# Patient Record
Sex: Male | Born: 1935 | Race: White | Hispanic: No | Marital: Single
Health system: Southern US, Community
[De-identification: ages and names within clinical notes are randomized; demographics above are authoritative.]

## PROBLEM LIST (undated history)

## (undated) DIAGNOSIS — G8929 Other chronic pain: Secondary | ICD-10-CM

## (undated) DIAGNOSIS — I509 Heart failure, unspecified: Secondary | ICD-10-CM

## (undated) DIAGNOSIS — D649 Anemia, unspecified: Secondary | ICD-10-CM

## (undated) DIAGNOSIS — Z8709 Personal history of other diseases of the respiratory system: Secondary | ICD-10-CM

## (undated) DIAGNOSIS — R1310 Dysphagia, unspecified: Secondary | ICD-10-CM

## (undated) DIAGNOSIS — E875 Hyperkalemia: Secondary | ICD-10-CM

## (undated) DIAGNOSIS — I6990 Unspecified sequelae of unspecified cerebrovascular disease: Secondary | ICD-10-CM

## (undated) DIAGNOSIS — J189 Pneumonia, unspecified organism: Secondary | ICD-10-CM

## (undated) DIAGNOSIS — M25561 Pain in right knee: Secondary | ICD-10-CM

## (undated) DIAGNOSIS — Z86718 Personal history of other venous thrombosis and embolism: Secondary | ICD-10-CM

## (undated) DIAGNOSIS — I10 Essential (primary) hypertension: Secondary | ICD-10-CM

## (undated) DIAGNOSIS — E46 Unspecified protein-calorie malnutrition: Secondary | ICD-10-CM

## (undated) DIAGNOSIS — H9193 Unspecified hearing loss, bilateral: Secondary | ICD-10-CM

## (undated) DIAGNOSIS — F1011 Alcohol abuse, in remission: Secondary | ICD-10-CM

## (undated) DIAGNOSIS — M25562 Pain in left knee: Secondary | ICD-10-CM

## (undated) DIAGNOSIS — N189 Chronic kidney disease, unspecified: Secondary | ICD-10-CM

## (undated) DIAGNOSIS — F0390 Unspecified dementia without behavioral disturbance: Secondary | ICD-10-CM

## (undated) DIAGNOSIS — I639 Cerebral infarction, unspecified: Secondary | ICD-10-CM

## (undated) DIAGNOSIS — I82409 Acute embolism and thrombosis of unspecified deep veins of unspecified lower extremity: Secondary | ICD-10-CM

## (undated) HISTORY — DX: Pain in left knee: M25.562

## (undated) HISTORY — DX: Pain in right knee: M25.561

## (undated) HISTORY — DX: Acute embolism and thrombosis of unspecified deep veins of unspecified lower extremity: I82.409

## (undated) HISTORY — DX: Unspecified dementia, unspecified severity, without behavioral disturbance, psychotic disturbance, mood disturbance, and anxiety: F03.90

## (undated) HISTORY — DX: Essential (primary) hypertension: I10

## (undated) HISTORY — DX: Cerebral infarction, unspecified: I63.9

## (undated) HISTORY — DX: Alcohol abuse, in remission: F10.11

## (undated) HISTORY — DX: Heart failure, unspecified: I50.9

## (undated) HISTORY — DX: Anemia, unspecified: D64.9

## (undated) HISTORY — DX: Personal history of other venous thrombosis and embolism: Z86.718

## (undated) HISTORY — DX: Other chronic pain: G89.29

## (undated) HISTORY — DX: Unspecified protein-calorie malnutrition: E46

## (undated) HISTORY — DX: Personal history of other diseases of the respiratory system: Z87.09

## (undated) HISTORY — DX: Unspecified sequelae of unspecified cerebrovascular disease: I69.90

## (undated) HISTORY — DX: Unspecified hearing loss, bilateral: H91.93

## (undated) HISTORY — DX: Pneumonia, unspecified organism: J18.9

## (undated) HISTORY — DX: Hyperkalemia: E87.5

## (undated) HISTORY — DX: Chronic kidney disease, unspecified: N18.9

## (undated) HISTORY — DX: Dysphagia, unspecified: R13.10

---

## 2009-01-03 ENCOUNTER — Ambulatory Visit: Payer: Self-pay | Admitting: *Deleted

## 2009-06-22 ENCOUNTER — Ambulatory Visit: Payer: Self-pay | Admitting: Nephrology

## 2009-07-06 ENCOUNTER — Ambulatory Visit: Payer: Self-pay | Admitting: Nephrology

## 2009-08-29 ENCOUNTER — Inpatient Hospital Stay: Payer: Self-pay | Admitting: Internal Medicine

## 2013-05-30 ENCOUNTER — Inpatient Hospital Stay: Payer: Self-pay | Admitting: Internal Medicine

## 2013-05-30 DIAGNOSIS — R000 Tachycardia, unspecified: Secondary | ICD-10-CM

## 2013-05-30 LAB — COMPREHENSIVE METABOLIC PANEL
Alkaline Phosphatase: 79 U/L (ref 50–136)
Anion Gap: 2 — ABNORMAL LOW (ref 7–16)
Calcium, Total: 8.2 mg/dL — ABNORMAL LOW (ref 8.5–10.1)
Co2: 28 mmol/L (ref 21–32)
Creatinine: 2.53 mg/dL — ABNORMAL HIGH (ref 0.60–1.30)
EGFR (African American): 26 — ABNORMAL LOW
Glucose: 82 mg/dL (ref 65–99)
Potassium: 4.2 mmol/L (ref 3.5–5.1)
SGOT(AST): 19 U/L (ref 15–37)
SGPT (ALT): 21 U/L (ref 12–78)

## 2013-05-30 LAB — CBC
MCHC: 31.9 g/dL — ABNORMAL LOW (ref 32.0–36.0)
Platelet: 183 10*3/uL (ref 150–440)
RBC: 3.78 10*6/uL — ABNORMAL LOW (ref 4.40–5.90)
RDW: 15.3 % — ABNORMAL HIGH (ref 11.5–14.5)

## 2013-05-30 LAB — MAGNESIUM: Magnesium: 1.8 mg/dL

## 2013-05-30 LAB — TROPONIN I: Troponin-I: <0.02 ng/mL

## 2013-05-30 LAB — AMMONIA: Ammonia, Plasma: <25 mcmol/L (ref 11–32)

## 2013-05-30 LAB — CK-MB: CK-MB: 1.1 ng/mL (ref 0.5–3.6)

## 2013-05-31 LAB — DRUG SCREEN, URINE
Amphetamines, Ur Screen: NEGATIVE
Barbiturates, Ur Screen: NEGATIVE
Benzodiazepine, Ur Scrn: POSITIVE
Cannabinoid 50 Ng, Ur ~~LOC~~: NEGATIVE
Cocaine Metabolite,Ur ~~LOC~~: NEGATIVE
MDMA (Ecstasy)Ur Screen: NEGATIVE
Methadone, Ur Screen: NEGATIVE
Opiate, Ur Screen: NEGATIVE
Phencyclidine (PCP) Ur S: NEGATIVE
Tricyclic, Ur Screen: NEGATIVE

## 2013-05-31 LAB — URINALYSIS, COMPLETE
Bacteria: NONE SEEN
Bilirubin,UR: NEGATIVE
Glucose,UR: NEGATIVE mg/dL
Ketone: NEGATIVE
Leukocyte Esterase: NEGATIVE
Nitrite: NEGATIVE
Ph: 5
Protein: 30
RBC,UR: 13 /HPF
Specific Gravity: 1.014
Squamous Epithelial: <1
WBC UR: 2 /HPF

## 2013-05-31 LAB — BASIC METABOLIC PANEL WITH GFR
Anion Gap: 2 — ABNORMAL LOW
BUN: 34 mg/dL — ABNORMAL HIGH
Calcium, Total: 8 mg/dL — ABNORMAL LOW
Chloride: 114 mmol/L — ABNORMAL HIGH
Co2: 28 mmol/L
Creatinine: 2.28 mg/dL — ABNORMAL HIGH
EGFR (African American): 29 — ABNORMAL LOW
EGFR (Non-African Amer.): 25 — ABNORMAL LOW
Glucose: 79 mg/dL
Osmolality: 293
Potassium: 4.4 mmol/L
Sodium: 144 mmol/L

## 2013-05-31 LAB — TSH: Thyroid Stimulating Horm: 0.368 u[IU]/mL — ABNORMAL LOW

## 2013-05-31 LAB — CBC WITH DIFFERENTIAL/PLATELET
Basophil #: 0 10*3/uL (ref 0.0–0.1)
HCT: 33.3 % — ABNORMAL LOW (ref 40.0–52.0)
Lymphocyte #: 1.3 10*3/uL (ref 1.0–3.6)
Lymphocyte %: 21.6 %
MCH: 28.2 pg (ref 26.0–34.0)
MCHC: 32.3 g/dL (ref 32.0–36.0)
Monocyte %: 10.2 %
Neutrophil %: 63.8 %
RDW: 15.1 % — ABNORMAL HIGH (ref 11.5–14.5)
WBC: 6.1 10*3/uL (ref 3.8–10.6)

## 2013-05-31 LAB — LIPID PANEL
Cholesterol: 148 mg/dL
HDL Cholesterol: 39 mg/dL — ABNORMAL LOW
Ldl Cholesterol, Calc: 83 mg/dL
Triglycerides: 132 mg/dL
VLDL Cholesterol, Calc: 26 mg/dL

## 2013-06-01 DIAGNOSIS — I359 Nonrheumatic aortic valve disorder, unspecified: Secondary | ICD-10-CM

## 2013-06-01 LAB — BASIC METABOLIC PANEL
Anion Gap: 7 (ref 7–16)
BUN: 31 mg/dL — ABNORMAL HIGH (ref 7–18)
Calcium, Total: 8.1 mg/dL — ABNORMAL LOW (ref 8.5–10.1)
Chloride: 113 mmol/L — ABNORMAL HIGH (ref 98–107)
Glucose: 158 mg/dL — ABNORMAL HIGH (ref 65–99)
Osmolality: 293 (ref 275–301)
Sodium: 142 mmol/L (ref 136–145)

## 2013-06-04 LAB — CULTURE, BLOOD (SINGLE)

## 2013-10-14 ENCOUNTER — Ambulatory Visit: Payer: Self-pay | Admitting: Hospice and Palliative Medicine

## 2013-10-21 ENCOUNTER — Inpatient Hospital Stay: Payer: Self-pay | Admitting: Internal Medicine

## 2013-10-21 LAB — COMPREHENSIVE METABOLIC PANEL
ALBUMIN: 2.9 g/dL — AB (ref 3.4–5.0)
ALT: 21 U/L (ref 12–78)
Alkaline Phosphatase: 81 U/L
Anion Gap: 5 — ABNORMAL LOW (ref 7–16)
BILIRUBIN TOTAL: 0.3 mg/dL (ref 0.2–1.0)
BUN: 32 mg/dL — AB (ref 7–18)
CALCIUM: 8.1 mg/dL — AB (ref 8.5–10.1)
CHLORIDE: 114 mmol/L — AB (ref 98–107)
CO2: 25 mmol/L (ref 21–32)
CREATININE: 2.45 mg/dL — AB (ref 0.60–1.30)
EGFR (African American): 27 — ABNORMAL LOW
GFR CALC NON AF AMER: 23 — AB
Glucose: 99 mg/dL (ref 65–99)
Osmolality: 294 (ref 275–301)
POTASSIUM: 4.5 mmol/L (ref 3.5–5.1)
SGOT(AST): 25 U/L (ref 15–37)
Sodium: 144 mmol/L (ref 136–145)
Total Protein: 6.7 g/dL (ref 6.4–8.2)

## 2013-10-21 LAB — URINALYSIS, COMPLETE
Bilirubin,UR: NEGATIVE
Glucose,UR: NEGATIVE mg/dL (ref 0–75)
KETONE: NEGATIVE
Leukocyte Esterase: NEGATIVE
NITRITE: NEGATIVE
PH: 5 (ref 4.5–8.0)
Protein: 100
Specific Gravity: 1.018 (ref 1.003–1.030)
Squamous Epithelial: 1

## 2013-10-21 LAB — CBC WITH DIFFERENTIAL/PLATELET
Basophil #: 0 10*3/uL (ref 0.0–0.1)
Basophil %: 0.3 %
EOS PCT: 0 %
Eosinophil #: 0 10*3/uL (ref 0.0–0.7)
HCT: 36.9 % — AB (ref 40.0–52.0)
HGB: 12 g/dL — ABNORMAL LOW (ref 13.0–18.0)
Lymphocyte #: 1 10*3/uL (ref 1.0–3.6)
Lymphocyte %: 11.1 %
MCH: 29.8 pg (ref 26.0–34.0)
MCHC: 32.7 g/dL (ref 32.0–36.0)
MCV: 91 fL (ref 80–100)
MONO ABS: 0.9 x10 3/mm (ref 0.2–1.0)
Monocyte %: 10.3 %
NEUTROS ABS: 6.8 10*3/uL — AB (ref 1.4–6.5)
Neutrophil %: 78.3 %
PLATELETS: 138 10*3/uL — AB (ref 150–440)
RBC: 4.04 10*6/uL — ABNORMAL LOW (ref 4.40–5.90)
RDW: 19.9 % — AB (ref 11.5–14.5)
WBC: 8.7 10*3/uL (ref 3.8–10.6)

## 2013-10-21 LAB — MAGNESIUM: Magnesium: 1.8 mg/dL

## 2013-10-21 LAB — PROTIME-INR
INR: 1.1
Prothrombin Time: 13.7 secs (ref 11.5–14.7)

## 2013-10-21 LAB — TROPONIN I: Troponin-I: <0.02 ng/mL

## 2013-10-21 LAB — PHOSPHORUS: Phosphorus: 3.5 mg/dL (ref 2.5–4.9)

## 2013-10-22 LAB — CBC WITH DIFFERENTIAL/PLATELET
Basophil #: 0 10*3/uL (ref 0.0–0.1)
Basophil %: 0.5 %
EOS ABS: 0 10*3/uL (ref 0.0–0.7)
Eosinophil %: 0.1 %
HCT: 33 % — AB (ref 40.0–52.0)
HGB: 10.6 g/dL — AB (ref 13.0–18.0)
LYMPHS ABS: 1.1 10*3/uL (ref 1.0–3.6)
LYMPHS PCT: 19.6 %
MCH: 29.6 pg (ref 26.0–34.0)
MCHC: 32.2 g/dL (ref 32.0–36.0)
MCV: 92 fL (ref 80–100)
MONOS PCT: 10.4 %
Monocyte #: 0.6 x10 3/mm (ref 0.2–1.0)
NEUTROS PCT: 69.4 %
Neutrophil #: 4 10*3/uL (ref 1.4–6.5)
Platelet: 106 10*3/uL — ABNORMAL LOW (ref 150–440)
RBC: 3.59 10*6/uL — ABNORMAL LOW (ref 4.40–5.90)
RDW: 20.3 % — ABNORMAL HIGH (ref 11.5–14.5)
WBC: 5.8 10*3/uL (ref 3.8–10.6)

## 2013-10-22 LAB — COMPREHENSIVE METABOLIC PANEL
ANION GAP: 6 — AB (ref 7–16)
Albumin: 2.5 g/dL — ABNORMAL LOW (ref 3.4–5.0)
Alkaline Phosphatase: 66 U/L
BILIRUBIN TOTAL: 0.3 mg/dL (ref 0.2–1.0)
BUN: 32 mg/dL — AB (ref 7–18)
CALCIUM: 7.6 mg/dL — AB (ref 8.5–10.1)
CO2: 22 mmol/L (ref 21–32)
Chloride: 114 mmol/L — ABNORMAL HIGH (ref 98–107)
Creatinine: 2.3 mg/dL — ABNORMAL HIGH (ref 0.60–1.30)
EGFR (Non-African Amer.): 25 — ABNORMAL LOW
GFR CALC AF AMER: 29 — AB
GLUCOSE: 80 mg/dL (ref 65–99)
Osmolality: 289 (ref 275–301)
Potassium: 4.5 mmol/L (ref 3.5–5.1)
SGOT(AST): 14 U/L — ABNORMAL LOW (ref 15–37)
SGPT (ALT): 16 U/L (ref 12–78)
SODIUM: 142 mmol/L (ref 136–145)
Total Protein: 5.7 g/dL — ABNORMAL LOW (ref 6.4–8.2)

## 2013-10-22 LAB — MAGNESIUM: Magnesium: 1.8 mg/dL

## 2013-10-22 LAB — PRO B NATRIURETIC PEPTIDE: B-TYPE NATIURETIC PEPTID: 3199 pg/mL — AB (ref 0–450)

## 2013-10-23 LAB — URINE CULTURE

## 2013-10-24 LAB — BASIC METABOLIC PANEL
Anion Gap: 6 — ABNORMAL LOW (ref 7–16)
BUN: 41 mg/dL — AB (ref 7–18)
CO2: 24 mmol/L (ref 21–32)
CREATININE: 2.34 mg/dL — AB (ref 0.60–1.30)
Calcium, Total: 8.5 mg/dL (ref 8.5–10.1)
Chloride: 117 mmol/L — ABNORMAL HIGH (ref 98–107)
EGFR (Non-African Amer.): 24 — ABNORMAL LOW
GFR CALC AF AMER: 28 — AB
Glucose: 95 mg/dL (ref 65–99)
Osmolality: 302 (ref 275–301)
POTASSIUM: 4.4 mmol/L (ref 3.5–5.1)
Sodium: 147 mmol/L — ABNORMAL HIGH (ref 136–145)

## 2013-10-24 LAB — WBC: WBC: 6 10*3/uL (ref 3.8–10.6)

## 2013-10-25 LAB — PLATELET COUNT: PLATELETS: 170 10*3/uL (ref 150–440)

## 2013-10-26 ENCOUNTER — Inpatient Hospital Stay
Admission: AD | Admit: 2013-10-26 | Discharge: 2013-11-16 | Disposition: A | Discharge status: Skilled Nursing Facility | Payer: Self-pay | Source: Ambulatory Visit | Attending: Internal Medicine | Admitting: Internal Medicine

## 2013-10-26 ENCOUNTER — Ambulatory Visit (HOSPITAL_COMMUNITY)
Admission: AD | Admit: 2013-10-26 | Discharge: 2013-10-26 | Disposition: A | Discharge status: Home / Self Care | Payer: Medicare Other | Source: Other Acute Inpatient Hospital | Attending: Internal Medicine | Admitting: Internal Medicine

## 2013-10-26 DIAGNOSIS — N179 Acute kidney failure, unspecified: Secondary | ICD-10-CM | POA: Diagnosis present

## 2013-10-26 LAB — CULTURE, BLOOD (SINGLE)

## 2013-10-27 ENCOUNTER — Other Ambulatory Visit (HOSPITAL_COMMUNITY): Payer: Self-pay

## 2013-10-27 LAB — COMPREHENSIVE METABOLIC PANEL
ALT: 9 U/L (ref 0–53)
AST: 11 U/L (ref 0–37)
Albumin: 2.6 g/dL — ABNORMAL LOW (ref 3.5–5.2)
Alkaline Phosphatase: 63 U/L (ref 39–117)
BUN: 70 mg/dL — ABNORMAL HIGH (ref 6–23)
CALCIUM: 8.5 mg/dL (ref 8.4–10.5)
CO2: 26 mEq/L (ref 19–32)
Chloride: 111 mEq/L (ref 96–112)
Creatinine, Ser: 3.05 mg/dL — ABNORMAL HIGH (ref 0.50–1.35)
GFR calc non Af Amer: 17 mL/min — ABNORMAL LOW (ref >90)
GFR, EST AFRICAN AMERICAN: 20 mL/min — AB (ref >90)
Glucose, Bld: 107 mg/dL — ABNORMAL HIGH (ref 70–99)
Potassium: 4.7 mEq/L (ref 3.7–5.3)
SODIUM: 150 meq/L — AB (ref 137–147)
TOTAL PROTEIN: 6.1 g/dL (ref 6.0–8.3)
Total Bilirubin: <0.2 mg/dL — ABNORMAL LOW (ref 0.3–1.2)

## 2013-10-27 LAB — CBC
HCT: 35.5 % — ABNORMAL LOW (ref 39.0–52.0)
Hemoglobin: 11.4 g/dL — ABNORMAL LOW (ref 13.0–17.0)
MCH: 29.5 pg (ref 26.0–34.0)
MCHC: 32.1 g/dL (ref 30.0–36.0)
MCV: 91.7 fL (ref 78.0–100.0)
Platelets: 188 10*3/uL (ref 150–400)
RBC: 3.87 MIL/uL — ABNORMAL LOW (ref 4.22–5.81)
RDW: 18.8 % — ABNORMAL HIGH (ref 11.5–15.5)
WBC: 6.9 10*3/uL (ref 4.0–10.5)

## 2013-10-27 LAB — TSH: TSH: 0.319 u[IU]/mL — ABNORMAL LOW (ref 0.350–4.500)

## 2013-10-28 ENCOUNTER — Other Ambulatory Visit (HOSPITAL_COMMUNITY): Payer: Self-pay

## 2013-10-28 LAB — CBC WITH DIFFERENTIAL/PLATELET
BASOS ABS: 0 10*3/uL (ref 0.0–0.1)
BASOS PCT: 0 % (ref 0–1)
EOS PCT: 3 % (ref 0–5)
Eosinophils Absolute: 0.2 10*3/uL (ref 0.0–0.7)
HCT: 36.1 % — ABNORMAL LOW (ref 39.0–52.0)
HEMOGLOBIN: 11.5 g/dL — AB (ref 13.0–17.0)
Lymphocytes Relative: 26 % (ref 12–46)
Lymphs Abs: 1.9 10*3/uL (ref 0.7–4.0)
MCH: 29.1 pg (ref 26.0–34.0)
MCHC: 31.9 g/dL (ref 30.0–36.0)
MCV: 91.4 fL (ref 78.0–100.0)
Monocytes Absolute: 0.9 10*3/uL (ref 0.1–1.0)
Monocytes Relative: 12 % (ref 3–12)
Neutro Abs: 4.2 10*3/uL (ref 1.7–7.7)
Neutrophils Relative %: 59 % (ref 43–77)
PLATELETS: 197 10*3/uL (ref 150–400)
RBC: 3.95 MIL/uL — AB (ref 4.22–5.81)
RDW: 18.4 % — ABNORMAL HIGH (ref 11.5–15.5)
WBC: 7.2 10*3/uL (ref 4.0–10.5)

## 2013-10-28 LAB — VITAMIN B12: Vitamin B-12: 304 pg/mL (ref 211–911)

## 2013-10-28 LAB — MAGNESIUM: MAGNESIUM: 2.1 mg/dL (ref 1.5–2.5)

## 2013-10-28 LAB — BASIC METABOLIC PANEL
BUN: 79 mg/dL — ABNORMAL HIGH (ref 6–23)
CALCIUM: 8.7 mg/dL (ref 8.4–10.5)
CO2: 24 mEq/L (ref 19–32)
CREATININE: 3.09 mg/dL — AB (ref 0.50–1.35)
Chloride: 105 mEq/L (ref 96–112)
GFR, EST AFRICAN AMERICAN: 20 mL/min — AB (ref >90)
GFR, EST NON AFRICAN AMERICAN: 17 mL/min — AB (ref >90)
Glucose, Bld: 94 mg/dL (ref 70–99)
Potassium: 4.2 mEq/L (ref 3.7–5.3)
SODIUM: 146 meq/L (ref 137–147)

## 2013-10-28 LAB — PHOSPHORUS: Phosphorus: 4.2 mg/dL (ref 2.3–4.6)

## 2013-10-28 LAB — FERRITIN: FERRITIN: 76 ng/mL (ref 22–322)

## 2013-10-28 LAB — FOLATE RBC: RBC Folate: 672 ng/mL — ABNORMAL HIGH (ref >280)

## 2013-10-28 LAB — T4, FREE: Free T4: 1.41 ng/dL (ref 0.80–1.80)

## 2013-10-28 LAB — TSH: TSH: 0.252 u[IU]/mL — ABNORMAL LOW (ref 0.350–4.500)

## 2013-10-28 LAB — PREALBUMIN: Prealbumin: 14 mg/dL — ABNORMAL LOW (ref 17.0–34.0)

## 2013-10-28 LAB — PRO B NATRIURETIC PEPTIDE: Pro B Natriuretic peptide (BNP): 2378 pg/mL — ABNORMAL HIGH (ref 0–450)

## 2013-10-30 LAB — CBC
HEMATOCRIT: 37.9 % — AB (ref 39.0–52.0)
Hemoglobin: 11.9 g/dL — ABNORMAL LOW (ref 13.0–17.0)
MCH: 28.7 pg (ref 26.0–34.0)
MCHC: 31.4 g/dL (ref 30.0–36.0)
MCV: 91.5 fL (ref 78.0–100.0)
Platelets: 209 10*3/uL (ref 150–400)
RBC: 4.14 MIL/uL — AB (ref 4.22–5.81)
RDW: 18.4 % — ABNORMAL HIGH (ref 11.5–15.5)
WBC: 7.6 10*3/uL (ref 4.0–10.5)

## 2013-10-30 LAB — BASIC METABOLIC PANEL
BUN: 93 mg/dL — AB (ref 6–23)
CO2: 26 meq/L (ref 19–32)
Calcium: 8.9 mg/dL (ref 8.4–10.5)
Chloride: 104 mEq/L (ref 96–112)
Creatinine, Ser: 3.74 mg/dL — ABNORMAL HIGH (ref 0.50–1.35)
GFR calc Af Amer: 16 mL/min — ABNORMAL LOW (ref >90)
GFR, EST NON AFRICAN AMERICAN: 13 mL/min — AB (ref >90)
GLUCOSE: 93 mg/dL (ref 70–99)
Potassium: 5.1 mEq/L (ref 3.7–5.3)
Sodium: 145 mEq/L (ref 137–147)

## 2013-10-31 LAB — BASIC METABOLIC PANEL
BUN: 92 mg/dL — ABNORMAL HIGH (ref 6–23)
CO2: 25 mEq/L (ref 19–32)
CREATININE: 3.74 mg/dL — AB (ref 0.50–1.35)
Calcium: 8.8 mg/dL (ref 8.4–10.5)
Chloride: 104 mEq/L (ref 96–112)
GFR, EST AFRICAN AMERICAN: 16 mL/min — AB (ref >90)
GFR, EST NON AFRICAN AMERICAN: 13 mL/min — AB (ref >90)
GLUCOSE: 91 mg/dL (ref 70–99)
Potassium: 5.8 mEq/L — ABNORMAL HIGH (ref 3.7–5.3)
Sodium: 144 mEq/L (ref 137–147)

## 2013-11-01 LAB — URINALYSIS, ROUTINE W REFLEX MICROSCOPIC
BILIRUBIN URINE: NEGATIVE
GLUCOSE, UA: NEGATIVE mg/dL
Hgb urine dipstick: NEGATIVE
KETONES UR: NEGATIVE mg/dL
LEUKOCYTES UA: NEGATIVE
Nitrite: NEGATIVE
PH: 6.5 (ref 5.0–8.0)
Protein, ur: NEGATIVE mg/dL
Specific Gravity, Urine: 1.011 (ref 1.005–1.030)
Urobilinogen, UA: 0.2 mg/dL (ref 0.0–1.0)

## 2013-11-01 LAB — BASIC METABOLIC PANEL
BUN: 97 mg/dL — ABNORMAL HIGH (ref 6–23)
CALCIUM: 9 mg/dL (ref 8.4–10.5)
CO2: 26 meq/L (ref 19–32)
CREATININE: 4.34 mg/dL — AB (ref 0.50–1.35)
Chloride: 101 mEq/L (ref 96–112)
GFR calc non Af Amer: 11 mL/min — ABNORMAL LOW (ref >90)
GFR, EST AFRICAN AMERICAN: 13 mL/min — AB (ref >90)
Glucose, Bld: 115 mg/dL — ABNORMAL HIGH (ref 70–99)
Potassium: 5.2 mEq/L (ref 3.7–5.3)
Sodium: 143 mEq/L (ref 137–147)

## 2013-11-02 ENCOUNTER — Other Ambulatory Visit (HOSPITAL_COMMUNITY): Payer: Self-pay

## 2013-11-02 LAB — CBC WITH DIFFERENTIAL/PLATELET
Basophils Absolute: 0 10*3/uL (ref 0.0–0.1)
Basophils Relative: 0 % (ref 0–1)
Eosinophils Absolute: 0.1 10*3/uL (ref 0.0–0.7)
Eosinophils Relative: 1 % (ref 0–5)
HCT: 35.5 % — ABNORMAL LOW (ref 39.0–52.0)
Hemoglobin: 11.3 g/dL — ABNORMAL LOW (ref 13.0–17.0)
Lymphocytes Relative: 9 % — ABNORMAL LOW (ref 12–46)
Lymphs Abs: 1.1 10*3/uL (ref 0.7–4.0)
MCH: 29.1 pg (ref 26.0–34.0)
MCHC: 31.8 g/dL (ref 30.0–36.0)
MCV: 91.5 fL (ref 78.0–100.0)
Monocytes Absolute: 0.9 10*3/uL (ref 0.1–1.0)
Monocytes Relative: 8 % (ref 3–12)
Neutro Abs: 9.4 10*3/uL — ABNORMAL HIGH (ref 1.7–7.7)
Neutrophils Relative %: 82 % — ABNORMAL HIGH (ref 43–77)
Platelets: 194 10*3/uL (ref 150–400)
RBC: 3.88 MIL/uL — ABNORMAL LOW (ref 4.22–5.81)
RDW: 18.4 % — ABNORMAL HIGH (ref 11.5–15.5)
WBC: 11.4 10*3/uL — ABNORMAL HIGH (ref 4.0–10.5)

## 2013-11-02 LAB — COMPREHENSIVE METABOLIC PANEL
ALT: 12 U/L (ref 0–53)
AST: 14 U/L (ref 0–37)
Albumin: 2.8 g/dL — ABNORMAL LOW (ref 3.5–5.2)
Alkaline Phosphatase: 72 U/L (ref 39–117)
BUN: 97 mg/dL — ABNORMAL HIGH (ref 6–23)
CO2: 25 mEq/L (ref 19–32)
Calcium: 8.5 mg/dL (ref 8.4–10.5)
Chloride: 100 mEq/L (ref 96–112)
Creatinine, Ser: 4.37 mg/dL — ABNORMAL HIGH (ref 0.50–1.35)
GFR calc Af Amer: 13 mL/min — ABNORMAL LOW (ref >90)
GFR calc non Af Amer: 11 mL/min — ABNORMAL LOW (ref >90)
Glucose, Bld: 91 mg/dL (ref 70–99)
Potassium: 6.1 mEq/L — ABNORMAL HIGH (ref 3.7–5.3)
Sodium: 142 mEq/L (ref 137–147)
Total Bilirubin: <0.2 mg/dL — ABNORMAL LOW (ref 0.3–1.2)
Total Protein: 6.2 g/dL (ref 6.0–8.3)

## 2013-11-02 LAB — URINE CULTURE
CULTURE: NO GROWTH
Colony Count: NO GROWTH

## 2013-11-02 LAB — POTASSIUM: Potassium: 5.9 mEq/L — ABNORMAL HIGH (ref 3.7–5.3)

## 2013-11-02 LAB — PREALBUMIN: Prealbumin: 14.8 mg/dL — ABNORMAL LOW (ref 17.0–34.0)

## 2013-11-03 ENCOUNTER — Other Ambulatory Visit (HOSPITAL_COMMUNITY): Payer: Self-pay

## 2013-11-03 LAB — BASIC METABOLIC PANEL
BUN: 96 mg/dL — ABNORMAL HIGH (ref 6–23)
CALCIUM: 8.6 mg/dL (ref 8.4–10.5)
CO2: 27 mEq/L (ref 19–32)
Chloride: 102 mEq/L (ref 96–112)
Creatinine, Ser: 4.61 mg/dL — ABNORMAL HIGH (ref 0.50–1.35)
GFR calc Af Amer: 12 mL/min — ABNORMAL LOW (ref >90)
GFR calc non Af Amer: 10 mL/min — ABNORMAL LOW (ref >90)
GLUCOSE: 100 mg/dL — AB (ref 70–99)
Potassium: 4.8 mEq/L (ref 3.7–5.3)
SODIUM: 147 meq/L (ref 137–147)

## 2013-11-03 LAB — RENAL FUNCTION PANEL
Albumin: 2.6 g/dL — ABNORMAL LOW (ref 3.5–5.2)
BUN: 97 mg/dL — ABNORMAL HIGH (ref 6–23)
CHLORIDE: 102 meq/L (ref 96–112)
CO2: 27 mEq/L (ref 19–32)
CREATININE: 4.59 mg/dL — AB (ref 0.50–1.35)
Calcium: 8.7 mg/dL (ref 8.4–10.5)
GFR calc Af Amer: 12 mL/min — ABNORMAL LOW (ref >90)
GFR calc non Af Amer: 11 mL/min — ABNORMAL LOW (ref >90)
Glucose, Bld: 101 mg/dL — ABNORMAL HIGH (ref 70–99)
Phosphorus: 7.8 mg/dL — ABNORMAL HIGH (ref 2.3–4.6)
Potassium: 4.7 mEq/L (ref 3.7–5.3)
Sodium: 146 mEq/L (ref 137–147)

## 2013-11-03 LAB — CBC
HCT: 35.6 % — ABNORMAL LOW (ref 39.0–52.0)
Hemoglobin: 11.4 g/dL — ABNORMAL LOW (ref 13.0–17.0)
MCH: 29.2 pg (ref 26.0–34.0)
MCHC: 32 g/dL (ref 30.0–36.0)
MCV: 91.3 fL (ref 78.0–100.0)
Platelets: 185 10*3/uL (ref 150–400)
RBC: 3.9 MIL/uL — AB (ref 4.22–5.81)
RDW: 18.5 % — AB (ref 11.5–15.5)
WBC: 10.1 10*3/uL (ref 4.0–10.5)

## 2013-11-03 LAB — PROCALCITONIN: Procalcitonin: 0.34 ng/mL

## 2013-11-03 MED ORDER — HEPARIN SODIUM (PORCINE) 1000 UNIT/ML IJ SOLN
INTRAMUSCULAR | Status: AC
  Filled 2013-11-03: qty 1

## 2013-11-03 MED ORDER — IOHEXOL 300 MG/ML  SOLN: 100.0000 mL | Freq: Once | INTRAMUSCULAR | Status: AC | PRN

## 2013-11-03 NOTE — Procedures (Signed)
Successful placement of temporary HD catheter with tips terminating within the superior aspect of the right atrium.   The patient tolerated the procedure well without immediate post procedural complication. The catheter is ready for immediate use.  

## 2013-11-03 NOTE — H&P (Signed)
Elijah Ramos is an 80 y.o. male.   Chief Complaint: Pneumonia; sepsis; encephalopathy Hx Chronic kidney disease Worsening renal function since hospitalization Atrophic L kidney Renal MD has been consulted and has recommended trial of dialysis If no improvement of functions--may consider hospice Now scheduled for temporary dialysis catheter  HPI: PNA; sepsis; confusion; deaf; CHF; CKD; HTN  No past medical history on file.  No past surgical history on file.  No family history on file. Social History:  has no tobacco, alcohol, and drug history on file.  Allergies: Allergies not on file  No prescriptions prior to admission    Results for orders placed during the hospital encounter of 10/26/13 (from the past 48 hour(s))  URINALYSIS, ROUTINE W REFLEX MICROSCOPIC     Status: None   Collection Time    11/01/13  1:19 PM      Result Value Ref Range   Color, Urine YELLOW  YELLOW   APPearance CLEAR  CLEAR   Specific Gravity, Urine 1.011  1.005 - 1.030   pH 6.5  5.0 - 8.0   Glucose, UA NEGATIVE  NEGATIVE mg/dL   Hgb urine dipstick NEGATIVE  NEGATIVE   Bilirubin Urine NEGATIVE  NEGATIVE   Ketones, ur NEGATIVE  NEGATIVE mg/dL   Protein, ur NEGATIVE  NEGATIVE mg/dL   Urobilinogen, UA 0.2  0.0 - 1.0 mg/dL   Nitrite NEGATIVE  NEGATIVE   Leukocytes, UA NEGATIVE  NEGATIVE   Comment: MICROSCOPIC NOT DONE ON URINES WITH NEGATIVE PROTEIN, BLOOD, LEUKOCYTES, NITRITE, OR GLUCOSE <1000 mg/dL.  URINE CULTURE     Status: None   Collection Time    11/01/13  1:19 PM      Result Value Ref Range   Specimen Description URINE, RANDOM     Special Requests NONE     Culture  Setup Time       Value: 11/01/2013 21:53     Performed at [facility]       Value: NO GROWTH     Performed at [facility]   Culture       Value: NO GROWTH     Performed at [facility]   Report Status 11/02/2013 FINAL    CBC WITH DIFFERENTIAL     Status: Abnormal   Collection Time     11/02/13  5:03 AM      Result Value Ref Range   WBC 11.4 (*) 4.0 - 10.5 K/uL   RBC 3.88 (*) 4.22 - 5.81 MIL/uL   Hemoglobin 11.3 (*) 13.0 - 17.0 g/dL   HCT 35.5 (*) 39.0 - 52.0 %   MCV 91.5  78.0 - 100.0 fL   MCH 29.1  26.0 - 34.0 pg   MCHC 31.8  30.0 - 36.0 g/dL   RDW 18.4 (*) 11.5 - 15.5 %   Platelets 194  150 - 400 K/uL   Neutrophils Relative % 82 (*) 43 - 77 %   Neutro Abs 9.4 (*) 1.7 - 7.7 K/uL   Lymphocytes Relative 9 (*) 12 - 46 %   Lymphs Abs 1.1  0.7 - 4.0 K/uL   Monocytes Relative 8  3 - 12 %   Monocytes Absolute 0.9  0.1 - 1.0 K/uL   Eosinophils Relative 1  0 - 5 %   Eosinophils Absolute 0.1  0.0 - 0.7 K/uL   Basophils Relative 0  0 - 1 %   Basophils Absolute 0.0  0.0 - 0.1 K/uL  COMPREHENSIVE METABOLIC PANEL  Status: Abnormal   Collection Time    11/02/13  5:03 AM      Result Value Ref Range   Sodium 142  137 - 147 mEq/L   Potassium 6.1 (*) 3.7 - 5.3 mEq/L   Chloride 100  96 - 112 mEq/L   CO2 25  19 - 32 mEq/L   Glucose, Bld 91  70 - 99 mg/dL   BUN 97 (*) 6 - 23 mg/dL   Creatinine, Ser 4.37 (*) 0.50 - 1.35 mg/dL   Calcium 8.5  8.4 - 10.5 mg/dL   Total Protein 6.2  6.0 - 8.3 g/dL   Albumin 2.8 (*) 3.5 - 5.2 g/dL   AST 14  0 - 37 U/L   ALT 12  0 - 53 U/L   Alkaline Phosphatase 72  39 - 117 U/L   Total Bilirubin <0.2 (*) 0.3 - 1.2 mg/dL   GFR calc non Af Amer 11 (*) >90 mL/min   GFR calc Af Amer 13 (*) >90 mL/min   Comment: (NOTE)     The eGFR has been calculated using the CKD EPI equation.     This calculation has not been validated in all clinical situations.     eGFR's persistently <90 mL/min signify possible Chronic Kidney     Disease.  PREALBUMIN     Status: Abnormal   Collection Time    11/02/13  5:03 AM      Result Value Ref Range   Prealbumin 14.8 (*) 17.0 - 34.0 mg/dL   Comment: Performed at [facility]     Status: Abnormal   Collection Time    11/02/13  7:00 PM      Result Value Ref Range   Potassium 5.9 (*) 3.7 -  5.3 mEq/L  BASIC METABOLIC PANEL     Status: Abnormal   Collection Time    11/03/13  6:50 AM      Result Value Ref Range   Sodium 147  137 - 147 mEq/L   Potassium 4.8  3.7 - 5.3 mEq/L   Chloride 102  96 - 112 mEq/L   CO2 27  19 - 32 mEq/L   Glucose, Bld 100 (*) 70 - 99 mg/dL   BUN 96 (*) 6 - 23 mg/dL   Creatinine, Ser 4.61 (*) 0.50 - 1.35 mg/dL   Calcium 8.6  8.4 - 10.5 mg/dL   GFR calc non Af Amer 10 (*) >90 mL/min   GFR calc Af Amer 12 (*) >90 mL/min   Comment: (NOTE)     The eGFR has been calculated using the CKD EPI equation.     This calculation has not been validated in all clinical situations.     eGFR's persistently <90 mL/min signify possible Chronic Kidney     Disease.  PROCALCITONIN     Status: None   Collection Time    11/03/13  6:50 AM      Result Value Ref Range   Procalcitonin 0.34     Comment:            Interpretation:     PCT (Procalcitonin) <= 0.5 ng/mL:     Systemic infection (sepsis) is not likely.     Local bacterial infection is possible.     (NOTE)             ICU PCT Algorithm               Non ICU PCT Algorithm        ----------------------------     ------------------------------  PCT < 0.25 ng/mL                 PCT < 0.1 ng/mL         Stopping of antibiotics            Stopping of antibiotics           strongly encouraged.               strongly encouraged.        ----------------------------     ------------------------------           PCT level decrease by               PCT < 0.25 ng/mL           >= 80% from peak PCT           OR PCT 0.25 - 0.5 ng/mL          Stopping of antibiotics                                                 encouraged.         Stopping of antibiotics               encouraged.        ----------------------------     ------------------------------           PCT level decrease by              PCT >= 0.25 ng/mL           < 80% from peak PCT            AND PCT >= 0.5 ng/mL            Continuing antibiotics                                                   encouraged.           Continuing antibiotics                encouraged.        ----------------------------     ------------------------------         PCT level increase compared          PCT > 0.5 ng/mL             with peak PCT AND              PCT >= 0.5 ng/mL             Escalation of antibiotics                                              strongly encouraged.          Escalation of antibiotics            strongly encouraged.  CBC     Status: Abnormal   Collection Time    11/03/13  6:50 AM      Result Value Ref Range   WBC 10.1  4.0 -  10.5 K/uL   RBC 3.90 (*) 4.22 - 5.81 MIL/uL   Hemoglobin 11.4 (*) 13.0 - 17.0 g/dL   HCT 35.6 (*) 39.0 - 52.0 %   MCV 91.3  78.0 - 100.0 fL   MCH 29.2  26.0 - 34.0 pg   MCHC 32.0  30.0 - 36.0 g/dL   RDW 18.5 (*) 11.5 - 15.5 %   Platelets 185  150 - 400 K/uL  RENAL FUNCTION PANEL     Status: Abnormal   Collection Time    11/03/13  6:50 AM      Result Value Ref Range   Sodium 146  137 - 147 mEq/L   Potassium 4.7  3.7 - 5.3 mEq/L   Chloride 102  96 - 112 mEq/L   CO2 27  19 - 32 mEq/L   Glucose, Bld 101 (*) 70 - 99 mg/dL   BUN 97 (*) 6 - 23 mg/dL   Creatinine, Ser 4.59 (*) 0.50 - 1.35 mg/dL   Calcium 8.7  8.4 - 10.5 mg/dL   Phosphorus 7.8 (*) 2.3 - 4.6 mg/dL   Albumin 2.6 (*) 3.5 - 5.2 g/dL   GFR calc non Af Amer 11 (*) >90 mL/min   GFR calc Af Amer 12 (*) >90 mL/min   Comment: (NOTE)     The eGFR has been calculated using the CKD EPI equation.     This calculation has not been validated in all clinical situations.     eGFR's persistently <90 mL/min signify possible Chronic Kidney     Disease.   US Renal  11/02/2013   CLINICAL DATA:  Renal disease.  EXAM: RENAL/URINARY TRACT ULTRASOUND COMPLETE  COMPARISON:  CT ABD-PELV W/O CM dated 07/06/2009  FINDINGS: Right Kidney:  Length: 8.2 cm. Right kidney is echogenic consistent chronic renal disease. A 3.5 x 3.5 x 3.3 cm hypoechoic lesion right upper  pole noted most consistent with a cyst. An adjacent 1.9 x 1.5 x 0.7 cm hypoechoic density is present again most likely a cyst. These correspond to a simple cyst and hyperdense cyst noted in the regions of the right kidney on prior CT of 2010.  Left Kidney:  Length: 4.5 cm. Left in the past atrophy from prior CT scan. Left kidney is echodense consistent with chronic renal disease.  Bladder:  Bladder is nondistended. The bladder wall however is irregular. Bladder pathology including cystitis and/or malignancy cannot be excluded.  IMPRESSION: 1. Bilateral echogenic kidneys with severe left renal atrophy. These findings consistent with chronic renal disease. Cysts present in the left kidney stable from prior CT of 2010. 2. Bladder wall irregularity. Bladder wall pathology including cystitis and/or malignancy cannot be excluded.   Electronically Signed   By: Marcello Moores  Register   On: 11/02/2013 17:17   Dg Chest Port 1 View  11/03/2013   CLINICAL DATA:  Pneumonia.  EXAM: PORTABLE CHEST - 1 VIEW  COMPARISON:  DG CHEST 1V PORT dated 10/28/2013  FINDINGS: Interval removal of right IJ line. Persistent atelectasis and/or infiltrates lung bases. Stable cardiomegaly. Pulmonary vascularity normal. Calcified pleural plaques again noted on the left. No pneumothorax. No acute osseous abnormality.  IMPRESSION: 1. Right IJ line in stable position. 2.  Persistent bibasilar atelectasis and/or infiltrates, no change. 3. Stable cardiomegaly. 4. Stable calcified pleural plaques on the left.   Electronically Signed   By: Marcello Moores  Register   On: 11/03/2013 07:57    Review of Systems  Constitutional: Positive for weight loss. Negative for fever.  HENT: Positive for  hearing loss.        Deaf  Cardiovascular: Negative for chest pain.  Gastrointestinal: Negative for nausea, vomiting and abdominal pain.  Neurological: Positive for weakness.    There were no vitals taken for this visit. Physical Exam  Cardiovascular: Normal rate and  regular rhythm.   No murmur heard. Respiratory: Effort normal. He has wheezes.  GI: Soft.  Musculoskeletal:  Able to move extrs; but slow to follow commands  Neurological:  Pleasant; deaf Responds with nods  Skin: Skin is warm.  Psychiatric:  Consented son via phone     Assessment/Plan Hx CKD Worsening renal fxn Pna; sepsis  Renal MD- Rec: trial of dialysis Son is aware of procedure benefits and risks and agreeable to proceed Consent signed andin chart  Lavonia Drafts 11/03/2013, 11:17 AM

## 2013-11-04 LAB — RENAL FUNCTION PANEL
ALBUMIN: 2.6 g/dL — AB (ref 3.5–5.2)
BUN: 75 mg/dL — AB (ref 6–23)
CHLORIDE: 103 meq/L (ref 96–112)
CO2: 23 mEq/L (ref 19–32)
Calcium: 8.5 mg/dL (ref 8.4–10.5)
Creatinine, Ser: 3.83 mg/dL — ABNORMAL HIGH (ref 0.50–1.35)
GFR calc non Af Amer: 13 mL/min — ABNORMAL LOW (ref >90)
GFR, EST AFRICAN AMERICAN: 15 mL/min — AB (ref >90)
Glucose, Bld: 77 mg/dL (ref 70–99)
POTASSIUM: 4.2 meq/L (ref 3.7–5.3)
Phosphorus: 7 mg/dL — ABNORMAL HIGH (ref 2.3–4.6)
SODIUM: 146 meq/L (ref 137–147)

## 2013-11-04 LAB — PROCALCITONIN: PROCALCITONIN: 0.39 ng/mL

## 2013-11-04 LAB — CBC
HCT: 32.7 % — ABNORMAL LOW (ref 39.0–52.0)
HEMOGLOBIN: 10.4 g/dL — AB (ref 13.0–17.0)
MCH: 29.1 pg (ref 26.0–34.0)
MCHC: 31.8 g/dL (ref 30.0–36.0)
MCV: 91.6 fL (ref 78.0–100.0)
Platelets: 166 10*3/uL (ref 150–400)
RBC: 3.57 MIL/uL — ABNORMAL LOW (ref 4.22–5.81)
RDW: 18.3 % — AB (ref 11.5–15.5)
WBC: 8.9 10*3/uL (ref 4.0–10.5)

## 2013-11-04 LAB — HEPATITIS B CORE ANTIBODY, TOTAL: Hep B Core Total Ab: NONREACTIVE

## 2013-11-04 LAB — HEPATITIS B SURFACE ANTIBODY,QUALITATIVE: HEP B S AB: NEGATIVE

## 2013-11-04 LAB — HEPATITIS B SURFACE ANTIGEN: HEP B S AG: NEGATIVE

## 2013-11-05 LAB — CBC
HCT: 37.2 % — ABNORMAL LOW (ref 39.0–52.0)
HEMOGLOBIN: 11.8 g/dL — AB (ref 13.0–17.0)
MCH: 29.6 pg (ref 26.0–34.0)
MCHC: 31.7 g/dL (ref 30.0–36.0)
MCV: 93.5 fL (ref 78.0–100.0)
PLATELETS: 161 10*3/uL (ref 150–400)
RBC: 3.98 MIL/uL — ABNORMAL LOW (ref 4.22–5.81)
RDW: 18.1 % — ABNORMAL HIGH (ref 11.5–15.5)
WBC: 8.3 10*3/uL (ref 4.0–10.5)

## 2013-11-05 LAB — BASIC METABOLIC PANEL
BUN: 48 mg/dL — ABNORMAL HIGH (ref 6–23)
CO2: 26 mEq/L (ref 19–32)
Calcium: 8.7 mg/dL (ref 8.4–10.5)
Chloride: 102 mEq/L (ref 96–112)
Creatinine, Ser: 3.29 mg/dL — ABNORMAL HIGH (ref 0.50–1.35)
GFR calc non Af Amer: 16 mL/min — ABNORMAL LOW (ref >90)
GFR, EST AFRICAN AMERICAN: 18 mL/min — AB (ref >90)
GLUCOSE: 93 mg/dL (ref 70–99)
POTASSIUM: 4.5 meq/L (ref 3.7–5.3)
Sodium: 143 mEq/L (ref 137–147)

## 2013-11-06 ENCOUNTER — Other Ambulatory Visit (HOSPITAL_COMMUNITY): Payer: Self-pay

## 2013-11-06 LAB — RENAL FUNCTION PANEL
Albumin: 2.8 g/dL — ABNORMAL LOW (ref 3.5–5.2)
BUN: 20 mg/dL (ref 6–23)
CALCIUM: 8.4 mg/dL (ref 8.4–10.5)
CO2: 31 meq/L (ref 19–32)
Chloride: 100 mEq/L (ref 96–112)
Creatinine, Ser: 2.43 mg/dL — ABNORMAL HIGH (ref 0.50–1.35)
GFR calc Af Amer: 26 mL/min — ABNORMAL LOW (ref >90)
GFR, EST NON AFRICAN AMERICAN: 23 mL/min — AB (ref >90)
Glucose, Bld: 95 mg/dL (ref 70–99)
Phosphorus: 4 mg/dL (ref 2.3–4.6)
Potassium: 4.5 mEq/L (ref 3.7–5.3)
SODIUM: 142 meq/L (ref 137–147)

## 2013-11-06 LAB — CBC
HCT: 34.7 % — ABNORMAL LOW (ref 39.0–52.0)
HCT: 36.7 % — ABNORMAL LOW (ref 39.0–52.0)
Hemoglobin: 11 g/dL — ABNORMAL LOW (ref 13.0–17.0)
Hemoglobin: 11.5 g/dL — ABNORMAL LOW (ref 13.0–17.0)
MCH: 29.4 pg (ref 26.0–34.0)
MCH: 29.8 pg (ref 26.0–34.0)
MCHC: 31.7 g/dL (ref 30.0–36.0)
MCHC: 31.3 g/dL (ref 30.0–36.0)
MCV: 92.8 fL (ref 78.0–100.0)
MCV: 95.1 fL (ref 78.0–100.0)
Platelets: 149 10*3/uL — ABNORMAL LOW (ref 150–400)
Platelets: 135 10*3/uL — ABNORMAL LOW (ref 150–400)
RBC: 3.74 MIL/uL — ABNORMAL LOW (ref 4.22–5.81)
RBC: 3.86 MIL/uL — AB (ref 4.22–5.81)
RDW: 17.9 % — AB (ref 11.5–15.5)
RDW: 18.3 % — ABNORMAL HIGH (ref 11.5–15.5)
WBC: 7.2 10*3/uL (ref 4.0–10.5)
WBC: 6.9 10*3/uL (ref 4.0–10.5)

## 2013-11-06 LAB — PROCALCITONIN: Procalcitonin: 0.34 ng/mL

## 2013-11-07 ENCOUNTER — Other Ambulatory Visit (HOSPITAL_COMMUNITY): Payer: Self-pay

## 2013-11-07 LAB — CBC
HEMATOCRIT: 34.6 % — AB (ref 39.0–52.0)
Hemoglobin: 10.9 g/dL — ABNORMAL LOW (ref 13.0–17.0)
MCH: 29.5 pg (ref 26.0–34.0)
MCHC: 31.5 g/dL (ref 30.0–36.0)
MCV: 93.5 fL (ref 78.0–100.0)
Platelets: 134 10*3/uL — ABNORMAL LOW (ref 150–400)
RBC: 3.7 MIL/uL — ABNORMAL LOW (ref 4.22–5.81)
RDW: 18.1 % — ABNORMAL HIGH (ref 11.5–15.5)
WBC: 6.5 10*3/uL (ref 4.0–10.5)

## 2013-11-07 LAB — BASIC METABOLIC PANEL
BUN: 33 mg/dL — AB (ref 6–23)
CO2: 28 mEq/L (ref 19–32)
Calcium: 8.5 mg/dL (ref 8.4–10.5)
Chloride: 102 mEq/L (ref 96–112)
Creatinine, Ser: 3.56 mg/dL — ABNORMAL HIGH (ref 0.50–1.35)
GFR calc Af Amer: 17 mL/min — ABNORMAL LOW (ref >90)
GFR, EST NON AFRICAN AMERICAN: 14 mL/min — AB (ref >90)
GLUCOSE: 90 mg/dL (ref 70–99)
Potassium: 4.5 mEq/L (ref 3.7–5.3)
Sodium: 144 mEq/L (ref 137–147)

## 2013-11-09 ENCOUNTER — Other Ambulatory Visit (HOSPITAL_COMMUNITY): Payer: Self-pay

## 2013-11-09 LAB — CBC WITH DIFFERENTIAL/PLATELET
Basophils Absolute: 0 10*3/uL (ref 0.0–0.1)
Basophils Relative: 0 % (ref 0–1)
EOS ABS: 0.1 10*3/uL (ref 0.0–0.7)
Eosinophils Relative: 2 % (ref 0–5)
HCT: 37.7 % — ABNORMAL LOW (ref 39.0–52.0)
HEMOGLOBIN: 11.8 g/dL — AB (ref 13.0–17.0)
Lymphocytes Relative: 26 % (ref 12–46)
Lymphs Abs: 1.7 10*3/uL (ref 0.7–4.0)
MCH: 29.6 pg (ref 26.0–34.0)
MCHC: 31.3 g/dL (ref 30.0–36.0)
MCV: 94.5 fL (ref 78.0–100.0)
MONO ABS: 0.7 10*3/uL (ref 0.1–1.0)
MONOS PCT: 11 % (ref 3–12)
Neutro Abs: 3.9 10*3/uL (ref 1.7–7.7)
Neutrophils Relative %: 61 % (ref 43–77)
Platelets: 141 10*3/uL — ABNORMAL LOW (ref 150–400)
RBC: 3.99 MIL/uL — ABNORMAL LOW (ref 4.22–5.81)
RDW: 18 % — ABNORMAL HIGH (ref 11.5–15.5)
WBC: 6.4 10*3/uL (ref 4.0–10.5)

## 2013-11-09 LAB — BASIC METABOLIC PANEL
BUN: 43 mg/dL — AB (ref 6–23)
CALCIUM: 8.4 mg/dL (ref 8.4–10.5)
CO2: 28 mEq/L (ref 19–32)
CREATININE: 3.63 mg/dL — AB (ref 0.50–1.35)
Chloride: 104 mEq/L (ref 96–112)
GFR, EST AFRICAN AMERICAN: 16 mL/min — AB (ref >90)
GFR, EST NON AFRICAN AMERICAN: 14 mL/min — AB (ref >90)
Glucose, Bld: 76 mg/dL (ref 70–99)
Potassium: 5.2 mEq/L (ref 3.7–5.3)
Sodium: 144 mEq/L (ref 137–147)

## 2013-11-09 LAB — PREALBUMIN: Prealbumin: 11.5 mg/dL — ABNORMAL LOW (ref 17.0–34.0)

## 2013-11-09 LAB — PHOSPHORUS: Phosphorus: 5.3 mg/dL — ABNORMAL HIGH (ref 2.3–4.6)

## 2013-11-09 LAB — MAGNESIUM: Magnesium: 2.3 mg/dL (ref 1.5–2.5)

## 2013-11-11 LAB — CBC WITH DIFFERENTIAL/PLATELET
BASOS ABS: 0 10*3/uL (ref 0.0–0.1)
Basophils Relative: 0 % (ref 0–1)
Eosinophils Absolute: 0.1 10*3/uL (ref 0.0–0.7)
Eosinophils Relative: 2 % (ref 0–5)
HEMATOCRIT: 37 % — AB (ref 39.0–52.0)
Hemoglobin: 11.4 g/dL — ABNORMAL LOW (ref 13.0–17.0)
Lymphocytes Relative: 31 % (ref 12–46)
Lymphs Abs: 1.8 10*3/uL (ref 0.7–4.0)
MCH: 29.5 pg (ref 26.0–34.0)
MCHC: 30.8 g/dL (ref 30.0–36.0)
MCV: 95.6 fL (ref 78.0–100.0)
Monocytes Absolute: 0.7 10*3/uL (ref 0.1–1.0)
Monocytes Relative: 12 % (ref 3–12)
Neutro Abs: 3.2 10*3/uL (ref 1.7–7.7)
Neutrophils Relative %: 55 % (ref 43–77)
Platelets: 163 10*3/uL (ref 150–400)
RBC: 3.87 MIL/uL — ABNORMAL LOW (ref 4.22–5.81)
RDW: 18.1 % — AB (ref 11.5–15.5)
WBC: 5.9 10*3/uL (ref 4.0–10.5)

## 2013-11-11 LAB — MAGNESIUM: Magnesium: 2.4 mg/dL (ref 1.5–2.5)

## 2013-11-11 LAB — BASIC METABOLIC PANEL
BUN: 47 mg/dL — ABNORMAL HIGH (ref 6–23)
CALCIUM: 8.8 mg/dL (ref 8.4–10.5)
CO2: 29 meq/L (ref 19–32)
Chloride: 108 mEq/L (ref 96–112)
Creatinine, Ser: 3.46 mg/dL — ABNORMAL HIGH (ref 0.50–1.35)
GFR calc non Af Amer: 15 mL/min — ABNORMAL LOW (ref >90)
GFR, EST AFRICAN AMERICAN: 17 mL/min — AB (ref >90)
Glucose, Bld: 82 mg/dL (ref 70–99)
Potassium: 5.7 mEq/L — ABNORMAL HIGH (ref 3.7–5.3)
Sodium: 147 mEq/L (ref 137–147)

## 2013-11-11 LAB — PHOSPHORUS: Phosphorus: 4.9 mg/dL — ABNORMAL HIGH (ref 2.3–4.6)

## 2013-11-12 LAB — BASIC METABOLIC PANEL
BUN: 48 mg/dL — AB (ref 6–23)
CHLORIDE: 105 meq/L (ref 96–112)
CO2: 27 mEq/L (ref 19–32)
CREATININE: 3.5 mg/dL — AB (ref 0.50–1.35)
Calcium: 8.5 mg/dL (ref 8.4–10.5)
GFR calc non Af Amer: 15 mL/min — ABNORMAL LOW (ref >90)
GFR, EST AFRICAN AMERICAN: 17 mL/min — AB (ref >90)
Glucose, Bld: 93 mg/dL (ref 70–99)
Potassium: 5.7 mEq/L — ABNORMAL HIGH (ref 3.7–5.3)
Sodium: 144 mEq/L (ref 137–147)

## 2013-11-13 ENCOUNTER — Ambulatory Visit: Payer: Self-pay | Admitting: Hospice and Palliative Medicine

## 2013-11-13 LAB — CBC
HCT: 36.4 % — ABNORMAL LOW (ref 39.0–52.0)
Hemoglobin: 11.1 g/dL — ABNORMAL LOW (ref 13.0–17.0)
MCH: 29.4 pg (ref 26.0–34.0)
MCHC: 30.5 g/dL (ref 30.0–36.0)
MCV: 96.6 fL (ref 78.0–100.0)
Platelets: 167 10*3/uL (ref 150–400)
RBC: 3.77 MIL/uL — ABNORMAL LOW (ref 4.22–5.81)
RDW: 18.2 % — ABNORMAL HIGH (ref 11.5–15.5)
WBC: 6.2 10*3/uL (ref 4.0–10.5)

## 2013-11-13 LAB — BASIC METABOLIC PANEL
BUN: 44 mg/dL — AB (ref 6–23)
CALCIUM: 8.5 mg/dL (ref 8.4–10.5)
CO2: 28 mEq/L (ref 19–32)
CREATININE: 3.41 mg/dL — AB (ref 0.50–1.35)
Chloride: 108 mEq/L (ref 96–112)
GFR calc Af Amer: 17 mL/min — ABNORMAL LOW (ref >90)
GFR, EST NON AFRICAN AMERICAN: 15 mL/min — AB (ref >90)
GLUCOSE: 87 mg/dL (ref 70–99)
Potassium: 4.7 mEq/L (ref 3.7–5.3)
Sodium: 149 mEq/L — ABNORMAL HIGH (ref 137–147)

## 2013-11-16 ENCOUNTER — Non-Acute Institutional Stay (SKILLED_NURSING_FACILITY): Payer: Medicare Other | Admitting: Internal Medicine

## 2013-11-16 ENCOUNTER — Other Ambulatory Visit (HOSPITAL_COMMUNITY): Payer: Medicare Other

## 2013-11-16 ENCOUNTER — Encounter: Payer: Self-pay | Admitting: Internal Medicine

## 2013-11-16 DIAGNOSIS — F0390 Unspecified dementia without behavioral disturbance: Secondary | ICD-10-CM | POA: Insufficient documentation

## 2013-11-16 DIAGNOSIS — M25562 Pain in left knee: Secondary | ICD-10-CM

## 2013-11-16 DIAGNOSIS — M25561 Pain in right knee: Secondary | ICD-10-CM

## 2013-11-16 DIAGNOSIS — H9193 Unspecified hearing loss, bilateral: Secondary | ICD-10-CM | POA: Insufficient documentation

## 2013-11-16 DIAGNOSIS — Z86718 Personal history of other venous thrombosis and embolism: Secondary | ICD-10-CM

## 2013-11-16 DIAGNOSIS — N183 Chronic kidney disease, stage 3 unspecified: Secondary | ICD-10-CM | POA: Insufficient documentation

## 2013-11-16 DIAGNOSIS — N189 Chronic kidney disease, unspecified: Secondary | ICD-10-CM

## 2013-11-16 DIAGNOSIS — H9190 Unspecified hearing loss, unspecified ear: Secondary | ICD-10-CM

## 2013-11-16 DIAGNOSIS — G8929 Other chronic pain: Secondary | ICD-10-CM

## 2013-11-16 DIAGNOSIS — I509 Heart failure, unspecified: Secondary | ICD-10-CM

## 2013-11-16 DIAGNOSIS — J189 Pneumonia, unspecified organism: Secondary | ICD-10-CM

## 2013-11-16 DIAGNOSIS — I10 Essential (primary) hypertension: Secondary | ICD-10-CM

## 2013-11-16 DIAGNOSIS — F1011 Alcohol abuse, in remission: Secondary | ICD-10-CM

## 2013-11-16 DIAGNOSIS — M25569 Pain in unspecified knee: Secondary | ICD-10-CM

## 2013-11-16 DIAGNOSIS — E46 Unspecified protein-calorie malnutrition: Secondary | ICD-10-CM

## 2013-11-16 DIAGNOSIS — Z8709 Personal history of other diseases of the respiratory system: Secondary | ICD-10-CM

## 2013-11-16 DIAGNOSIS — R1310 Dysphagia, unspecified: Secondary | ICD-10-CM | POA: Insufficient documentation

## 2013-11-16 LAB — COMPREHENSIVE METABOLIC PANEL
ALBUMIN: 2.8 g/dL — AB (ref 3.5–5.2)
ALK PHOS: 68 U/L (ref 39–117)
ALT: 12 U/L (ref 0–53)
AST: 17 U/L (ref 0–37)
BUN: 44 mg/dL — ABNORMAL HIGH (ref 6–23)
CALCIUM: 8.7 mg/dL (ref 8.4–10.5)
CO2: 26 mEq/L (ref 19–32)
Chloride: 110 mEq/L (ref 96–112)
Creatinine, Ser: 2.94 mg/dL — ABNORMAL HIGH (ref 0.50–1.35)
GFR calc Af Amer: 21 mL/min — ABNORMAL LOW (ref >90)
GFR calc non Af Amer: 18 mL/min — ABNORMAL LOW (ref >90)
Glucose, Bld: 81 mg/dL (ref 70–99)
POTASSIUM: 5.9 meq/L — AB (ref 3.7–5.3)
SODIUM: 145 meq/L (ref 137–147)
TOTAL PROTEIN: 6.3 g/dL (ref 6.0–8.3)
Total Bilirubin: <0.2 mg/dL — ABNORMAL LOW (ref 0.3–1.2)

## 2013-11-16 LAB — CBC
HCT: 35.9 % — ABNORMAL LOW (ref 39.0–52.0)
Hemoglobin: 11 g/dL — ABNORMAL LOW (ref 13.0–17.0)
MCH: 29.3 pg (ref 26.0–34.0)
MCHC: 30.6 g/dL (ref 30.0–36.0)
MCV: 95.5 fL (ref 78.0–100.0)
PLATELETS: 183 10*3/uL (ref 150–400)
RBC: 3.76 MIL/uL — ABNORMAL LOW (ref 4.22–5.81)
RDW: 17.9 % — AB (ref 11.5–15.5)
WBC: 4.8 10*3/uL (ref 4.0–10.5)

## 2013-11-16 LAB — PREALBUMIN: Prealbumin: 11.9 mg/dL — ABNORMAL LOW (ref 17.0–34.0)

## 2013-11-16 NOTE — Assessment & Plan Note (Signed)
EF 55-60%; no specific meds

## 2013-11-16 NOTE — Assessment & Plan Note (Signed)
Continue aricept and namenda.   

## 2013-11-16 NOTE — Assessment & Plan Note (Signed)
On no meds and none needed at the moment;will monitor

## 2013-11-16 NOTE — Assessment & Plan Note (Signed)
Thiamine and folate

## 2013-11-16 NOTE — Progress Notes (Signed)
MRN: [id number] Name: Elijah Ramos  Sex: male Age: 80 y.o. DOB: [birth year]  PSC #: [facility] Facility/Room: 123 Level Of Care: SNF Provider: Margit Hanks Emergency Contacts: Extended Emergency Contact Information Primary Emergency Contact: No,Contact  [place] Home Phone: [phone] Relation: None  Code Status:   Allergies: Aleve; Beta adrenergic blockers; Penicillins; and Sulfa antibiotics  Chief Complaint  Patient presents with  . nursing home admission    HPI: Patient is 80 y.o. male [facility] for OT/PT/ST for generalizes weakness and dysphagia.  Past Medical History  Diagnosis Date  . Protein calorie malnutrition   . Dysphagia   . CHF (congestive heart failure)   . CKD (chronic kidney disease)   . Dementia without behavioral disturbance   . Hypertension   . Personal history of DVT (deep vein thrombosis)   . H/O: alcohol abuse   . Stroke   . PNA (pneumonia)     RLL  . H/O acute respiratory failure   . Deafness, bilateral     reads lips  . Hyperkalemia   . Bilateral chronic knee pain     No past surgical history on file.    Medication List       This list is accurate as of: 11/16/13  7:11 PM.  Always use your most recent med list.               acidophilus Caps capsule  Take 1 capsule by mouth 2 (two) times daily.     aspirin 81 MG tablet  Take 81 mg by mouth daily.     atorvastatin 10 MG tablet  Commonly known as:  LIPITOR  Take 5 mg by mouth daily.     docusate sodium 100 MG capsule  Commonly known as:  COLACE  Take 100 mg by mouth 2 (two) times daily.     donepezil 10 MG tablet  Commonly known as:  ARICEPT     fluticasone 50 MCG/ACT nasal spray  Commonly known as:  FLONASE  Place 1 spray into both nostrils daily.     folic acid 1 MG tablet  Commonly known as:  FOLVITE  Take 1 mg by mouth daily.     heparin 5000 UNIT/ML injection  Inject into  the skin every 8 (eight) hours.     NAMENDA 5 MG tablet  Generic drug:  memantine     omeprazole 40 MG capsule  Commonly known as:  PRILOSEC  Take 40 mg by mouth daily.     oxycodone 5 MG capsule  Commonly known as:  OXY-IR  Take 5 mg by mouth every 6 (six) hours as needed.     polyethylene glycol packet  Commonly known as:  MIRALAX / GLYCOLAX  Take 17 g by mouth daily.     sodium bicarbonate 650 MG tablet  Take 650 mg by mouth 2 (two) times daily.     thiamine 100 MG tablet  Take 100 mg by mouth daily.     ZYRTEC ALLERGY 10 MG tablet  Generic drug:  cetirizine  Take 10 mg by mouth at bedtime.        Meds ordered this encounter  Medications  . donepezil (ARICEPT) 10 MG tablet    Sig:   . NAMENDA 5 MG tablet    Sig:   . aspirin 81 MG tablet    Sig: Take 81 mg by mouth daily.  Marland Kitchen atorvastatin (LIPITOR) 10 MG tablet  Sig: Take 5 mg by mouth daily.  . cetirizine (ZYRTEC ALLERGY) 10 MG tablet    Sig: Take 10 mg by mouth at bedtime.  . docusate sodium (COLACE) 100 MG capsule    Sig: Take 100 mg by mouth 2 (two) times daily.  . fluticasone (FLONASE) 50 MCG/ACT nasal spray    Sig: Place 1 spray into both nostrils daily.  . folic acid (FOLVITE) 1 MG tablet    Sig: Take 1 mg by mouth daily.  . heparin 5000 UNIT/ML injection    Sig: Inject into the skin every 8 (eight) hours.  . polyethylene glycol (MIRALAX / GLYCOLAX) packet    Sig: Take 17 g by mouth daily.  Marland Kitchen. omeprazole (PRILOSEC) 40 MG capsule    Sig: Take 40 mg by mouth daily.  Marland Kitchen. acidophilus (RISAQUAD) CAPS capsule    Sig: Take 1 capsule by mouth 2 (two) times daily.  . sodium bicarbonate 650 MG tablet    Sig: Take 650 mg by mouth 2 (two) times daily.  Marland Kitchen. thiamine 100 MG tablet    Sig: Take 100 mg by mouth daily.  Marland Kitchen. oxycodone (OXY-IR) 5 MG capsule    Sig: Take 5 mg by mouth every 6 (six) hours as needed.     There is no immunization history on file for this patient.  History  Substance Use Topics  .  Smoking status: Unknown If Ever Smoked  . Smokeless tobacco: Not on file  . Alcohol Use: Yes     Comment: former abuse    Family history is noncontributory    Review of Systems  DATA OBTAINED: from patient; no c/o GENERAL: Feels well no fevers, fatigue, appetite changes SKIN: No itching, rash or wounds EYES: No eye pain, redness, discharge EARS: deaf NOSE: No congestion, drainage or bleeding  MOUTH/THROAT: No mouth or tooth pain, No sore throat, No difficulty chewing or swallowing  RESPIRATORY: No cough, wheezing, SOB CARDIAC: No chest pain, palpitations, lower extremity edema  GI: No abdominal pain, No N/V/D or constipation, No heartburn or reflux  GU: No dysuria, frequency or urgency, or incontinence  MUSCULOSKELETAL: No unrelieved bone/joint pain NEUROLOGIC: No headache, dizziness PSYCHIATRIC: No overt anxiety or sadness. Sleeps well. No behavior issue.   Filed Vitals:   11/16/13 1858  BP: 120/71  Pulse: 61  Temp: 97.6 F (36.4 C)  Resp: 22    Physical Exam  GENERAL APPEARANCE: Alert, conversant. Appropriately groomed. No acute distress.  SKIN: No diaphoresis rash HEAD: Normocephalic, atraumatic  EYES: Conjunctiva/lids clear. Pupils round, reactive. EOMs intact.  EARS: External exam WNL, canals clear.deaf NOSE: No deformity or discharge.  MOUTH/THROAT: Lips w/o lesions  RESPIRATORY: Breathing is even, unlabored. Lung sounds are clear   CARDIOVASCULAR: Heart RRR 4/6 high pitched systolic murmur, no rubs or gallops. No peripheral edema.   GASTROINTESTINAL: Abdomen is soft, non-tender, not distended w/ normal bowel sounds. GENITOURINARY: Bladder non tender, not distended  MUSCULOSKELETAL: No abnormal joints or musculature NEUROLOGIC: Oriented X2 Moves all extremities no tremor. PSYCHIATRIC: Mood and affect appropriate to situation, no behavioral issues  Patient Active Problem List   Diagnosis Date Noted  . Protein calorie malnutrition   . Dysphagia   . CHF  (congestive heart failure)   . CKD (chronic kidney disease)   . Dementia without behavioral disturbance   . Hypertension   . Personal history of DVT (deep vein thrombosis)   . H/O: alcohol abuse   . PNA (pneumonia)   . H/O acute respiratory failure   .  Deafness, bilateral   . Bilateral chronic knee pain     CBC    Component Value Date/Time   WBC 4.8 11/16/2013 0717   RBC 3.76* 11/16/2013 0717   HGB 11.0* 11/16/2013 0717   HCT 35.9* 11/16/2013 0717   PLT 183 11/16/2013 0717   MCV 95.5 11/16/2013 0717   LYMPHSABS 1.8 11/11/2013 0731   MONOABS 0.7 11/11/2013 0731   EOSABS 0.1 11/11/2013 0731   BASOSABS 0.0 11/11/2013 0731    CMP     Component Value Date/Time   NA 145 11/16/2013 0717   K 5.9* 11/16/2013 0717   CL 110 11/16/2013 0717   CO2 26 11/16/2013 0717   GLUCOSE 81 11/16/2013 0717   BUN 44* 11/16/2013 0717   CREATININE 2.94* 11/16/2013 0717   CALCIUM 8.7 11/16/2013 0717   PROT 6.3 11/16/2013 0717   ALBUMIN 2.8* 11/16/2013 0717   AST 17 11/16/2013 0717   ALT 12 11/16/2013 0717   ALKPHOS 68 11/16/2013 0717   BILITOT <0.2* 11/16/2013 0717   GFRNONAA 18* 11/16/2013 0717   GFRAA 21* 11/16/2013 0717    Assessment and Plan  H/O acute respiratory failure 2/2 RLL infiltrate, treated with levaquin with resolution finally  CKD (chronic kidney disease) S/p ARF requiring hemodialysis for several rounds; 5/1 BUN 44/Cr 3.4  PNA (pneumonia) Resolved on levaquin  Protein calorie malnutrition Encourage and supplements with dysphygia 1 diet, nectar thickened  CHF (congestive heart failure) EF 55-60%; no specific meds  Hypertension On no meds and none needed at the moment;will monitor  Dementia without behavioral disturbance Continue aricept and namenda  Personal history of DVT (deep vein thrombosis) Pt on heparin on admit to SNF; will need to consider plavix once we see he is moving well  H/O: alcohol abuse Thiamine and folate  Bilateral chronic knee pain Pain meds are for this    Margit HanksAnne D Utah Delauder,  MD

## 2013-11-16 NOTE — Assessment & Plan Note (Signed)
Pain meds are for this

## 2013-11-16 NOTE — Assessment & Plan Note (Signed)
Resolved on levaquin

## 2013-11-16 NOTE — Assessment & Plan Note (Signed)
Encourage and supplements with dysphygia 1 diet, nectar thickened

## 2013-11-16 NOTE — Assessment & Plan Note (Signed)
Pt on heparin on admit to SNF; will need to consider plavix once we see he is moving well

## 2013-11-16 NOTE — Procedures (Signed)
Request by [facility] to remove non-tunneled dialysis catheter [facility].  Catheter removed at bedside. Vaseline gauze applied with transparent dressing.  Site reviewed with Charlies Constable Wilson RN.

## 2013-11-16 NOTE — Assessment & Plan Note (Signed)
2/2 RLL infiltrate, treated with levaquin with resolution finally

## 2013-11-16 NOTE — Assessment & Plan Note (Signed)
S/p ARF requiring hemodialysis for several rounds; 5/1 BUN 44/Cr 3.4

## 2013-12-03 ENCOUNTER — Non-Acute Institutional Stay (SKILLED_NURSING_FACILITY): Payer: Medicare Other | Admitting: Nurse Practitioner

## 2013-12-03 ENCOUNTER — Encounter: Payer: Self-pay | Admitting: Nurse Practitioner

## 2013-12-03 DIAGNOSIS — Z86718 Personal history of other venous thrombosis and embolism: Secondary | ICD-10-CM

## 2013-12-03 DIAGNOSIS — I6990 Unspecified sequelae of unspecified cerebrovascular disease: Secondary | ICD-10-CM

## 2013-12-03 NOTE — Progress Notes (Signed)
Patient ID: Elijah Ramos, male   DOB: October 30, 1925, 80 y.o.   MRN: [id number]    Nursing Home Location:      Place of Service:    PCP: No primary provider on file.  Allergies  Allergen Reactions  . Aleve [Naproxen Sodium]   . Beta Adrenergic Blockers   . Penicillins   . Sulfa Antibiotics     Chief Complaint  Patient presents with  . Acute Visit    HPI:  Patient is 80 y.o. male who [facility] SNF for OT/PT/ST for generalizes weakness and dysphagia who is being seen today at the request of nursing due to pt being on ongoing lovenox for prophylactic due to decreased mobility. Pt with a pmh of CKD, deafness, dementia, dysphagia, CVA, DVT. Pt with increase mobility and strength after working with therapies.    Review of Systems:  Review of Systems  Unable to perform ROS    Past Medical History  Diagnosis Date  . Protein calorie malnutrition   . Dysphagia   . CHF (congestive heart failure)   . CKD (chronic kidney disease)   . Dementia without behavioral disturbance   . Hypertension   . Personal history of DVT (deep vein thrombosis)   . H/O: alcohol abuse   . Stroke   . PNA (pneumonia)     RLL  . H/O acute respiratory failure   . Deafness, bilateral     reads lips  . Hyperkalemia   . Bilateral chronic knee pain   . Late effects of CVA (cerebrovascular accident)    No past surgical history on file. Social History:   reports that he drinks alcohol. His tobacco and drug histories are not on file.  No family history on file.  Medications: Patient's Medications  New Prescriptions   No medications on file  Previous Medications   ACIDOPHILUS (RISAQUAD) CAPS CAPSULE    Take 1 capsule by mouth 2 (two) times daily.   ASPIRIN 81 MG TABLET    Take 81 mg by mouth daily.   ATORVASTATIN (LIPITOR) 10 MG TABLET    Take 5 mg by mouth daily.   CETIRIZINE (ZYRTEC ALLERGY) 10 MG TABLET    Take 10 mg by mouth  at bedtime.   DOCUSATE SODIUM (COLACE) 100 MG CAPSULE    Take 100 mg by mouth 2 (two) times daily.   DONEPEZIL (ARICEPT) 10 MG TABLET       ENOXAPARIN (LOVENOX) 40 MG/0.4ML INJECTION    Inject 40 mg into the skin daily.   FLUTICASONE (FLONASE) 50 MCG/ACT NASAL SPRAY    Place 1 spray into both nostrils daily.   FOLIC ACID (FOLVITE) 1 MG TABLET    Take 1 mg by mouth daily.   NAMENDA 5 MG TABLET       OMEPRAZOLE (PRILOSEC) 40 MG CAPSULE    Take 40 mg by mouth daily.   OXYCODONE (OXY-IR) 5 MG CAPSULE    Take 5 mg by mouth every 6 (six) hours as needed.   POLYETHYLENE GLYCOL (MIRALAX / GLYCOLAX) PACKET    Take 17 g by mouth daily.   SODIUM BICARBONATE 650 MG TABLET    Take 650 mg by mouth 2 (two) times daily.   THIAMINE 100 MG TABLET    Take 100 mg by mouth daily.  Modified Medications   No medications on file  Discontinued Medications   HEPARIN 5000 UNIT/ML INJECTION    Inject into the skin  every 8 (eight) hours.     Physical Exam:  Filed Vitals:   12/03/13 2126  BP: 110/76  Pulse: 72  Temp: 97 F (36.1 C)  Resp: 20   GENERAL APPEARANCE: Alert, Appropriately groomed. No acute distress.   SKIN: No diaphoresis HEAD: Normocephalic, atraumatic   EYES: Conjunctiva/lids clear. Pupils round, reactive. EOMs intact.   EARS:.deaf NOSE: No deformity or discharge.   MOUTH/THROAT: Lips w/o lesions   RESPIRATORY: Breathing is even, unlabored. Lung sounds are clear    CARDIOVASCULAR: Heart RRR 4/6 high pitched systolic murmur, no rubs or gallops. No peripheral edema   GASTROINTESTINAL: Abdomen is soft, non-tender, not distended w/ normal bowel sounds. GENITOURINARY: Bladder non tender, not distended   MUSCULOSKELETAL: No abnormal joints or musculature NEUROLOGIC: Oriented X2 Moves all extremities no tremor. PSYCHIATRIC: Mood and affect appropriate to situation, no behavioral issues       Labs reviewed: Basic Metabolic Panel:  Recent Labs  16/04/9603/15/15 0500  11/06/13 0700  11/09/13 0815  11/11/13 0731 11/12/13 1256 11/13/13 0805 11/16/13 0717  NA 146  < > 142  < > 144 147 144 149* 145  K 4.2  < > 4.5  < > 5.2 5.7* 5.7* 4.7 5.9*  CL 105  < > 100  < > 104 108 105 108 110  CO2 24  < > 31  < > 28 29 27 28 26   GLUCOSE 94  < > 95  < > 76 82 93 87 81  BUN 79*  < > 20  < > 43* 47* 48* 44* 44*  CREATININE 3.09*  < > 2.43*  < > 3.63* 3.46* 3.50* 3.41* 2.94*  CALCIUM 8.7  < > 8.4  < > 8.4 8.8 8.5 8.5 8.7  MG 2.1  --   --   --  2.3 2.4  --   --   --   PHOS 4.2  < > 4.0  --  5.3* 4.9*  --   --   --   < > = values in this interval not displayed. Liver Function Tests:  Recent Labs  10/27/13 0500 11/02/13 0503  11/04/13 0640 11/06/13 0700 11/16/13 0717  AST 11 14  --   --   --  17  ALT 9 12  --   --   --  12  ALKPHOS 63 72  --   --   --  68  BILITOT <0.2* <0.2*  --   --   --  <0.2*  PROT 6.1 6.2  --   --   --  6.3  ALBUMIN 2.6* 2.8*  < > 2.6* 2.8* 2.8*  < > = values in this interval not displayed. No results found for this basename: LIPASE, AMYLASE,  in the last 8760 hours No results found for this basename: AMMONIA,  in the last 8760 hours CBC:  Recent Labs  11/02/13 0503  11/09/13 0815 11/11/13 0731 11/13/13 0805 11/16/13 0717  WBC 11.4*  < > 6.4 5.9 6.2 4.8  NEUTROABS 9.4*  --  3.9 3.2  --   --   HGB 11.3*  < > 11.8* 11.4* 11.1* 11.0*  HCT 35.5*  < > 37.7* 37.0* 36.4* 35.9*  MCV 91.5  < > 94.5 95.6 96.6 95.5  PLT 194  < > 141* 163 167 183  < > = values in this interval not displayed. Cardiac Enzymes: No results found for this basename: CKTOTAL, CKMB, CKMBINDEX, TROPONINI,  in the last 8760 hours BNP: No components  found with this basename: POCBNP,  CBG: No results found for this basename: GLUCAP,  in the last 8760 hours TSH:  Recent Labs  10/27/13 0500 10/28/13 0500  TSH 0.319* 0.252*     Assessment/Plan 1. Personal history of DVT (deep vein thrombosis) -was placed on heparin in hospital, changed to lovenox in rehab, currently up and walking  with increased mobility, will change to plavix at this time  2. Late effects of CVA (cerebrovascular accident) -conts on asa, will add plavix in place of lovenox at this time

## 2013-12-18 ENCOUNTER — Non-Acute Institutional Stay (SKILLED_NURSING_FACILITY): Payer: Medicare Other | Admitting: Nurse Practitioner

## 2013-12-18 DIAGNOSIS — F0390 Unspecified dementia without behavioral disturbance: Secondary | ICD-10-CM

## 2013-12-18 DIAGNOSIS — I6990 Unspecified sequelae of unspecified cerebrovascular disease: Secondary | ICD-10-CM

## 2013-12-18 DIAGNOSIS — I10 Essential (primary) hypertension: Secondary | ICD-10-CM

## 2013-12-18 DIAGNOSIS — K219 Gastro-esophageal reflux disease without esophagitis: Secondary | ICD-10-CM

## 2013-12-18 DIAGNOSIS — K5900 Constipation, unspecified: Secondary | ICD-10-CM

## 2013-12-18 DIAGNOSIS — I509 Heart failure, unspecified: Secondary | ICD-10-CM

## 2013-12-18 DIAGNOSIS — J309 Allergic rhinitis, unspecified: Secondary | ICD-10-CM

## 2013-12-18 NOTE — Progress Notes (Signed)
Patient ID: Elijah Ramos, male   DOB: 09-24-1925, 80 y.o.   MRN: [id number]    Nursing Home Location:  [facility]   Place of Service: SNF (31)  PCP: No primary provider on file.  Allergies  Allergen Reactions  . Aleve [Naproxen Sodium]   . Beta Adrenergic Blockers   . Penicillins   . Sulfa Antibiotics     Chief Complaint  Patient presents with  . Medical Management of Chronic Issues    HPI:  Patient is 80 y.o. male with a pmh of CKD, deafness, dementia, dysphagia, CVA, hx of DVT who [facility] admitted to SNF for OT/PT/ST for generalizes weakness and dysphagia who is being seen today for routine follow up on chronic conditions.  Pt cont to work with therapies and is doing well. Staff without concerns today. Pt unable to provide HPI or ROS due to hearing loss.  Review of Systems:  Unable to perform   Past Medical History  Diagnosis Date  . Protein calorie malnutrition   . Dysphagia   . CHF (congestive heart failure)   . CKD (chronic kidney disease)   . Dementia without behavioral disturbance   . Hypertension   . Personal history of DVT (deep vein thrombosis)   . H/O: alcohol abuse   . Stroke   . PNA (pneumonia)     RLL  . H/O acute respiratory failure   . Deafness, bilateral     reads lips  . Hyperkalemia   . Bilateral chronic knee pain   . Late effects of CVA (cerebrovascular accident)    No past surgical history on file. Social History:   reports that he drinks alcohol. His tobacco and drug histories are not on file.  No family history on file.  Medications: Patient's Medications  New Prescriptions   No medications on file  Previous Medications   ACIDOPHILUS (RISAQUAD) CAPS CAPSULE    Take 1 capsule by mouth 2 (two) times daily. For digestive health   ASPIRIN 81 MG TABLET    Take 81 mg by mouth daily. For hx cva   ATORVASTATIN (LIPITOR) 10 MG TABLET    Take 5 mg by mouth daily. For  CHF   CETIRIZINE (ZYRTEC ALLERGY) 10 MG TABLET    Take 10 mg by mouth at bedtime. For allergies   CLOPIDOGREL (PLAVIX) 75 MG TABLET    Take 75 mg by mouth daily with breakfast. For CVA   DOCUSATE SODIUM (COLACE) 100 MG CAPSULE    Take 100 mg by mouth 2 (two) times daily. For constipation   DONEPEZIL (ARICEPT) 10 MG TABLET    Take 10 mg by mouth at bedtime. For dementia       FLUTICASONE (FLONASE) 50 MCG/ACT NASAL SPRAY    Place 2 sprays into both nostrils daily.    FOLIC ACID (FOLVITE) 1 MG TABLET    Take 1 mg by mouth daily.   NAMENDA 5 MG TABLET    Take 5 mg by mouth daily. For dementia   OMEPRAZOLE (PRILOSEC) 40 MG CAPSULE    Take 40 mg by mouth daily.   OXYCODONE (OXY-IR) 5 MG CAPSULE    Take 5 mg by mouth every 6 (six) hours as needed. For moderate to severe pain   PANTOPRAZOLE (PROTONIX) 40 MG TABLET    Take 40 mg by mouth daily.   POLYETHYLENE GLYCOL (MIRALAX / GLYCOLAX) PACKET    Take 17 g by mouth daily. For  bowel health   SODIUM BICARBONATE 650 MG TABLET    Take 650 mg by mouth 2 (two) times daily. For chronic kidney disease   THIAMINE 100 MG TABLET    Take 100 mg by mouth daily. For supplement  Modified Medications   No medications on file  Discontinued Medications   No medications on file     Physical Exam:  Filed Vitals:   12/18/13 1222  BP: 130/70  Pulse: 90  Temp: 98.1 F (36.7 C)  Resp: 20  Weight: 155 lb (70.308 kg)   Physical Exam  Constitutional: He is well-developed, well-nourished, and in no distress.  HENT:  Mouth/Throat: Oropharynx is clear and moist. No oropharyngeal exudate.  Eyes: Conjunctivae and EOM are normal. Pupils are equal, round, and reactive to light.  Neck: Normal range of motion. Neck supple.  Cardiovascular: Normal rate, regular rhythm and normal heart sounds.   Pulmonary/Chest: Effort normal and breath sounds normal.  Abdominal: Soft. Bowel sounds are normal.  Musculoskeletal: He exhibits no edema and no tenderness.  Neurological: He  is alert.  Skin: Skin is warm and dry.  Psychiatric: Affect normal.      Labs reviewed: Basic Metabolic Panel:  Recent Labs  10/28/13 0500  11/06/13 0700  11/09/13 0815 11/11/13 0731 11/12/13 1256 11/13/13 0805 11/16/13 0717  NA 146  < > 142  < > 144 147 144 149* 145  K 4.2  < > 4.5  < > 5.2 5.7* 5.7* 4.7 5.9*  CL 105  < > 100  < > 104 108 105 108 110  CO2 24  < > 31  < > '28 29 27 28 26  ' GLUCOSE 94  < > 95  < > 76 82 93 87 81  BUN 79*  < > 20  < > 43* 47* 48* 44* 44*  CREATININE 3.09*  < > 2.43*  < > 3.63* 3.46* 3.50* 3.41* 2.94*  CALCIUM 8.7  < > 8.4  < > 8.4 8.8 8.5 8.5 8.7  MG 2.1  --   --   --  2.3 2.4  --   --   --   PHOS 4.2  < > 4.0  --  5.3* 4.9*  --   --   --   < > = values in this interval not displayed. Liver Function Tests:  Recent Labs  10/27/13 0500 11/02/13 0503  11/04/13 0640 11/06/13 0700 11/16/13 0717  AST 11 14  --   --   --  17  ALT 9 12  --   --   --  12  ALKPHOS 63 72  --   --   --  68  BILITOT <0.2* <0.2*  --   --   --  <0.2*  PROT 6.1 6.2  --   --   --  6.3  ALBUMIN 2.6* 2.8*  < > 2.6* 2.8* 2.8*  < > = values in this interval not displayed. No results found for this basename: LIPASE, AMYLASE,  in the last 8760 hours No results found for this basename: AMMONIA,  in the last 8760 hours CBC:  Recent Labs  11/02/13 0503  11/09/13 0815 11/11/13 0731 11/13/13 0805 11/16/13 0717  WBC 11.4*  < > 6.4 5.9 6.2 4.8  NEUTROABS 9.4*  --  3.9 3.2  --   --   HGB 11.3*  < > 11.8* 11.4* 11.1* 11.0*  HCT 35.5*  < > 37.7* 37.0* 36.4* 35.9*  MCV 91.5  < >  94.5 95.6 96.6 95.5  PLT 194  < > 141* 163 167 183  < > = values in this interval not displayed.  TSH:  Recent Labs  10/27/13 0500 10/28/13 0500  TSH 0.319* 2.919*   Basic Metabolic Panel    Result: 11/17/2013 3:38 PM   ( Status: F )       Sodium 145     135-145 mEq/L SLN   Potassium 4.6     3.5-5.3 mEq/L SLN   Chloride 112     96-112 mEq/L SLN   CO2 25     19-32 mEq/L SLN    Glucose 106   H 70-99 mg/dL SLN   BUN 36   H 6-23 mg/dL SLN   Creatinine 2.86   H 0.50-1.35 mg/dL SLN   Calcium 8.3   L CBC NO Diff (Complete Blood Count)    Result: 11/18/2013 3:59 PM   ( Status: F )       WBC 4.8     4.0-10.5 K/uL SLN   RBC 3.53   L 4.22-5.81 MIL/uL SLN   Hemoglobin 10.3   L 13.0-17.0 g/dL SLN   Hematocrit 32.4   L 39.0-52.0 % SLN   MCV 91.8     78.0-100.0 fL SLN   MCH 29.2     26.0-34.0 pg SLN   MCHC 31.8     30.0-36.0 g/dL SLN   RDW 17.8   H 11.5-15.5 % SLN   Platelet Count 179     150-400 K/uL SLN   Basic Metabolic Panel    Result: 11/18/2013 4:00 PM   ( Status: F )       Sodium 144     135-145 mEq/L SLN   Potassium 4.5     3.5-5.3 mEq/L SLN   Chloride 113   H 96-112 mEq/L SLN   CO2 25     19-32 mEq/L SLN   Glucose 88     70-99 mg/dL SLN   BUN 35   H 6-23 mg/dL SLN   Creatinine 2.98   H 0.50-1.35 mg/dL SLN   Calcium 8.0   L  Basic Metabolic Panel    Result: 11/23/2013 11:11 PM   ( Status: F )     C Sodium 143     135-145 mEq/L SLN   Potassium 4.4     3.5-5.3 mEq/L SLN   Chloride 113   H 96-112 mEq/L SLN   CO2 22     19-32 mEq/L SLN   Glucose 84     70-99 mg/dL SLN   BUN 31   H 6-23 mg/dL SLN   Creatinine 2.28   H 0.50-1.35 mg/dL SLN   Calcium 7.8   L 8.4-10.5 mg/dL SLN   Lipid Profile    Result: 11/23/2013 11:11 PM   ( Status: F )       Cholesterol 133     0-200 mg/dL SLN C Triglyceride 122     <150 mg/dL SLN   HDL Cholesterol 30   L >39 mg/dL SLN   Total Chol/HDL Ratio 4.4      Ratio SLN   VLDL Cholesterol (Calc) 24     0-40 mg/dL SLN   LDL Cholesterol (Calc) 79     0-99 mg/dL SLN C CBC with Diff    Result: 11/25/2013 2:34 PM   ( Status: F )     C WBC 5.7     4.0-10.5 K/uL SLN   RBC 3.92   L 4.22-5.81 MIL/uL  SLN   Hemoglobin 11.7   L 13.0-17.0 g/dL SLN   Hematocrit 35.2   L 39.0-52.0 % SLN   MCV 89.8     78.0-100.0 fL SLN   MCH 29.8     26.0-34.0 pg SLN   MCHC 33.2     30.0-36.0 g/dL SLN   RDW 16.8   H 11.5-15.5 % SLN   Platelet Count 198      150-400 K/uL SLN   Granulocyte % 51     43-77 % SLN   Absolute Gran 2.9     1.7-7.7 K/uL SLN   Lymph % 34     12-46 % SLN   Absolute Lymph 1.9     0.7-4.0 K/uL SLN   Mono % 11     3-12 % SLN   Absolute Mono 0.6     0.1-1.0 K/uL SLN   Eos % 3     0-5 % SLN   Absolute Eos 0.2     0.0-0.7 K/uL SLN   Baso % 1     0-1 % SLN   Absolute Baso 0.1     0.0-0.1 K/uL SLN   Smear Review Criteria for review not met  SLN   Basic Metabolic Panel    Result: 11/25/2013 3:23 PM   ( Status: F )       Sodium 142     135-145 mEq/L SLN   Potassium 4.4     3.5-5.3 mEq/L SLN   Chloride 110     96-112 mEq/L SLN   CO2 21     19-32 mEq/L SLN   Glucose 89     70-99 mg/dL SLN   BUN 32   H 6-23 mg/dL SLN   Creatinine 2.49   H 0.50-1.35 mg/dL SLN   Calcium 8.3   L 8.4-10.5 mg/dL SLN   -- END OF REPORT -- Assessment/Plan 1. CHF (congestive heart failure) -stable at this time, conts on asa  2. Hypertension -blood pressure controlled without medications  3. Dementia without behavioral disturbance -conts on aricept and namenda  4. Late effects of CVA (cerebrovascular accident) -remains stable, conts to work with therapy, ASA and plavix  5. GERD (gastroesophageal reflux disease) -conts on prolosec  6. Unspecified constipation -stable on current medications, conts on colace and miralax  7. Allergic rhinitis -stable on zyrtec and flonase

## 2014-01-29 ENCOUNTER — Non-Acute Institutional Stay (SKILLED_NURSING_FACILITY): Payer: Medicare Other | Admitting: Nurse Practitioner

## 2014-01-29 DIAGNOSIS — K5900 Constipation, unspecified: Secondary | ICD-10-CM | POA: Insufficient documentation

## 2014-01-29 DIAGNOSIS — N189 Chronic kidney disease, unspecified: Secondary | ICD-10-CM

## 2014-01-29 DIAGNOSIS — I6990 Unspecified sequelae of unspecified cerebrovascular disease: Secondary | ICD-10-CM

## 2014-01-29 DIAGNOSIS — J309 Allergic rhinitis, unspecified: Secondary | ICD-10-CM

## 2014-01-29 DIAGNOSIS — I10 Essential (primary) hypertension: Secondary | ICD-10-CM

## 2014-01-29 DIAGNOSIS — F0390 Unspecified dementia without behavioral disturbance: Secondary | ICD-10-CM

## 2014-01-29 DIAGNOSIS — Z86718 Personal history of other venous thrombosis and embolism: Secondary | ICD-10-CM

## 2014-01-29 NOTE — Progress Notes (Signed)
Patient ID: Elijah Ramos, male   DOB: [birth year], 80 y.o.   MRN: [id number]    Nursing Home Location:  [facility]   Place of Service: SNF (31)  PCP: No primary provider on file.  Allergies  Allergen Reactions  . Aleve [Naproxen Sodium]   . Beta Adrenergic Blockers   . Penicillins   . Sulfa Antibiotics     Chief Complaint  Patient presents with  . Medical Management of Chronic Issues    HPI:  Patient is 80 y.o. male with a pmh of CKD, deafness, dementia, dysphagia, CVA, hx of DVT, who is being seen today for routine follow up on chronic conditions.  Staff without concerns today. Pt unable to provide HPI or ROS due to hearing loss.   Review of Systems:  Unable to perform due to hearing loss   Past Medical History  Diagnosis Date  . Protein calorie malnutrition   . Dysphagia   . CHF (congestive heart failure)   . CKD (chronic kidney disease)   . Dementia without behavioral disturbance   . Hypertension   . Personal history of DVT (deep vein thrombosis)   . H/O: alcohol abuse   . Stroke   . PNA (pneumonia)     RLL  . H/O acute respiratory failure   . Deafness, bilateral     reads lips  . Hyperkalemia   . Bilateral chronic knee pain   . Late effects of CVA (cerebrovascular accident)    No past surgical history on file. Social History:   reports that he drinks alcohol. His tobacco and drug histories are not on file.  No family history on file.  Medications: Patient's Medications  New Prescriptions   No medications on file  Previous Medications   ACIDOPHILUS (RISAQUAD) CAPS CAPSULE    Take 1 capsule by mouth 2 (two) times daily. For digestive health   ASPIRIN 81 MG TABLET    Take 81 mg by mouth daily. For hx cva   ATORVASTATIN (LIPITOR) 10 MG TABLET    Take 5 mg by mouth daily. For CHF   CETIRIZINE (ZYRTEC ALLERGY) 10 MG TABLET    Take 10 mg by mouth at bedtime. For allergies   CLOPIDOGREL (PLAVIX) 75 MG TABLET    Take 75 mg by mouth daily  with breakfast. For CVA   DOCUSATE SODIUM (COLACE) 100 MG CAPSULE    Take 100 mg by mouth 2 (two) times daily. For constipation   DONEPEZIL (ARICEPT) 10 MG TABLET    Take 10 mg by mouth at bedtime. For dementia       FLUTICASONE (FLONASE) 50 MCG/ACT NASAL SPRAY    Place 2 sprays into both nostrils daily.    FOLIC ACID (FOLVITE) 1 MG TABLET    Take 1 mg by mouth daily.   NAMENDA 5 MG TABLET    Take 5 mg by mouth daily. For dementia   OMEPRAZOLE (PRILOSEC) 40 MG CAPSULE    Take 40 mg by mouth daily.   OXYCODONE (OXY-IR) 5 MG CAPSULE    Take 5 mg by mouth every 6 (six) hours as needed. For moderate to severe pain   PANTOPRAZOLE (PROTONIX) 40 MG TABLET    Take 40 mg by mouth daily.   POLYETHYLENE GLYCOL (MIRALAX / GLYCOLAX) PACKET    Take 17 g by mouth daily. For bowel health   SODIUM BICARBONATE 650 MG TABLET    Take 650 mg by mouth 2 (two) times daily. For chronic kidney disease  THIAMINE 100 MG TABLET    Take 100 mg by mouth daily. For supplement  Modified Medications   No medications on file  Discontinued Medications   No medications on file     Physical Exam:  Filed Vitals:   01/29/14 2117  BP: 124/74  Pulse: 76  Temp: 98.3 F (36.8 C)  Resp: 18  Weight: 153 lb (69.4 kg)   Physical Exam  Nursing note and vitals reviewed. Constitutional: He is well-developed, well-nourished, and in no distress.  HENT:  Mouth/Throat: Oropharynx is clear and moist. No oropharyngeal exudate.  Pt is deaf  Eyes: Conjunctivae and EOM are normal. Pupils are equal, round, and reactive to light.  Neck: Normal range of motion. Neck supple.  Cardiovascular: Normal rate, regular rhythm and normal heart sounds.   Pulmonary/Chest: Effort normal and breath sounds normal.  Abdominal: Soft. Bowel sounds are normal.  Musculoskeletal: He exhibits no edema and no tenderness.  Neurological: He is alert.  Skin: Skin is warm and dry.  Psychiatric: Affect normal.      Labs reviewed: Basic Metabolic  Panel:  Recent Labs  10/28/13 0500  11/06/13 0700  11/09/13 0815 11/11/13 0731 11/12/13 1256 11/13/13 0805 11/16/13 0717  NA 146  < > 142  < > 144 147 144 149* 145  K 4.2  < > 4.5  < > 5.2 5.7* 5.7* 4.7 5.9*  CL 105  < > 100  < > 104 108 105 108 110  CO2 24  < > 31  < > _0 GLUCOSE 94  < > 95  < > 76 82 93 87 81  BUN 79*  < > 20  < > 43* 47* 48* 44* 44*  CREATININE 3.09*  < > 2.43*  < > 3.63* 3.46* 3.50* 3.41* 2.94*  CALCIUM 8.7  < > 8.4  < > 8.4 8.8 8.5 8.5 8.7  MG 2.1  --   --   --  2.3 2.4  --   --   --   PHOS 4.2  < > 4.0  --  5.3* 4.9*  --   --   --   < > = values in this interval not displayed. Liver Function Tests:  Recent Labs  10/27/13 0500 11/02/13 0503  11/04/13 0640 11/06/13 0700 11/16/13 0717  AST 11 14  --   --   --  17  ALT 9 12  --   --   --  12  ALKPHOS 63 72  --   --   --  68  BILITOT <0.2* <0.2*  --   --   --  <0.2*  PROT 6.1 6.2  --   --   --  6.3  ALBUMIN 2.6* 2.8*  < > 2.6* 2.8* 2.8*  < > = values in this interval not displayed. No results found for this basename: LIPASE, AMYLASE,  in the last 8760 hours No results found for this basename: AMMONIA,  in the last 8760 hours CBC:  Recent Labs  11/02/13 0503  11/09/13 0815 11/11/13 0731 11/13/13 0805 11/16/13 0717  WBC 11.4*  < > 6.4 5.9 6.2 4.8  NEUTROABS 9.4*  --  3.9 3.2  --   --   HGB 11.3*  < > 11.8* 11.4* 11.1* 11.0*  HCT 35.5*  < > 37.7* 37.0* 36.4* 35.9*  MCV 91.5  < > 94.5 95.6 96.6 95.5  PLT 194  < > 141* 163 167 183  < > =  values in this interval not displayed.  TSH:  Recent Labs  10/27/13 0500 10/28/13 0500  TSH 0.319* 3.810*   Basic Metabolic Panel    Result: 11/17/2013 3:38 PM   ( Status: F )       Sodium 145     135-145 mEq/L SLN   Potassium 4.6     3.5-5.3 mEq/L SLN   Chloride 112     96-112 mEq/L SLN   CO2 25     19-32 mEq/L SLN   Glucose 106   H 70-99 mg/dL SLN   BUN 36   H 6-23 mg/dL SLN   Creatinine 2.86   H 0.50-1.35 mg/dL SLN   Calcium 8.3    L CBC NO Diff (Complete Blood Count)    Result: 11/18/2013 3:59 PM   ( Status: F )       WBC 4.8     4.0-10.5 K/uL SLN   RBC 3.53   L 4.22-5.81 MIL/uL SLN   Hemoglobin 10.3   L 13.0-17.0 g/dL SLN   Hematocrit 32.4   L 39.0-52.0 % SLN   MCV 91.8     78.0-100.0 fL SLN   MCH 29.2     26.0-34.0 pg SLN   MCHC 31.8     30.0-36.0 g/dL SLN   RDW 17.8   H 11.5-15.5 % SLN   Platelet Count 179     150-400 K/uL SLN   Basic Metabolic Panel    Result: 11/18/2013 4:00 PM   ( Status: F )       Sodium 144     135-145 mEq/L SLN   Potassium 4.5     3.5-5.3 mEq/L SLN   Chloride 113   H 96-112 mEq/L SLN   CO2 25     19-32 mEq/L SLN   Glucose 88     70-99 mg/dL SLN   BUN 35   H 6-23 mg/dL SLN   Creatinine 2.98   H 0.50-1.35 mg/dL SLN   Calcium 8.0   L  Basic Metabolic Panel    Result: 11/23/2013 11:11 PM   ( Status: F )     C Sodium 143     135-145 mEq/L SLN   Potassium 4.4     3.5-5.3 mEq/L SLN   Chloride 113   H 96-112 mEq/L SLN   CO2 22     19-32 mEq/L SLN   Glucose 84     70-99 mg/dL SLN   BUN 31   H 6-23 mg/dL SLN   Creatinine 2.28   H 0.50-1.35 mg/dL SLN   Calcium 7.8   L 8.4-10.5 mg/dL SLN   Lipid Profile    Result: 11/23/2013 11:11 PM   ( Status: F )       Cholesterol 133     0-200 mg/dL SLN C Triglyceride 122     <150 mg/dL SLN   HDL Cholesterol 30   L >39 mg/dL SLN   Total Chol/HDL Ratio 4.4      Ratio SLN   VLDL Cholesterol (Calc) 24     0-40 mg/dL SLN   LDL Cholesterol (Calc) 79     0-99 mg/dL SLN C CBC with Diff    Result: 11/25/2013 2:34 PM   ( Status: F )     C WBC 5.7     4.0-10.5 K/uL SLN   RBC 3.92   L 4.22-5.81 MIL/uL SLN   Hemoglobin 11.7   L 13.0-17.0 g/dL SLN   Hematocrit 35.2   L  39.0-52.0 % [facility]   MCV 89.8     78.0-100.0 fL [facility]   MCH 29.8     26.0-34.0 pg [facility]   MCHC 33.2     30.0-36.0 g/dL [facility]   RDW 16.8   H 11.5-15.5 % [facility]   Platelet Count 198     150-400 K/uL [facility]   Granulocyte % 51     43-77 % [facility]   Absolute Gran 2.9     1.7-7.7 K/uL [facility]   Lymph % 34     12-46 % [facility]    Absolute Lymph 1.9     0.7-4.0 K/uL [facility]   Mono % 11     3-12 % [facility]   Absolute Mono 0.6     0.1-1.0 K/uL [facility]   Eos % 3     0-5 % [facility]   Absolute Eos 0.2     0.0-0.7 K/uL [facility]   Baso % 1     0-1 % [facility]   Absolute Baso 0.1     0.0-0.1 K/uL [facility]   Smear Review Criteria for review not met  [facility]   Basic Metabolic Panel    Result: 11/25/2013 3:23 PM   ( Status: F )       Sodium 142     135-145 mEq/L [facility]   Potassium 4.4     3.5-5.3 mEq/L [facility]   Chloride 110     96-112 mEq/L [facility]   CO2 21     19-32 mEq/L [facility]   Glucose 89     70-99 mg/dL [facility]   BUN 32   H 6-23 mg/dL [facility]   Creatinine 2.49   H 0.50-1.35 mg/dL [facility]   Calcium 8.3   L 8.4-10.5 mg/dL [facility]   -- END OF REPORT -- Assessment/Plan  1. Essential hypertension Controlled off medication  2. Dementia without behavioral disturbance -stable  3. Allergic rhinitis, unspecified allergic rhinitis type - without noted symptoms at this time, conts on zyrtec and flonase  4. Unspecified constipation stable on current medications, conts on colace and miralax  5. CKD (chronic kidney disease), unspecified stage Stable on current labs  6. Late effects of CVA (cerebrovascular accident) conts on asa and plavix  7. Personal history of DVT (deep vein thrombosis) conts on asa and plavix

## 2014-02-08 ENCOUNTER — Encounter: Payer: Self-pay | Admitting: Internal Medicine

## 2014-02-08 ENCOUNTER — Non-Acute Institutional Stay (SKILLED_NURSING_FACILITY): Payer: Medicare Other | Admitting: Internal Medicine

## 2014-02-08 DIAGNOSIS — Z86718 Personal history of other venous thrombosis and embolism: Secondary | ICD-10-CM

## 2014-02-08 DIAGNOSIS — F1011 Alcohol abuse, in remission: Secondary | ICD-10-CM

## 2014-02-08 DIAGNOSIS — F0390 Unspecified dementia without behavioral disturbance: Secondary | ICD-10-CM

## 2014-02-08 DIAGNOSIS — I10 Essential (primary) hypertension: Secondary | ICD-10-CM

## 2014-02-08 DIAGNOSIS — I509 Heart failure, unspecified: Secondary | ICD-10-CM

## 2014-02-08 NOTE — Assessment & Plan Note (Signed)
Maintained on no meds

## 2014-02-08 NOTE — Assessment & Plan Note (Signed)
No problems on aricept and namenda

## 2014-02-08 NOTE — Assessment & Plan Note (Signed)
On no meds, no problems

## 2014-02-08 NOTE — Progress Notes (Signed)
MRN: [id number] Name: Elijah Ramos  Sex: male Age: 80 y.o. DOB: Sep 03, 1925  PSC #: [facility] Facility/Room: 129A Level Of Care: SNF Provider: Merrilee Seashore D Emergency Contacts: Extended Emergency Contact Information Primary Emergency Contact: Bun,[name] Address: [address]          [place], [place] [address] [name] Home Phone: [phone] Mobile Phone: [phone] Relation: Son Secondary Emergency Contact: Dayrit,[name]  [place] Mobile Phone: [phone] Relation: Other  Code Status: FULL  Allergies: Aleve; Beta adrenergic blockers; Penicillins; and Sulfa antibiotics  Chief Complaint  Patient presents with  . Hospitalization Follow-up    HPI: Patient is 80 y.o. male who is being seen for routine problems.  Past Medical History  Diagnosis Date  . Protein calorie malnutrition   . Dysphagia   . CHF (congestive heart failure)   . CKD (chronic kidney disease)   . Dementia without behavioral disturbance   . Hypertension   . Personal history of DVT (deep vein thrombosis)   . H/O: alcohol abuse   . Stroke   . PNA (pneumonia)     RLL  . H/O acute respiratory failure   . Deafness, bilateral     reads lips  . Hyperkalemia   . Bilateral chronic knee pain   . Late effects of CVA (cerebrovascular accident)     No past surgical history on file.    Medication List       This list is accurate as of: 02/08/14  4:13 PM.  Always use your most recent med list.               acidophilus Caps capsule  Take 1 capsule by mouth 2 (two) times daily. For digestive health     aspirin 81 MG tablet  Take 81 mg by mouth daily. For hx cva     atorvastatin 10 MG tablet  Commonly known as:  LIPITOR  Take 5 mg by mouth daily. For CHF     clopidogrel 75 MG tablet  Commonly known as:  PLAVIX  Take 75 mg by mouth daily with breakfast. For CVA     docusate sodium 100 MG capsule  Commonly known as:  COLACE  Take 100 mg by mouth 2  (two) times daily. For constipation     donepezil 10 MG tablet  Commonly known as:  ARICEPT  Take 10 mg by mouth at bedtime. For dementia     fluticasone 50 MCG/ACT nasal spray  Commonly known as:  FLONASE  Place 2 sprays into both nostrils daily.     folic acid 1 MG tablet  Commonly known as:  FOLVITE  Take 1 mg by mouth daily.     NAMENDA 5 MG tablet  Generic drug:  memantine  Take 5 mg by mouth daily. For dementia     omeprazole 40 MG capsule  Commonly known as:  PRILOSEC  Take 40 mg by mouth daily.     oxycodone 5 MG capsule  Commonly known as:  OXY-IR  Take 5 mg by mouth every 6 (six) hours as needed. For moderate to severe pain     pantoprazole 40 MG tablet  Commonly known as:  PROTONIX  Take 40 mg by mouth daily.     polyethylene glycol packet  Commonly known as:  MIRALAX / GLYCOLAX  Take 17 g by mouth daily. For bowel health     sodium bicarbonate 650 MG tablet  Take 650 mg by mouth 2 (two) times daily.  For chronic kidney disease     thiamine 100 MG tablet  Take 100 mg by mouth daily. For supplement     ZYRTEC ALLERGY 10 MG tablet  Generic drug:  cetirizine  Take 10 mg by mouth at bedtime. For allergies        No orders of the defined types were placed in this encounter.     There is no immunization history on file for this patient.  History  Substance Use Topics  . Smoking status: Unknown If Ever Smoked  . Smokeless tobacco: Not on file  . Alcohol Use: Yes     Comment: former abuse    Review of Systems  DATA OBTAINED: from patient; no c/o GENERAL: Feels well no fevers, fatigue, appetite changes SKIN: No itching, rash HEENT: No complaint RESPIRATORY: No cough, wheezing, SOB CARDIAC: No chest pain, palpitations, lower extremity edema  GI: No abdominal pain, No N/V/D or constipation, No heartburn or reflux  GU: No dysuria, frequency or urgency, or incontinence  MUSCULOSKELETAL: No unrelieved bone/joint pain NEUROLOGIC: No headache,  dizziness or focal weakness PSYCHIATRIC: No overt anxiety or sadness. Sleeps well.   Filed Vitals:   02/08/14 1605  BP: 129/74  Pulse: 76  Temp: 97.6 F (36.4 C)  Resp: 16    Physical Exam  GENERAL APPEARANCE: Alert, minconversant. Appropriately groomed. No acute distress  SKIN: No diaphoresis rash HEENT: Unremarkable RESPIRATORY: Breathing is even, unlabored. Lung sounds are clear   CARDIOVASCULAR: Heart RRR no murmurs, rubs or gallops. No peripheral edema  GASTROINTESTINAL: Abdomen is soft, non-tender, not distended w/ normal bowel sounds.  GENITOURINARY: Bladder non tender, not distended  MUSCULOSKELETAL: No abnormal joints or musculature NEUROLOGIC: Cranial nerves 2-12 grossly intact. Moves all extremities no tremor. PSYCHIATRIC:, no behavioral issues  Patient Active Problem List   Diagnosis Date Noted  . Allergic rhinitis 01/29/2014  . Unspecified constipation 01/29/2014  . Late effects of CVA (cerebrovascular accident)   . Protein calorie malnutrition   . Dysphagia   . Chronic CHF (congestive heart failure)   . CKD (chronic kidney disease)   . Dementia without behavioral disturbance   . Hypertension   . Personal history of DVT (deep vein thrombosis)   . H/O: alcohol abuse   . PNA (pneumonia)   . H/O acute respiratory failure   . Deafness, bilateral   . Bilateral chronic knee pain     CBC    Component Value Date/Time   WBC 4.8 11/16/2013 0717   RBC 3.76* 11/16/2013 0717   HGB 11.0* 11/16/2013 0717   HCT 35.9* 11/16/2013 0717   PLT 183 11/16/2013 0717   MCV 95.5 11/16/2013 0717   LYMPHSABS 1.8 11/11/2013 0731   MONOABS 0.7 11/11/2013 0731   EOSABS 0.1 11/11/2013 0731   BASOSABS 0.0 11/11/2013 0731    CMP     Component Value Date/Time   NA 145 11/16/2013 0717   K 5.9* 11/16/2013 0717   CL 110 11/16/2013 0717   CO2 26 11/16/2013 0717   GLUCOSE 81 11/16/2013 0717   BUN 44* 11/16/2013 0717   CREATININE 2.94* 11/16/2013 0717   CALCIUM 8.7 11/16/2013 0717   PROT 6.3 11/16/2013 0717    ALBUMIN 2.8* 11/16/2013 0717   AST 17 11/16/2013 0717   ALT 12 11/16/2013 0717   ALKPHOS 68 11/16/2013 0717   BILITOT <0.2* 11/16/2013 0717   GFRNONAA 18* 11/16/2013 0717   GFRAA 21* 11/16/2013 0717    Assessment and Plan  Chronic CHF (congestive heart failure) On no  meds, no problems  Hypertension Maintained on no meds  Dementia without behavioral disturbance No problems on aricept and namenda  Personal history of DVT (deep vein thrombosis) Pt on plavix now and doing well    Margit HanksALEXANDER, Shamyah Stantz D, MD

## 2014-02-08 NOTE — Assessment & Plan Note (Signed)
Pt on plavix now and doing well

## 2014-02-22 ENCOUNTER — Encounter: Payer: Self-pay | Admitting: Nurse Practitioner

## 2014-02-22 ENCOUNTER — Non-Acute Institutional Stay (SKILLED_NURSING_FACILITY): Payer: Medicare Other | Admitting: Nurse Practitioner

## 2014-02-22 DIAGNOSIS — I6990 Unspecified sequelae of unspecified cerebrovascular disease: Secondary | ICD-10-CM

## 2014-02-22 NOTE — Assessment & Plan Note (Signed)
Continue ASA 81 mg daily and discontinue Plavix. There is no documented hx stents or an MI. Continue to monitor.

## 2014-02-22 NOTE — Progress Notes (Signed)
Patient ID: Elijah SarasWilliam R Ramos, male   DOB: 01/24/1926, 80 y.o.   MRN: [id number]   [facility] ([facility]  Code Status:  Full Code  Chief Complaint  Patient presents with  . eval need for asa and plavix    HPI: This is an 80 y.o. Male with hx of bilateral deafness. I was asked to evaluate him by pharmacy for the continued need of Plavix and ASA. He was at [facility] in April with pneumonia and sepsis. He had a hx of DVT and was placed on heparin during his hospital stay for DVT prophylaxis. At some point the heparin was changed to plavix 75 mg daily. He has a hx of CVA and was on a daily aspirin as well.     Allergies  Allergen Reactions  . Aleve [Naproxen Sodium]   . Beta Adrenergic Blockers   . Penicillins   . Sulfa Antibiotics     MEDICATIONS -   Outpatient Encounter Prescriptions as of 02/22/2014  Medication Sig  . acidophilus (RISAQUAD) CAPS capsule Take 1 capsule by mouth 2 (two) times daily. For digestive health  . aspirin 81 MG tablet Take 81 mg by mouth daily. For hx cva  . atorvastatin (LIPITOR) 10 MG tablet Take 5 mg by mouth daily. For CHF  . cetirizine (ZYRTEC ALLERGY) 10 MG tablet Take 10 mg by mouth at bedtime. For allergies  . clopidogrel (PLAVIX) 75 MG tablet Take 75 mg by mouth daily with breakfast. For CVA  . docusate sodium (COLACE) 100 MG capsule Take 100 mg by mouth 2 (two) times daily. For constipation  . donepezil (ARICEPT) 10 MG tablet Take 10 mg by mouth at bedtime. For dementia  . fluticasone (FLONASE) 50 MCG/ACT nasal spray Place 2 sprays into both nostrils daily.   . folic acid (FOLVITE) 1 MG tablet Take 1 mg by mouth daily.  Marland Kitchen. NAMENDA 5 MG tablet Take 5 mg by mouth daily. For dementia  . omeprazole (PRILOSEC) 40 MG capsule Take 40 mg by mouth daily.  Marland Kitchen. oxycodone (OXY-IR) 5 MG capsule Take 5 mg by mouth every 6 (six) hours as needed. For moderate to severe pain  . pantoprazole (PROTONIX) 40 MG tablet Take 40 mg by mouth daily.  .  polyethylene glycol (MIRALAX / GLYCOLAX) packet Take 17 g by mouth daily. For bowel health  . sodium bicarbonate 650 MG tablet Take 650 mg by mouth 2 (two) times daily. For chronic kidney disease  . thiamine 100 MG tablet Take 100 mg by mouth daily. For supplement    Laboratory Studies Lab Results  Component Value Date   WBC 4.8 11/16/2013   HGB 11.0* 11/16/2013   HCT 35.9* 11/16/2013   MCV 95.5 11/16/2013   PLT 183 11/16/2013   Lab Results  Component Value Date   NA 145 11/16/2013   K 5.9* 11/16/2013   BUN 44* 11/16/2013   CREATININE 2.94* 11/16/2013   Lab Results  Component Value Date   CALCIUM 8.7 11/16/2013   ALBUMIN 2.8* 11/16/2013   AST 17 11/16/2013   ALT 12 11/16/2013   ALKPHOS 68 11/16/2013   BILITOT <0.2* 11/16/2013   GFRNONAA 18* 11/16/2013   GFRAA 21* 11/16/2013   Lab Results  Component Value Date   VITAMINB12 304 10/28/2013     REVIEW OF SYSTEMS  DATA OBTAINED: from patient, nurse, medical record GENERAL: Feels well  No recent fever, fatigue, change in activity status, appetite, or weight  RESPIRATORY: No cough, wheezing, SOB CARDIAC:  No chest pain, palpitations. No edema GI: No abdominal pain  No Nausea,vomiting,diarrhea or constipation  No heartburn or reflux  MUSCULOSKELETAL: Chronic right knee pain NEUROLOGIC: No dizziness, fainting, headache, numbness No change in mental status  PSYCHIATRIC: No feelings of anxiety, depression  Sleeps well   No behavior issue  PHYSICAL EXAM Filed Vitals:   02/22/14 1414  BP: 110/52  Pulse: 70  Temp: 98 F (36.7 C)  Resp: 18   There is no height or weight on file to calculate BMI. GENERAL APPEARANCE: No acute distress, appropriately groomed, normal body habitus Alert, pleasant, conversant. HOH SKIN: No diaphoresis, rash, wound HEAD: Normocephalic, atraumatic EYES: Conjunctiva/lids clear  RESPIRATORY: Breathing is even, unlabored  Lung sounds are clear and full  CARDIOVASCULAR: Heart RRR   No murmur or extra heart sounds   EDEMA: No  peripheral edema   MUSCULOSKELETAL.  MAE, Strength 4/5 to BUE and BLE PSYCHIATRIC: Mood and affect appropriate to situation   ASSESSMENT/PLAN   Late effects of CVA (cerebrovascular accident) Continue ASA 81 mg daily and discontinue Plavix. There is no documented hx stents or an MI. Continue to monitor.    Candelaria Celeste NP/Christina Wert RN,MSN  02/22/2014

## 2014-04-09 ENCOUNTER — Non-Acute Institutional Stay (SKILLED_NURSING_FACILITY): Payer: Medicare Other | Admitting: Nurse Practitioner

## 2014-04-09 DIAGNOSIS — J309 Allergic rhinitis, unspecified: Secondary | ICD-10-CM

## 2014-04-09 DIAGNOSIS — I509 Heart failure, unspecified: Secondary | ICD-10-CM

## 2014-04-09 DIAGNOSIS — I10 Essential (primary) hypertension: Secondary | ICD-10-CM

## 2014-04-09 DIAGNOSIS — N189 Chronic kidney disease, unspecified: Secondary | ICD-10-CM

## 2014-04-09 DIAGNOSIS — F0390 Unspecified dementia without behavioral disturbance: Secondary | ICD-10-CM

## 2014-04-09 NOTE — Progress Notes (Deleted)
Patient ID: Elijah Ramos, male   DOB: [birth year], 80 y.o.   MRN: [id number]    Nursing Home Location:  [facility]   Place of Service: SNF (31)  PCP: No primary provider on file.  Allergies  Allergen Reactions  . Aleve [Naproxen Sodium]   . Beta Adrenergic Blockers   . Penicillins   . Sulfa Antibiotics     Chief Complaint  Patient presents with  . Medical Management of Chronic Issues    HPI:  ***  Review of Systems:  Review of Systems  Constitutional: Negative for activity change, appetite change, fatigue and unexpected weight change.  HENT: Negative for congestion and hearing loss.   Eyes: Negative.   Respiratory: Negative for cough and shortness of breath.   Cardiovascular: Negative for chest pain, palpitations and leg swelling.  Gastrointestinal: Negative for abdominal pain, diarrhea and constipation.  Genitourinary: Negative for dysuria and difficulty urinating.  Musculoskeletal: Negative for arthralgias and myalgias.  Skin: Negative for color change and wound.  Neurological: Negative for dizziness and weakness.  Psychiatric/Behavioral: Negative for behavioral problems, confusion and agitation.     Past Medical History  Diagnosis Date  . Protein calorie malnutrition   . Dysphagia   . CHF (congestive heart failure)   . CKD (chronic kidney disease)   . Dementia without behavioral disturbance   . Hypertension   . Personal history of DVT (deep vein thrombosis)   . H/O: alcohol abuse   . Stroke   . PNA (pneumonia)     RLL  . H/O acute respiratory failure   . Deafness, bilateral     reads lips  . Hyperkalemia   . Bilateral chronic knee pain   . Late effects of CVA (cerebrovascular accident)    No past surgical history on file. Social History:   reports that he drinks alcohol. His tobacco and drug histories are not on file.  No family history on file.  Medications: Patient's Medications  New Prescriptions   No medications on file    Previous Medications   ACIDOPHILUS (RISAQUAD) CAPS CAPSULE    Take 1 capsule by mouth 2 (two) times daily. For digestive health   ASPIRIN 81 MG TABLET    Take 81 mg by mouth daily. For hx cva   ATORVASTATIN (LIPITOR) 10 MG TABLET    Take 5 mg by mouth daily. For CHF   CETIRIZINE (ZYRTEC ALLERGY) 10 MG TABLET    Take 10 mg by mouth at bedtime. For allergies   CLOPIDOGREL (PLAVIX) 75 MG TABLET    Take 75 mg by mouth daily with breakfast. For CVA   DOCUSATE SODIUM (COLACE) 100 MG CAPSULE    Take 100 mg by mouth 2 (two) times daily. For constipation   DONEPEZIL (ARICEPT) 10 MG TABLET    Take 10 mg by mouth at bedtime. For dementia   FLUTICASONE (FLONASE) 50 MCG/ACT NASAL SPRAY    Place 2 sprays into both nostrils daily.    FOLIC ACID (FOLVITE) 1 MG TABLET    Take 1 mg by mouth daily.   NAMENDA 5 MG TABLET    Take 5 mg by mouth daily. For dementia   OMEPRAZOLE (PRILOSEC) 40 MG CAPSULE    Take 40 mg by mouth daily.   OXYCODONE (OXY-IR) 5 MG CAPSULE    Take 5 mg by mouth every 6 (six) hours as needed. For moderate to severe pain   PANTOPRAZOLE (PROTONIX) 40 MG TABLET    Take 40 mg by mouth  daily.   POLYETHYLENE GLYCOL (MIRALAX / GLYCOLAX) PACKET    Take 17 g by mouth daily. For bowel health   SODIUM BICARBONATE 650 MG TABLET    Take 650 mg by mouth 2 (two) times daily. For chronic kidney disease   THIAMINE 100 MG TABLET    Take 100 mg by mouth daily. For supplement  Modified Medications   No medications on file  Discontinued Medications   No medications on file     Physical Exam: *** Filed Vitals:   04/09/14 1404  BP: 102/75  Pulse: 60  Temp: 98.4 F (36.9 C)  Resp: 18  Weight: 160 lb (72.576 kg)      Labs reviewed: Basic Metabolic Panel:  Recent Labs  16/10/96 0500  11/06/13 0700  11/09/13 0815 11/11/13 0731 11/12/13 1256 11/13/13 0805 11/16/13 0717  NA 146  < > 142  < > 144 147 144 149* 145  K 4.2  < > 4.5  < > 5.2 5.7* 5.7* 4.7 5.9*  CL 105  < > 100  < > 104 108 105  108 110  CO2 24  < > 31  < > GLUCOSE 94  < > 95  < > 76 82 93 87 81  BUN 79*  < > 20  < > 43* 47* 48* 44* 44*  CREATININE 3.09*  < > 2.43*  < > 3.63* 3.46* 3.50* 3.41* 2.94*  CALCIUM 8.7  < > 8.4  < > 8.4 8.8 8.5 8.5 8.7  MG 2.1  --   --   --  2.3 2.4  --   --   --   PHOS 4.2  < > 4.0  --  5.3* 4.9*  --   --   --   < > = values in this interval not displayed. Liver Function Tests:  Recent Labs  10/27/13 0500 11/02/13 0503  11/04/13 0640 11/06/13 0700 11/16/13 0717  AST 11 14  --   --   --  17  ALT 9 12  --   --   --  12  ALKPHOS 63 72  --   --   --  68  BILITOT <0.2* <0.2*  --   --   --  <0.2*  PROT 6.1 6.2  --   --   --  6.3  ALBUMIN 2.6* 2.8*  < > 2.6* 2.8* 2.8*  < > = values in this interval not displayed. No results found for this basename: LIPASE, AMYLASE,  in the last 8760 hours No results found for this basename: AMMONIA,  in the last 8760 hours CBC:  Recent Labs  11/02/13 0503  11/09/13 0815 11/11/13 0731 11/13/13 0805 11/16/13 0717  WBC 11.4*  < > 6.4 5.9 6.2 4.8  NEUTROABS 9.4*  --  3.9 3.2  --   --   HGB 11.3*  < > 11.8* 11.4* 11.1* 11.0*  HCT 35.5*  < > 37.7* 37.0* 36.4* 35.9*  MCV 91.5  < > 94.5 95.6 96.6 95.5  PLT 194  < > 141* 163 167 183  < > = values in this interval not displayed. Cardiac Enzymes: No results found for this basename: CKTOTAL, CKMB, CKMBINDEX, TROPONINI,  in the last 8760 hours BNP: No components found with this basename: POCBNP,  CBG: No results found for this basename: GLUCAP,  in the last 8760 hours TSH:  Recent Labs  10/27/13 0500 10/28/13 0500  TSH 0.319* 0.252*  Radiological Exams: Dg Chest Port 1 View  10/27/2013   CLINICAL DATA:  Respiratory failure  EXAM: Portable exam 0712 hr compared to 10/26/2013  COMPARISON:  None.  FINDINGS: Right jugular line tip projects over SVC.  Normal heart size, mediastinal contours, and pulmonary vascularity.  Calcified pleural plaques in left chest.  Minimal left  basilar atelectasis.  Persistent tiny right pleural effusion and right lower lobe atelectasis.  Probable partial atelectasis in right middle lobe as well.  No pneumothorax or acute osseous findings.  IMPRESSION: Persistent right lower and partial right middle lobe atelectasis.   Electronically Signed   By: Ulyses Southward M.D.   On: 10/27/2013 08:14   Assessment/Plan There are no diagnoses linked to this encounter.  Labs/tests ordered

## 2014-04-09 NOTE — Progress Notes (Signed)
Patient ID: Elijah Ramos, male   DOB: 02-Sep-1925, 80 y.o.   MRN: [id number]    Nursing Home Location:  [facility]   Place of Service: SNF (31)  PCP: No primary provider on file.  Allergies  Allergen Reactions  . Aleve [Naproxen Sodium]   . Beta Adrenergic Blockers   . Penicillins   . Sulfa Antibiotics     Chief Complaint  Patient presents with  . Medical Management of Chronic Issues    HPI:  Patient is 80 y.o. male with a pmh of CKD, deafness, dementia, dysphagia, CVA, hx of DVT, who is being seen today for routine follow up on chronic conditions.there has been no changes noted from staff. Pt doing well without worsening of chronic conditions.  Pt unable to provide HPI or ROS due to hearing loss.    Review of Systems:  Review of Systems  Unable to perform ROS: Other  pt is deaf   Past Medical History  Diagnosis Date  . Protein calorie malnutrition   . Dysphagia   . CHF (congestive heart failure)   . CKD (chronic kidney disease)   . Dementia without behavioral disturbance   . Hypertension   . Personal history of DVT (deep vein thrombosis)   . H/O: alcohol abuse   . Stroke   . PNA (pneumonia)     RLL  . H/O acute respiratory failure   . Deafness, bilateral     reads lips  . Hyperkalemia   . Bilateral chronic knee pain   . Late effects of CVA (cerebrovascular accident)    No past surgical history on file. Social History:   reports that he drinks alcohol. His tobacco and drug histories are not on file.  No family history on file.  Medications: Patient's Medications  New Prescriptions   No medications on file  Previous Medications   ACIDOPHILUS (RISAQUAD) CAPS CAPSULE    Take 1 capsule by mouth 2 (two) times daily. For digestive health   ASPIRIN 81 MG TABLET    Take 81 mg by mouth daily. For hx cva   ATORVASTATIN (LIPITOR) 10 MG TABLET    Take 5 mg by mouth daily. For CHF   CETIRIZINE (ZYRTEC ALLERGY) 10 MG TABLET    Take 10 mg by mouth  at bedtime. For allergies   CLOPIDOGREL (PLAVIX) 75 MG TABLET    Take 75 mg by mouth daily with breakfast. For CVA   DOCUSATE SODIUM (COLACE) 100 MG CAPSULE    Take 100 mg by mouth 2 (two) times daily. For constipation   DONEPEZIL (ARICEPT) 10 MG TABLET    Take 10 mg by mouth at bedtime. For dementia   FLUTICASONE (FLONASE) 50 MCG/ACT NASAL SPRAY    Place 2 sprays into both nostrils daily.    FOLIC ACID (FOLVITE) 1 MG TABLET    Take 1 mg by mouth daily.   NAMENDA 5 MG TABLET    Take 5 mg by mouth daily. For dementia   OMEPRAZOLE (PRILOSEC) 40 MG CAPSULE    Take 40 mg by mouth daily.   OXYCODONE (OXY-IR) 5 MG CAPSULE    Take 5 mg by mouth every 6 (six) hours as needed. For moderate to severe pain   PANTOPRAZOLE (PROTONIX) 40 MG TABLET    Take 40 mg by mouth daily.   POLYETHYLENE GLYCOL (MIRALAX / GLYCOLAX) PACKET    Take 17 g by mouth daily. For bowel health   SODIUM BICARBONATE 650 MG TABLET  Take 650 mg by mouth 2 (two) times daily. For chronic kidney disease   THIAMINE 100 MG TABLET    Take 100 mg by mouth daily. For supplement  Modified Medications   No medications on file  Discontinued Medications   No medications on file     Physical Exam: Filed Vitals:   04/09/14 1404  BP: 102/75  Pulse: 60  Temp: 98.4 F (36.9 C)  Resp: 18  Weight: 160 lb (72.576 kg)    Physical Exam  Constitutional: He appears well-developed and well-nourished. No distress.  HENT:  Head: Normocephalic and atraumatic.  Cardiovascular: Normal rate, regular rhythm and normal heart sounds.   Pulmonary/Chest: Effort normal and breath sounds normal.  Abdominal: Soft. Bowel sounds are normal.  Musculoskeletal: He exhibits no edema and no tenderness.  Neurological: He is alert.  Skin: Skin is warm and dry. He is not diaphoretic.  Psychiatric: He has a normal mood and affect.    Labs reviewed: Basic Metabolic Panel:  Recent Labs  08/65/78 0500  11/06/13 0700  11/09/13 0815 11/11/13 0731  11/12/13 1256 11/13/13 0805 11/16/13 0717  NA 146  < > 142  < > 144 147 144 149* 145  K 4.2  < > 4.5  < > 5.2 5.7* 5.7* 4.7 5.9*  CL 105  < > 100  < > 104 108 105 108 110  CO2 24  < > 31  < > GLUCOSE 94  < > 95  < > 76 82 93 87 81  BUN 79*  < > 20  < > 43* 47* 48* 44* 44*  CREATININE 3.09*  < > 2.43*  < > 3.63* 3.46* 3.50* 3.41* 2.94*  CALCIUM 8.7  < > 8.4  < > 8.4 8.8 8.5 8.5 8.7  MG 2.1  --   --   --  2.3 2.4  --   --   --   PHOS 4.2  < > 4.0  --  5.3* 4.9*  --   --   --   < > = values in this interval not displayed. Liver Function Tests:  Recent Labs  10/27/13 0500 11/02/13 0503  11/04/13 0640 11/06/13 0700 11/16/13 0717  AST 11 14  --   --   --  17  ALT 9 12  --   --   --  12  ALKPHOS 63 72  --   --   --  68  BILITOT <0.2* <0.2*  --   --   --  <0.2*  PROT 6.1 6.2  --   --   --  6.3  ALBUMIN 2.6* 2.8*  < > 2.6* 2.8* 2.8*  < > = values in this interval not displayed. No results found for this basename: LIPASE, AMYLASE,  in the last 8760 hours No results found for this basename: AMMONIA,  in the last 8760 hours CBC:  Recent Labs  11/02/13 0503  11/09/13 0815 11/11/13 0731 11/13/13 0805 11/16/13 0717  WBC 11.4*  < > 6.4 5.9 6.2 4.8  NEUTROABS 9.4*  --  3.9 3.2  --   --   HGB 11.3*  < > 11.8* 11.4* 11.1* 11.0*  HCT 35.5*  < > 37.7* 37.0* 36.4* 35.9*  MCV 91.5  < > 94.5 95.6 96.6 95.5  PLT 194  < > 141* 163 167 183  < > = values in this interval not displayed. TSH:  Recent Labs  10/27/13 0500 10/28/13 0500  TSH 0.319* 0.252*    Assessment/Plan  1. Allergic rhinitis, unspecified allergic rhinitis type -without symptoms on current medications  2. Chronic congestive heart failure, unspecified congestive heart failure type Heart failure remains stable, no signs of fluid overload, weight has been stable, tolerating medications   3. Essential hypertension Patients blood pressure is stable; continue current regimen. Will monitor and make  changes as necessary.  4. Dementia without behavioral disturbance - pt is stable, no significant cognitive or decline  5. CKD (chronic kidney disease), unspecified stage Will update labs, cbc and bm

## 2014-04-29 ENCOUNTER — Non-Acute Institutional Stay (SKILLED_NURSING_FACILITY): Payer: Medicare Other | Admitting: Internal Medicine

## 2014-04-29 ENCOUNTER — Encounter: Payer: Self-pay | Admitting: Internal Medicine

## 2014-04-29 DIAGNOSIS — R1310 Dysphagia, unspecified: Secondary | ICD-10-CM

## 2014-04-29 DIAGNOSIS — I69391 Dysphagia following cerebral infarction: Secondary | ICD-10-CM

## 2014-04-29 DIAGNOSIS — D509 Iron deficiency anemia, unspecified: Secondary | ICD-10-CM

## 2014-04-29 DIAGNOSIS — I6990 Unspecified sequelae of unspecified cerebrovascular disease: Secondary | ICD-10-CM

## 2014-04-29 DIAGNOSIS — F0390 Unspecified dementia without behavioral disturbance: Secondary | ICD-10-CM

## 2014-04-29 DIAGNOSIS — I10 Essential (primary) hypertension: Secondary | ICD-10-CM

## 2014-04-29 DIAGNOSIS — N183 Chronic kidney disease, stage 3 unspecified: Secondary | ICD-10-CM

## 2014-04-29 DIAGNOSIS — Z86718 Personal history of other venous thrombosis and embolism: Secondary | ICD-10-CM

## 2014-04-29 NOTE — Assessment & Plan Note (Signed)
Continue ASA 81 mg;was on plavix prior, has been d/c

## 2014-04-29 NOTE — Progress Notes (Signed)
MRN: [id number] Name: Elijah SarasWilliam R Ramos  Sex: male Age: 80 y.o. DOB: 06/10/1926  [facility] Facility/Room: 129A Level Of Care: SNF Provider: Merrilee SeashoreALEXANDER, Aaryanna Hyden D Emergency Contacts: Extended Emergency Contact Information Primary Emergency Contact: Stmartin,[name] Address: [address]          [place], [place] [address] [place] Home Phone: [phone] Mobile Phone: [phone] Relation: Son Secondary Emergency Contact: Salley,[name]  [place] Mobile Phone: [phone] Relation: Other    Allergies: Aleve; Beta adrenergic blockers; Penicillins; and Sulfa antibiotics  Chief Complaint  Patient presents with  . Medical Management of Chronic Issues    HPI: Patient is 80 y.o. male who is being seen for routine issues.  Past Medical History  Diagnosis Date  . Protein calorie malnutrition   . Dysphagia   . CHF (congestive heart failure)   . CKD (chronic kidney disease)   . Dementia without behavioral disturbance   . Hypertension   . Personal history of DVT (deep vein thrombosis)   . H/O: alcohol abuse   . Stroke   . PNA (pneumonia)     RLL  . H/O acute respiratory failure   . Deafness, bilateral     reads lips  . Hyperkalemia   . Bilateral chronic knee pain   . Late effects of CVA (cerebrovascular accident)     History reviewed. No pertinent past surgical history.    Medication List       This list is accurate as of: 04/29/14  7:14 PM.  Always use your most recent med list.               acidophilus Caps capsule  Take 1 capsule by mouth 2 (two) times daily. For digestive health     aspirin 81 MG tablet  Take 81 mg by mouth daily. For hx cva     atorvastatin 10 MG tablet  Commonly known as:  LIPITOR  Take 5 mg by mouth daily. For CHF     docusate sodium 100 MG capsule  Commonly known as:  COLACE  Take 100 mg by mouth 2 (two) times daily. For constipation     donepezil 10 MG tablet  Commonly known as:  ARICEPT   Take 10 mg by mouth at bedtime. For dementia     fluticasone 50 MCG/ACT nasal spray  Commonly known as:  FLONASE  Place 2 sprays into both nostrils daily.     folic acid 1 MG tablet  Commonly known as:  FOLVITE  Take 1 mg by mouth daily.     NAMENDA 5 MG tablet  Generic drug:  memantine  Take 5 mg by mouth daily. For dementia     omeprazole 40 MG capsule  Commonly known as:  PRILOSEC  Take 40 mg by mouth daily.     oxycodone 5 MG capsule  Commonly known as:  OXY-IR  Take 5 mg by mouth every 6 (six) hours as needed. For moderate to severe pain     pantoprazole 40 MG tablet  Commonly known as:  PROTONIX  Take 40 mg by mouth daily.     polyethylene glycol packet  Commonly known as:  MIRALAX / GLYCOLAX  Take 17 g by mouth daily. For bowel health     sodium bicarbonate 650 MG tablet  Take 650 mg by mouth 2 (two) times daily. For chronic kidney disease     thiamine 100 MG tablet  Take 100 mg by mouth daily. For supplement  ZYRTEC ALLERGY 10 MG tablet  Generic drug:  cetirizine  Take 10 mg by mouth at bedtime. For allergies        No orders of the defined types were placed in this encounter.     There is no immunization history on file for this patient.  History  Substance Use Topics  . Smoking status: Unknown If Ever Smoked  . Smokeless tobacco: Not on file  . Alcohol Use: Yes     Comment: former abuse    Review of Systems  DATA OBTAINED: from patient; pt nods no;[facility] GENERAL:  no fevers, fatigue, appetite changes SKIN: No itching, rash HEENT: No complaint RESPIRATORY: No cough, wheezing, SOB CARDIAC: No chest pain, palpitations, lower extremity edema  GI: No abdominal pain, No N/V/D or constipation, No heartburn or reflux  GU: No dysuria, frequency or urgency, or incontinence  MUSCULOSKELETAL: No unrelieved bone/joint pain NEUROLOGIC: No headache, dizziness  PSYCHIATRIC: No overt anxiety or sadness  Filed Vitals:   04/29/14 1555   BP: 113/74  Pulse: 54  Temp: 96.9 F (36.1 C)  Resp: 18    Physical Exam  GENERAL APPEARANCE: Alert,non conversant, No acute distress  SKIN: No diaphoresis rash HEENT: Unremarkable RESPIRATORY: Breathing is even, unlabored. Lung sounds are clear   CARDIOVASCULAR: Heart RRR no murmurs, rubs or gallops. No peripheral edema  GASTROINTESTINAL: Abdomen is soft, non-tender, not distended w/ normal bowel sounds.  GENITOURINARY: Bladder non tender, not distended  MUSCULOSKELETAL: No abnormal joints or musculature NEUROLOGIC: Cranial nerves 2-12 grossly intact. Moves all extremities PSYCHIATRIC:  no behavioral issues  Patient Active Problem List   Diagnosis Date Noted  . Dysphagia S/P CVA (cerebrovascular accident) 04/29/2014  . Anemia, iron deficiency 04/29/2014  . Allergic rhinitis 01/29/2014  . Unspecified constipation 01/29/2014  . Late effects of CVA (cerebrovascular accident)   . Protein calorie malnutrition   . Dysphagia   . Chronic CHF (congestive heart failure)   . CKD (chronic kidney disease) stage 3, GFR 30-59 ml/min   . Dementia without behavioral disturbance   . Hypertension   . Personal history of DVT (deep vein thrombosis)   . H/O: alcohol abuse   . PNA (pneumonia)   . H/O acute respiratory failure   . Deafness, bilateral   . Bilateral chronic knee pain     CBC    Component Value Date/Time   WBC 4.8 11/16/2013 0717   RBC 3.76* 11/16/2013 0717   HGB 11.0* 11/16/2013 0717   HCT 35.9* 11/16/2013 0717   PLT 183 11/16/2013 0717   MCV 95.5 11/16/2013 0717   LYMPHSABS 1.8 11/11/2013 0731   MONOABS 0.7 11/11/2013 0731   EOSABS 0.1 11/11/2013 0731   BASOSABS 0.0 11/11/2013 0731    CMP     Component Value Date/Time   NA 145 11/16/2013 0717   K 5.9* 11/16/2013 0717   CL 110 11/16/2013 0717   CO2 26 11/16/2013 0717   GLUCOSE 81 11/16/2013 0717   BUN 44* 11/16/2013 0717   CREATININE 2.94* 11/16/2013 0717   CALCIUM 8.7 11/16/2013 0717   PROT 6.3 11/16/2013 0717   ALBUMIN 2.8* 11/16/2013  0717   AST 17 11/16/2013 0717   ALT 12 11/16/2013 0717   ALKPHOS 68 11/16/2013 0717   BILITOT <0.2* 11/16/2013 0717   GFRNONAA 18* 11/16/2013 0717   GFRAA 21* 11/16/2013 0717    Assessment and Plan  Dysphagia S/P CVA (cerebrovascular accident) Pt was on ASA and plavix for prevention but recently plavix d/c at  pharm suggestion and pt on ASA 81 mg as prophylaxis  Dysphagia mecahincal soft diet with thin liquids;no aspirations   Anemia, iron deficiency In 04/12/2014 Hb 8.4 with MCV 82.3; with h/o macrocytic anemia this represents iron def with low ferritin in 10/2013; will start daily iron  CKD (chronic kidney disease) stage 3, GFR 30-59 ml/min BUN 41/Cr 2.52 which is pretty close to prior in   Late effects of CVA (cerebrovascular accident) Continue ASA 81 mg;was on plavix prior, has been d/c  Hypertension Good control on no meds  Dementia without behavioral disturbance Communication is difficult-pt HOH - dementia does not appear severe;cont aricept and namenda  Personal history of DVT (deep vein thrombosis) Pt on ASA 81 mg daily now    Margit Hanks, MD

## 2014-04-29 NOTE — Assessment & Plan Note (Signed)
Communication is difficult-pt HOH - dementia does not appear severe;cont aricept and namenda

## 2014-04-29 NOTE — Assessment & Plan Note (Signed)
In 04/12/2014 Hb 8.4 with MCV 82.3; with h/o macrocytic anemia this represents iron def with low ferritin in 10/2013; will start daily iron

## 2014-04-29 NOTE — Assessment & Plan Note (Signed)
BUN 41/Cr 2.52 which is pretty close to prior in

## 2014-04-29 NOTE — Assessment & Plan Note (Signed)
Good control on no meds. 

## 2014-04-29 NOTE — Assessment & Plan Note (Signed)
mecahincal soft diet with thin liquids;no aspirations

## 2014-04-29 NOTE — Assessment & Plan Note (Signed)
Pt was on ASA and plavix for prevention but recently plavix d/c at pharm suggestion and pt on ASA 81 mg as prophylaxis

## 2014-04-29 NOTE — Assessment & Plan Note (Signed)
Pt on ASA 81 mg daily now

## 2014-06-04 ENCOUNTER — Non-Acute Institutional Stay (SKILLED_NURSING_FACILITY): Payer: Medicare Other | Admitting: Nurse Practitioner

## 2014-06-04 DIAGNOSIS — I509 Heart failure, unspecified: Secondary | ICD-10-CM

## 2014-06-04 DIAGNOSIS — N183 Chronic kidney disease, stage 3 unspecified: Secondary | ICD-10-CM

## 2014-06-04 DIAGNOSIS — G8929 Other chronic pain: Secondary | ICD-10-CM

## 2014-06-04 DIAGNOSIS — M25562 Pain in left knee: Secondary | ICD-10-CM

## 2014-06-04 DIAGNOSIS — M25561 Pain in right knee: Secondary | ICD-10-CM

## 2014-06-04 DIAGNOSIS — I10 Essential (primary) hypertension: Secondary | ICD-10-CM

## 2014-06-04 DIAGNOSIS — F0390 Unspecified dementia without behavioral disturbance: Secondary | ICD-10-CM

## 2014-06-04 NOTE — Progress Notes (Signed)
Patient ID: Elijah Ramos, male   DOB: [birth year], 80 y.o.   MRN: [id number]    Nursing Home Location:  [place] of Service: SNF (31)  PCP: No primary care provider on file.  Allergies  Allergen Reactions  . Aleve [Naproxen Sodium]   . Beta Adrenergic Blockers   . Penicillins   . Sulfa Antibiotics     Chief Complaint  Patient presents with  . Medical Management of Chronic Issues    HPI:  Patient is a 80 y.o. male seen today at [facility] for routine follow up on chronic conditions. Pt with a pmh of CKD, deafness, dementia, dysphagia, CVA, hx of DVT. Pt has been stable in the last month without any acute issues. Staff has no concerns at this time and reports he remains in his usual state of health.   Review of Systems:  Review of Systems  Unable to perform ROS pt is severely HOH unable to perform ROS  Past Medical History  Diagnosis Date  . Protein calorie malnutrition   . Dysphagia   . CHF (congestive heart failure)   . CKD (chronic kidney disease)   . Dementia without behavioral disturbance   . Hypertension   . Personal history of DVT (deep vein thrombosis)   . H/O: alcohol abuse   . Stroke   . PNA (pneumonia)     RLL  . H/O acute respiratory failure   . Deafness, bilateral     reads lips  . Hyperkalemia   . Bilateral chronic knee pain   . Late effects of CVA (cerebrovascular accident)    No past surgical history on file. Social History:   reports that he drinks alcohol. His tobacco and drug histories are not on file.  No family history on file.  Medications: Patient's Medications  New Prescriptions   No medications on file  Previous Medications   ACIDOPHILUS (RISAQUAD) CAPS CAPSULE    Take 1 capsule by mouth 2 (two) times daily. For digestive health   ASPIRIN 81 MG TABLET    Take 81 mg by mouth daily. For hx cva   ATORVASTATIN (LIPITOR) 10 MG TABLET    Take 5 mg by mouth daily. For CHF   CETIRIZINE (ZYRTEC  ALLERGY) 10 MG TABLET    Take 10 mg by mouth at bedtime. For allergies   DOCUSATE SODIUM (COLACE) 100 MG CAPSULE    Take 100 mg by mouth 2 (two) times daily. For constipation   DONEPEZIL (ARICEPT) 10 MG TABLET    Take 10 mg by mouth at bedtime. For dementia   FLUTICASONE (FLONASE) 50 MCG/ACT NASAL SPRAY    Place 2 sprays into both nostrils daily.    FOLIC ACID (FOLVITE) 1 MG TABLET    Take 1 mg by mouth daily.   NAMENDA 5 MG TABLET    Take 5 mg by mouth daily. For dementia   OMEPRAZOLE (PRILOSEC) 40 MG CAPSULE    Take 40 mg by mouth daily.   OXYCODONE (OXY-IR) 5 MG CAPSULE    Take 5 mg by mouth every 6 (six) hours as needed. For moderate to severe pain   PANTOPRAZOLE (PROTONIX) 40 MG TABLET    Take 40 mg by mouth daily.   POLYETHYLENE GLYCOL (MIRALAX / GLYCOLAX) PACKET    Take 17 g by mouth daily. For bowel health   SODIUM BICARBONATE 650 MG TABLET    Take 650 mg by mouth 2 (two) times daily. For chronic  kidney disease   THIAMINE 100 MG TABLET    Take 100 mg by mouth daily. For supplement  Modified Medications   No medications on file  Discontinued Medications   No medications on file     Physical Exam: Filed Vitals:   06/04/14 1632  BP: 133/74  Pulse: 55  Temp: 97.4 F (36.3 C)  Resp: 20    Physical Exam  Constitutional: He appears well-developed and well-nourished. No distress.  HENT:  Head: Normocephalic and atraumatic.  Neck: Normal range of motion. Neck supple.  Cardiovascular: Normal rate, regular rhythm and normal heart sounds.   Pulmonary/Chest: Effort normal and breath sounds normal.  Abdominal: Soft. Bowel sounds are normal.  Musculoskeletal: He exhibits no edema or tenderness.  Neurological: He is alert.  Skin: Skin is warm and dry. He is not diaphoretic.  Psychiatric: He has a normal mood and affect.    Labs reviewed: Basic Metabolic Panel:  Recent Labs  10/28/13 0500  11/06/13 0700  11/09/13 0815 11/11/13 0731 11/12/13 1256 11/13/13 0805  11/16/13 0717  NA 146  < > 142  < > 144 147 144 149* 145  K 4.2  < > 4.5  < > 5.2 5.7* 5.7* 4.7 5.9*  CL 105  < > 100  < > 104 108 105 108 110  CO2 24  < > 31  < > _0 GLUCOSE 94  < > 95  < > 76 82 93 87 81  BUN 79*  < > 20  < > 43* 47* 48* 44* 44*  CREATININE 3.09*  < > 2.43*  < > 3.63* 3.46* 3.50* 3.41* 2.94*  CALCIUM 8.7  < > 8.4  < > 8.4 8.8 8.5 8.5 8.7  MG 2.1  --   --   --  2.3 2.4  --   --   --   PHOS 4.2  < > 4.0  --  5.3* 4.9*  --   --   --   < > = values in this interval not displayed. Liver Function Tests:  Recent Labs  10/27/13 0500 11/02/13 0503  11/04/13 0640 11/06/13 0700 11/16/13 0717  AST 11 14  --   --   --  17  ALT 9 12  --   --   --  12  ALKPHOS 63 72  --   --   --  68  BILITOT <0.2* <0.2*  --   --   --  <0.2*  PROT 6.1 6.2  --   --   --  6.3  ALBUMIN 2.6* 2.8*  < > 2.6* 2.8* 2.8*  < > = values in this interval not displayed. No results for input(s): LIPASE, AMYLASE in the last 8760 hours. No results for input(s): AMMONIA in the last 8760 hours. CBC:  Recent Labs  11/02/13 0503  11/09/13 0815 11/11/13 0731 11/13/13 0805 11/16/13 0717  WBC 11.4*  < > 6.4 5.9 6.2 4.8  NEUTROABS 9.4*  --  3.9 3.2  --   --   HGB 11.3*  < > 11.8* 11.4* 11.1* 11.0*  HCT 35.5*  < > 37.7* 37.0* 36.4* 35.9*  MCV 91.5  < > 94.5 95.6 96.6 95.5  PLT 194  < > 141* 163 167 183  < > = values in this interval not displayed. TSH:  Recent Labs  10/27/13 0500 10/28/13 0500  TSH 0.319* 0.252*   A1C: No results found for: HGBA1C Lipid Panel: No results for  input(s): CHOL, HDL, LDLCALC, TRIG, CHOLHDL, LDLDIRECT in the last 8760 hours. CBC with Diff    Result: 05/04/2014 5:12 PM   ( Status: F )       WBC 5.1     4.0-10.5 K/uL SLN   RBC 3.33   L 4.22-5.81 MIL/uL SLN   Hemoglobin 8.3   L 13.0-17.0 g/dL SLN   Hematocrit 27.1   L 39.0-52.0 % SLN   MCV 81.4     78.0-100.0 fL SLN   MCH 24.9   L 26.0-34.0 pg SLN   MCHC 30.6     30.0-36.0 g/dL SLN   RDW 16.1    H 11.5-15.5 % SLN   Platelet Count 217     150-400 K/uL SLN   Granulocyte % 39   L 43-77 % SLN   Absolute Gran 2.0     1.7-7.7 K/uL SLN   Lymph % 43     12-46 % SLN   Absolute Lymph 2.2     0.7-4.0 K/uL SLN   Mono % 12     3-12 % SLN   Absolute Mono 0.6     0.1-1.0 K/uL SLN   Eos % 5     0-5 % SLN   Absolute Eos 0.3     0.0-0.7 K/uL SLN   Baso % 1     0-1 % SLN   Absolute Baso 0.1     0.0-0.1 K/uL SLN   Smear Review Criteria for review not met  SLN   Basic Metabolic Panel    Result: 05/04/2014 4:32 PM   ( Status: F )       Sodium 139     135-145 mEq/L SLN   Potassium 5.7   H 3.5-5.3 mEq/L SLN C Chloride 111     96-112 mEq/L SLN   CO2 21     19-32 mEq/L SLN   Glucose 84     70-99 mg/dL SLN   BUN 38   H 6-23 mg/dL SLN   Creatinine 2.53   H 0.50-1.35 mg/dL SLN   Calcium 8.1   L 8.4-10.5 mg/dL SLN   Iron    Result: 05/04/2014 4:32 PM   ( Status: F )       Iron 62     42-165 ug/dL SLN   TIBC    Result: 05/04/2014 4:32 PM   ( Status: F )       UIBC 184     125-400 ug/dL SLN   TIBC 246     215-435 ug/dL SLN   %SAT 25     20-55 % SLN   Ferritin    Result: 05/04/2014 3:38 PM   ( Status: F )       Ferritin 43     22-322 ng/mL SLN   Transferrin    Result: 05/04/2014 7:21 PM   ( Status: F )       Transferrin 197   L 200-360 mg/dL SLN Basic Metabolic Panel    Result: 05/08/2014 4:30 PM   ( Status: F )       Sodium 140     135-145 mEq/L SLN   Potassium 5.3     3.5-5.3 mEq/L SLN   Chloride 112     96-112 mEq/L SLN   CO2 21     19-32 mEq/L SLN   Glucose 103   H 70-99 mg/dL SLN   BUN 41   H 6-23 mg/dL SLN   Creatinine 2.76   H 0.50-1.35  mg/dL SLN   Calcium 7.9   L 8.4-10.5 mg/dL SLN   Assessment/Plan 1. Essential hypertension Stable at this time will cont current regimen  2. Chronic congestive heart failure, unspecified congestive heart failure type Remains stable.  3. Dementia without behavioral disturbance -conts on namenda and aricept. cognitive and functional status has  been stable   4. Bilateral chronic knee pain -currently on PRN medications for this. Will cont to monitor at this time  5. CKD Cr remains at baseline, conts sodium bicarbonate    Labs/tests ordered

## 2014-07-06 ENCOUNTER — Non-Acute Institutional Stay (SKILLED_NURSING_FACILITY): Payer: Medicare Other | Admitting: Nurse Practitioner

## 2014-07-06 DIAGNOSIS — D509 Iron deficiency anemia, unspecified: Secondary | ICD-10-CM

## 2014-07-06 DIAGNOSIS — I509 Heart failure, unspecified: Secondary | ICD-10-CM

## 2014-07-06 DIAGNOSIS — K5909 Other constipation: Secondary | ICD-10-CM

## 2014-07-06 DIAGNOSIS — F0390 Unspecified dementia without behavioral disturbance: Secondary | ICD-10-CM

## 2014-07-06 DIAGNOSIS — I10 Essential (primary) hypertension: Secondary | ICD-10-CM

## 2014-07-06 NOTE — Progress Notes (Signed)
Patient ID: Elijah Ramos, male   DOB: 1925-07-28, 80 y.o.   MRN: [id number]    Nursing Home Location:  [place] of Service: SNF (31)  PCP: No primary care provider on file.  Allergies  Allergen Reactions  . Aleve [Naproxen Sodium]   . Beta Adrenergic Blockers   . Penicillins   . Sulfa Antibiotics     Chief Complaint  Patient presents with  . Medical Management of Chronic Issues    HPI:  Patient is a 80 y.o. male seen today at [facility] for routine follow up on chronic conditions. Pt with a pmh of CKD, deafness, dementia, dysphagia, CVA, hx of DVT. Pt is very HOH making ROS difficult to obtain. No complaints per pt. Staff reports his chronic conditions remain at baseline.   Review of Systems:  Review of Systems  Unable to perform ROS pt is severely HOH unable to perform ROS  Past Medical History  Diagnosis Date  . Protein calorie malnutrition   . Dysphagia   . CHF (congestive heart failure)   . CKD (chronic kidney disease)   . Dementia without behavioral disturbance   . Hypertension   . Personal history of DVT (deep vein thrombosis)   . H/O: alcohol abuse   . Stroke   . PNA (pneumonia)     RLL  . H/O acute respiratory failure   . Deafness, bilateral     reads lips  . Hyperkalemia   . Bilateral chronic knee pain   . Late effects of CVA (cerebrovascular accident)    No past surgical history on file. Social History:   reports that he drinks alcohol. His tobacco and drug histories are not on file.  No family history on file.  Medications: Patient's Medications  New Prescriptions   No medications on file  Previous Medications   ACIDOPHILUS (RISAQUAD) CAPS CAPSULE    Take 1 capsule by mouth 2 (two) times daily. For digestive health   ASPIRIN 81 MG TABLET    Take 81 mg by mouth daily. For hx cva   ATORVASTATIN (LIPITOR) 10 MG TABLET    Take 5 mg by mouth daily. For CHF   CETIRIZINE (ZYRTEC ALLERGY) 10 MG TABLET     Take 10 mg by mouth at bedtime. For allergies   DOCUSATE SODIUM (COLACE) 100 MG CAPSULE    Take 100 mg by mouth 2 (two) times daily. For constipation   DONEPEZIL (ARICEPT) 10 MG TABLET    Take 10 mg by mouth at bedtime. For dementia   FLUTICASONE (FLONASE) 50 MCG/ACT NASAL SPRAY    Place 2 sprays into both nostrils daily.    FOLIC ACID (FOLVITE) 1 MG TABLET    Take 1 mg by mouth daily.   NAMENDA 5 MG TABLET    Take 5 mg by mouth daily. For dementia   OMEPRAZOLE (PRILOSEC) 40 MG CAPSULE    Take 40 mg by mouth daily.   OXYCODONE (OXY-IR) 5 MG CAPSULE    Take 5 mg by mouth every 6 (six) hours as needed. For moderate to severe pain   PANTOPRAZOLE (PROTONIX) 40 MG TABLET    Take 40 mg by mouth daily.   POLYETHYLENE GLYCOL (MIRALAX / GLYCOLAX) PACKET    Take 17 g by mouth daily. For bowel health   SODIUM BICARBONATE 650 MG TABLET    Take 650 mg by mouth 2 (two) times daily. For chronic kidney disease   THIAMINE 100 MG TABLET  Take 100 mg by mouth daily. For supplement  Modified Medications   No medications on file  Discontinued Medications   No medications on file     Physical Exam: Filed Vitals:   07/06/14 1440  BP: 132/76  Pulse: 80  Temp: 97.6 F (36.4 C)  Resp: 20  Weight: 162 lb (73.483 kg)    Physical Exam  Constitutional: He appears well-developed and well-nourished. No distress.  HENT:  Head: Normocephalic and atraumatic.  Neck: Normal range of motion. Neck supple.  Cardiovascular: Normal rate, regular rhythm and normal heart sounds.   Pulmonary/Chest: Effort normal and breath sounds normal.  Abdominal: Soft. Bowel sounds are normal.  Musculoskeletal: He exhibits no edema or tenderness.  Neurological: He is alert.  Skin: Skin is warm and dry. He is not diaphoretic.  Psychiatric: He has a normal mood and affect.    Labs reviewed: Basic Metabolic Panel:  Recent Labs  10/28/13 0500  11/06/13 0700  11/09/13 0815 11/11/13 0731 11/12/13 1256 11/13/13 0805  11/16/13 0717  NA 146  < > 142  < > 144 147 144 149* 145  K 4.2  < > 4.5  < > 5.2 5.7* 5.7* 4.7 5.9*  CL 105  < > 100  < > 104 108 105 108 110  CO2 24  < > 31  < > '28 29 27 28 26  ' GLUCOSE 94  < > 95  < > 76 82 93 87 81  BUN 79*  < > 20  < > 43* 47* 48* 44* 44*  CREATININE 3.09*  < > 2.43*  < > 3.63* 3.46* 3.50* 3.41* 2.94*  CALCIUM 8.7  < > 8.4  < > 8.4 8.8 8.5 8.5 8.7  MG 2.1  --   --   --  2.3 2.4  --   --   --   PHOS 4.2  < > 4.0  --  5.3* 4.9*  --   --   --   < > = values in this interval not displayed. Liver Function Tests:  Recent Labs  10/27/13 0500 11/02/13 0503  11/04/13 0640 11/06/13 0700 11/16/13 0717  AST 11 14  --   --   --  17  ALT 9 12  --   --   --  12  ALKPHOS 63 72  --   --   --  68  BILITOT <0.2* <0.2*  --   --   --  <0.2*  PROT 6.1 6.2  --   --   --  6.3  ALBUMIN 2.6* 2.8*  < > 2.6* 2.8* 2.8*  < > = values in this interval not displayed. No results for input(s): LIPASE, AMYLASE in the last 8760 hours. No results for input(s): AMMONIA in the last 8760 hours. CBC:  Recent Labs  11/02/13 0503  11/09/13 0815 11/11/13 0731 11/13/13 0805 11/16/13 0717  WBC 11.4*  < > 6.4 5.9 6.2 4.8  NEUTROABS 9.4*  --  3.9 3.2  --   --   HGB 11.3*  < > 11.8* 11.4* 11.1* 11.0*  HCT 35.5*  < > 37.7* 37.0* 36.4* 35.9*  MCV 91.5  < > 94.5 95.6 96.6 95.5  PLT 194  < > 141* 163 167 183  < > = values in this interval not displayed. TSH:  Recent Labs  10/27/13 0500 10/28/13 0500  TSH 0.319* 0.252*   A1C: No results found for: HGBA1C Lipid Panel: No results for input(s): CHOL, HDL, LDLCALC, TRIG,  CHOLHDL, LDLDIRECT in the last 8760 hours. CBC with Diff    Result: 05/04/2014 5:12 PM   ( Status: F )       WBC 5.1     4.0-10.5 K/uL SLN   RBC 3.33   L 4.22-5.81 MIL/uL SLN   Hemoglobin 8.3   L 13.0-17.0 g/dL SLN   Hematocrit 27.1   L 39.0-52.0 % SLN   MCV 81.4     78.0-100.0 fL SLN   MCH 24.9   L 26.0-34.0 pg SLN   MCHC 30.6     30.0-36.0 g/dL SLN   RDW 16.1    H 11.5-15.5 % SLN   Platelet Count 217     150-400 K/uL SLN   Granulocyte % 39   L 43-77 % SLN   Absolute Gran 2.0     1.7-7.7 K/uL SLN   Lymph % 43     12-46 % SLN   Absolute Lymph 2.2     0.7-4.0 K/uL SLN   Mono % 12     3-12 % SLN   Absolute Mono 0.6     0.1-1.0 K/uL SLN   Eos % 5     0-5 % SLN   Absolute Eos 0.3     0.0-0.7 K/uL SLN   Baso % 1     0-1 % SLN   Absolute Baso 0.1     0.0-0.1 K/uL SLN   Smear Review Criteria for review not met  SLN   Basic Metabolic Panel    Result: 05/04/2014 4:32 PM   ( Status: F )       Sodium 139     135-145 mEq/L SLN   Potassium 5.7   H 3.5-5.3 mEq/L SLN C Chloride 111     96-112 mEq/L SLN   CO2 21     19-32 mEq/L SLN   Glucose 84     70-99 mg/dL SLN   BUN 38   H 6-23 mg/dL SLN   Creatinine 2.53   H 0.50-1.35 mg/dL SLN   Calcium 8.1   L 8.4-10.5 mg/dL SLN   Iron    Result: 05/04/2014 4:32 PM   ( Status: F )       Iron 62     42-165 ug/dL SLN   TIBC    Result: 05/04/2014 4:32 PM   ( Status: F )       UIBC 184     125-400 ug/dL SLN   TIBC 246     215-435 ug/dL SLN   %SAT 25     20-55 % SLN   Ferritin    Result: 05/04/2014 3:38 PM   ( Status: F )       Ferritin 43     22-322 ng/mL SLN   Transferrin    Result: 05/04/2014 7:21 PM   ( Status: F )       Transferrin 197   L 200-360 mg/dL SLN Basic Metabolic Panel    Result: 05/08/2014 4:30 PM   ( Status: F )       Sodium 140     135-145 mEq/L SLN   Potassium 5.3     3.5-5.3 mEq/L SLN   Chloride 112     96-112 mEq/L SLN   CO2 21     19-32 mEq/L SLN   Glucose 103   H 70-99 mg/dL SLN   BUN 41   H 6-23 mg/dL SLN   Creatinine 2.76   H 0.50-1.35 mg/dL SLN   Calcium  7.9   L 8.4-10.5 mg/dL SLN   Assessment/Plan 1. Anemia, iron deficiency conts on iron will follow up CBC next month  2. Other constipation Without symptoms at this time  3. Essential hypertension Currently controlled  4. Chronic congestive heart failure, unspecified congestive heart failure type -no worsening heart  failure noted.   5. Dementia without behavioral disturbance Currently on Aricept and namenda, will start namenda XR and increase to namenda XR 14 mg daily (max dose due to renal function)

## 2014-08-27 ENCOUNTER — Non-Acute Institutional Stay (SKILLED_NURSING_FACILITY): Payer: Medicare Other | Admitting: Nurse Practitioner

## 2014-08-27 DIAGNOSIS — I69391 Dysphagia following cerebral infarction: Secondary | ICD-10-CM

## 2014-08-27 DIAGNOSIS — N183 Chronic kidney disease, stage 3 unspecified: Secondary | ICD-10-CM

## 2014-08-27 DIAGNOSIS — M25562 Pain in left knee: Secondary | ICD-10-CM

## 2014-08-27 DIAGNOSIS — I10 Essential (primary) hypertension: Secondary | ICD-10-CM

## 2014-08-27 DIAGNOSIS — F0390 Unspecified dementia without behavioral disturbance: Secondary | ICD-10-CM

## 2014-08-27 DIAGNOSIS — D509 Iron deficiency anemia, unspecified: Secondary | ICD-10-CM

## 2014-08-27 DIAGNOSIS — G8929 Other chronic pain: Secondary | ICD-10-CM

## 2014-08-27 DIAGNOSIS — I509 Heart failure, unspecified: Secondary | ICD-10-CM

## 2014-08-27 DIAGNOSIS — M25561 Pain in right knee: Secondary | ICD-10-CM

## 2014-08-27 DIAGNOSIS — H9193 Unspecified hearing loss, bilateral: Secondary | ICD-10-CM

## 2014-08-27 NOTE — Progress Notes (Signed)
Patient ID: Elijah Ramos, male   DOB: Oct 27, 1925, 81 y.o.   MRN: [id number]    Nursing Home Location:  [place] of Service: SNF (31)  PCP: No primary care provider on file.  Allergies  Allergen Reactions  . Aleve [Naproxen Sodium]   . Beta Adrenergic Blockers   . Penicillins   . Sulfa Antibiotics     Chief Complaint  Patient presents with  . Medical Management of Chronic Issues    HPI:  Patient is a 81 y.o. male seen today at [facility] for routine follow up on chronic conditions. Pt with a pmh of CKD, deafness, dementia, dysphagia, CVA, hx of DVT. Pt is very HOH making ROS difficult to obtain. Pt working with restorative to walk. Restorative reports pt with complaints of knee pain when walking. Otherwise pt has been stable in the last month. Without acute issues and no excerebration of chronic conditions.     Review of Systems:  Review of Systems  Unable to perform ROS pt is severely HOH unable to perform ROS  Past Medical History  Diagnosis Date  . Protein calorie malnutrition   . Dysphagia   . CHF (congestive heart failure)   . CKD (chronic kidney disease)   . Dementia without behavioral disturbance   . Hypertension   . Personal history of DVT (deep vein thrombosis)   . H/O: alcohol abuse   . Stroke   . PNA (pneumonia)     RLL  . H/O acute respiratory failure   . Deafness, bilateral     reads lips  . Hyperkalemia   . Bilateral chronic knee pain   . Late effects of CVA (cerebrovascular accident)    No past surgical history on file. Social History:   reports that he drinks alcohol. His tobacco and drug histories are not on file.  No family history on file.  Medications: Patient's Medications  New Prescriptions   No medications on file  Previous Medications   ACIDOPHILUS (RISAQUAD) CAPS CAPSULE    Take 1 capsule by mouth 2 (two) times daily. For digestive health   ASPIRIN 81 MG TABLET    Take 81 mg by mouth  daily. For hx cva   ATORVASTATIN (LIPITOR) 10 MG TABLET    Take 5 mg by mouth daily. For CHF   CETIRIZINE (ZYRTEC ALLERGY) 10 MG TABLET    Take 10 mg by mouth at bedtime. For allergies   DOCUSATE SODIUM (COLACE) 100 MG CAPSULE    Take 100 mg by mouth 2 (two) times daily. For constipation   DONEPEZIL (ARICEPT) 10 MG TABLET    Take 10 mg by mouth at bedtime. For dementia   FLUTICASONE (FLONASE) 50 MCG/ACT NASAL SPRAY    Place 2 sprays into both nostrils daily.    FOLIC ACID (FOLVITE) 1 MG TABLET    Take 1 mg by mouth daily.   MEMANTINE (NAMENDA XR) 14 MG CP24 24 HR CAPSULE    Take 14 mg by mouth daily.   OMEPRAZOLE (PRILOSEC) 40 MG CAPSULE    Take 40 mg by mouth daily.   OXYCODONE (OXY-IR) 5 MG CAPSULE    Take 5 mg by mouth every 6 (six) hours as needed. For moderate to severe pain   PANTOPRAZOLE (PROTONIX) 40 MG TABLET    Take 40 mg by mouth daily.   POLYETHYLENE GLYCOL (MIRALAX / GLYCOLAX) PACKET    Take 17 g by mouth daily. For bowel health  SODIUM BICARBONATE 650 MG TABLET    Take 650 mg by mouth 2 (two) times daily. For chronic kidney disease   THIAMINE 100 MG TABLET    Take 100 mg by mouth daily. For supplement  Modified Medications   No medications on file  Discontinued Medications   NAMENDA 5 MG TABLET    Take 5 mg by mouth daily. For dementia     Physical Exam: Filed Vitals:   08/27/14 0952  BP: 136/61  Pulse: 82  Temp: 98.2 F (36.8 C)  Resp: 20  Weight: 164 lb (74.39 kg)    Physical Exam  Constitutional: He appears well-developed and well-nourished. No distress.  HENT:  Head: Normocephalic and atraumatic.  Neck: Normal range of motion. Neck supple.  Cardiovascular: Normal rate, regular rhythm and normal heart sounds.   Pulmonary/Chest: Effort normal and breath sounds normal.  Abdominal: Soft. Bowel sounds are normal.  Musculoskeletal: He exhibits no edema or tenderness.  Neurological: He is alert.  Skin: Skin is warm and dry. He is not diaphoretic.  Psychiatric:  He has a normal mood and affect.    Labs reviewed: Basic Metabolic Panel:  Recent Labs  10/28/13 0500  11/06/13 0700  11/09/13 0815 11/11/13 0731 11/12/13 1256 11/13/13 0805 11/16/13 0717  NA 146  < > 142  < > 144 147 144 149* 145  K 4.2  < > 4.5  < > 5.2 5.7* 5.7* 4.7 5.9*  CL 105  < > 100  < > 104 108 105 108 110  CO2 24  < > 31  < > '28 29 27 28 26  ' GLUCOSE 94  < > 95  < > 76 82 93 87 81  BUN 79*  < > 20  < > 43* 47* 48* 44* 44*  CREATININE 3.09*  < > 2.43*  < > 3.63* 3.46* 3.50* 3.41* 2.94*  CALCIUM 8.7  < > 8.4  < > 8.4 8.8 8.5 8.5 8.7  MG 2.1  --   --   --  2.3 2.4  --   --   --   PHOS 4.2  < > 4.0  --  5.3* 4.9*  --   --   --   < > = values in this interval not displayed. Liver Function Tests:  Recent Labs  10/27/13 0500 11/02/13 0503  11/04/13 0640 11/06/13 0700 11/16/13 0717  AST 11 14  --   --   --  17  ALT 9 12  --   --   --  12  ALKPHOS 63 72  --   --   --  68  BILITOT <0.2* <0.2*  --   --   --  <0.2*  PROT 6.1 6.2  --   --   --  6.3  ALBUMIN 2.6* 2.8*  < > 2.6* 2.8* 2.8*  < > = values in this interval not displayed. No results for input(s): LIPASE, AMYLASE in the last 8760 hours. No results for input(s): AMMONIA in the last 8760 hours. CBC:  Recent Labs  11/02/13 0503  11/09/13 0815 11/11/13 0731 11/13/13 0805 11/16/13 0717  WBC 11.4*  < > 6.4 5.9 6.2 4.8  NEUTROABS 9.4*  --  3.9 3.2  --   --   HGB 11.3*  < > 11.8* 11.4* 11.1* 11.0*  HCT 35.5*  < > 37.7* 37.0* 36.4* 35.9*  MCV 91.5  < > 94.5 95.6 96.6 95.5  PLT 194  < > 141* 163 167 183  < > =  values in this interval not displayed. TSH:  Recent Labs  10/27/13 0500 10/28/13 0500  TSH 0.319* 0.252*   A1C: No results found for: HGBA1C Lipid Panel: No results for input(s): CHOL, HDL, LDLCALC, TRIG, CHOLHDL, LDLDIRECT in the last 8760 hours. CBC with Diff    Result: 05/04/2014 5:12 PM   ( Status: F )       WBC 5.1     4.0-10.5 K/uL SLN   RBC 3.33   L 4.22-5.81 MIL/uL SLN    Hemoglobin 8.3   L 13.0-17.0 g/dL SLN   Hematocrit 27.1   L 39.0-52.0 % SLN   MCV 81.4     78.0-100.0 fL SLN   MCH 24.9   L 26.0-34.0 pg SLN   MCHC 30.6     30.0-36.0 g/dL SLN   RDW 16.1   H 11.5-15.5 % SLN   Platelet Count 217     150-400 K/uL SLN   Granulocyte % 39   L 43-77 % SLN   Absolute Gran 2.0     1.7-7.7 K/uL SLN   Lymph % 43     12-46 % SLN   Absolute Lymph 2.2     0.7-4.0 K/uL SLN   Mono % 12     3-12 % SLN   Absolute Mono 0.6     0.1-1.0 K/uL SLN   Eos % 5     0-5 % SLN   Absolute Eos 0.3     0.0-0.7 K/uL SLN   Baso % 1     0-1 % SLN   Absolute Baso 0.1     0.0-0.1 K/uL SLN   Smear Review Criteria for review not met  SLN   Basic Metabolic Panel    Result: 05/04/2014 4:32 PM   ( Status: F )       Sodium 139     135-145 mEq/L SLN   Potassium 5.7   H 3.5-5.3 mEq/L SLN C Chloride 111     96-112 mEq/L SLN   CO2 21     19-32 mEq/L SLN   Glucose 84     70-99 mg/dL SLN   BUN 38   H 6-23 mg/dL SLN   Creatinine 2.53   H 0.50-1.35 mg/dL SLN   Calcium 8.1   L 8.4-10.5 mg/dL SLN   Iron    Result: 05/04/2014 4:32 PM   ( Status: F )       Iron 62     42-165 ug/dL SLN   TIBC    Result: 05/04/2014 4:32 PM   ( Status: F )       UIBC 184     125-400 ug/dL SLN   TIBC 246     215-435 ug/dL SLN   %SAT 25     20-55 % SLN   Ferritin    Result: 05/04/2014 3:38 PM   ( Status: F )       Ferritin 43     22-322 ng/mL SLN   Transferrin    Result: 05/04/2014 7:21 PM   ( Status: F )       Transferrin 197   L 200-360 mg/dL SLN Basic Metabolic Panel    Result: 05/08/2014 4:30 PM   ( Status: F )       Sodium 140     135-145 mEq/L SLN   Potassium 5.3     3.5-5.3 mEq/L SLN   Chloride 112     96-112 mEq/L SLN   CO2 21  19-32 mEq/L SLN   Glucose 103   H 70-99 mg/dL SLN   BUN 41   H 6-23 mg/dL SLN   Creatinine 2.76   H 0.50-1.35 mg/dL SLN   Calcium 7.9   L 8.4-10.5 mg/dL SLN   Assessment/Plan  1. Essential hypertension -blood pressure in appropriate range. Pt currently not on  medications.   2. Chronic congestive heart failure, unspecified congestive heart failure type EF 55-60%; not currently on meds  3. Dementia without behavioral disturbance conts on namenda XR and aricept, without changes to cognitive or functional status  4. Deafness, bilateral unchanged  5. CKD (chronic kidney disease) stage 3, GFR 30-59 ml/min conts on bicarb, will follow up BMP  6. Anemia, iron deficiency Will follow up CBC  7. Bilateral chronic knee pain Will have staff apply biofreeze to bilateral knees QID PRN  8. Dysphagia s/p CVA conts on ASA, cont on dysphagia precautions and thicken liquids

## 2014-09-13 ENCOUNTER — Encounter: Payer: Self-pay | Admitting: Internal Medicine

## 2014-09-13 ENCOUNTER — Non-Acute Institutional Stay (SKILLED_NURSING_FACILITY): Payer: Medicare Other | Admitting: Internal Medicine

## 2014-09-13 DIAGNOSIS — J309 Allergic rhinitis, unspecified: Secondary | ICD-10-CM | POA: Diagnosis not present

## 2014-09-13 DIAGNOSIS — H9193 Unspecified hearing loss, bilateral: Secondary | ICD-10-CM | POA: Diagnosis not present

## 2014-09-13 DIAGNOSIS — Z86718 Personal history of other venous thrombosis and embolism: Secondary | ICD-10-CM

## 2014-09-13 DIAGNOSIS — D509 Iron deficiency anemia, unspecified: Secondary | ICD-10-CM | POA: Diagnosis not present

## 2014-09-13 DIAGNOSIS — I6990 Unspecified sequelae of unspecified cerebrovascular disease: Secondary | ICD-10-CM

## 2014-09-13 DIAGNOSIS — F1010 Alcohol abuse, uncomplicated: Secondary | ICD-10-CM | POA: Diagnosis not present

## 2014-09-13 DIAGNOSIS — F1011 Alcohol abuse, in remission: Secondary | ICD-10-CM

## 2014-09-13 NOTE — Assessment & Plan Note (Signed)
Chronic and stable dysphagia; pt was on plavix , that has been d/c 2/2 fall and pt is on ASA 81 mg ;Plan continue ASA 81 mg

## 2014-09-13 NOTE — Assessment & Plan Note (Signed)
Chronic and stable;Plan: Pt on ASA for prophylaxis

## 2014-09-13 NOTE — Assessment & Plan Note (Signed)
Year round allergies;pt is on zyrtec;Plan - continue zyrtec

## 2014-09-13 NOTE — Progress Notes (Signed)
MRN: [id number] Name: Elijah Ramos  Sex: male Age: 81 y.o. DOB: 12-12-1925  [id number] Facility/Room:129A Level Of Care: SNF Provider: Merrilee Seashore D Emergency Contacts: Extended Emergency Contact Information Primary Emergency Contact: Criger,[name] Address: [address]          [place], [place] [address] [name] Home Phone: [phone] Mobile Phone: [phone] Relation: Son Secondary Emergency Contact: Rohwer,[name]  [place] Mobile Phone: [phone] Relation: Other  Code Status: FULL  Allergies: Aleve; Beta adrenergic blockers; Penicillins; and Sulfa antibiotics  Chief Complaint  Patient presents with  . Medical Management of Chronic Issues    HPI: Patient is 81 y.o. male who is being seen for routine issues.  Past Medical History  Diagnosis Date  . Protein calorie malnutrition   . Dysphagia   . CHF (congestive heart failure)   . CKD (chronic kidney disease)   . Dementia without behavioral disturbance   . Hypertension   . Personal history of DVT (deep vein thrombosis)   . H/O: alcohol abuse   . Stroke   . PNA (pneumonia)     RLL  . H/O acute respiratory failure   . Deafness, bilateral     reads lips  . Hyperkalemia   . Bilateral chronic knee pain   . Late effects of CVA (cerebrovascular accident)     History reviewed. No pertinent past surgical history.    Medication List       This list is accurate as of: 09/13/14  3:37 PM.  Always use your most recent med list.               acidophilus Caps capsule  Take 1 capsule by mouth 2 (two) times daily. For digestive health     aspirin 81 MG tablet  Take 81 mg by mouth daily. For hx cva     atorvastatin 10 MG tablet  Commonly known as:  LIPITOR  Take 5 mg by mouth daily. For CHF     docusate sodium 100 MG capsule  Commonly known as:  COLACE  Take 100 mg by mouth 2 (two) times daily. For constipation     donepezil 10 MG tablet  Commonly known  as:  ARICEPT  Take 10 mg by mouth at bedtime. For dementia     fluticasone 50 MCG/ACT nasal spray  Commonly known as:  FLONASE  Place 2 sprays into both nostrils daily.     folic acid 1 MG tablet  Commonly known as:  FOLVITE  Take 1 mg by mouth daily.     memantine 14 MG Cp24 24 hr capsule  Commonly known as:  NAMENDA XR  Take 14 mg by mouth daily.     omeprazole 40 MG capsule  Commonly known as:  PRILOSEC  Take 40 mg by mouth daily.     oxycodone 5 MG capsule  Commonly known as:  OXY-IR  Take 5 mg by mouth every 6 (six) hours as needed. For moderate to severe pain     pantoprazole 40 MG tablet  Commonly known as:  PROTONIX  Take 40 mg by mouth daily.     polyethylene glycol packet  Commonly known as:  MIRALAX / GLYCOLAX  Take 17 g by mouth daily. For bowel health     sodium bicarbonate 650 MG tablet  Take 650 mg by mouth 2 (two) times daily. For chronic kidney disease     thiamine 100 MG tablet  Take 100 mg by mouth daily.  For supplement     ZYRTEC ALLERGY 10 MG tablet  Generic drug:  cetirizine  Take 10 mg by mouth at bedtime. For allergies        No orders of the defined types were placed in this encounter.     There is no immunization history on file for this patient.  History  Substance Use Topics  . Smoking status: Unknown If Ever Smoked  . Smokeless tobacco: Not on file  . Alcohol Use: Yes     Comment: former abuse    Review of Systems  DATA OBTAINED: from patient; denies any problem;not reliable GENERAL:  no fevers, fatigue, appetite changes SKIN: No itching, rash HEENT: No complaint RESPIRATORY: No cough, wheezing, SOB CARDIAC: No chest pain, palpitations, lower extremity edema  GI: No abdominal pain, No N/V/D or constipation, No heartburn or reflux  GU: No dysuria, frequency or urgency, or incontinence  MUSCULOSKELETAL: No unrelieved bone/joint pain NEUROLOGIC: No headache, dizziness  PSYCHIATRIC: No overt anxiety or sadness  Filed  Vitals:   09/13/14 1521  BP: 123/70  Pulse: 52  Temp: 97.5 F (36.4 C)  Resp: 16    Physical Exam  GENERAL APPEARANCE: Alert, nonconversant,WM No acute distress  SKIN: No diaphoresis rash HEENT: Unremarkable RESPIRATORY: Breathing is even, unlabored. Lung sounds are clear   CARDIOVASCULAR: Heart RRR no murmurs, rubs or gallops. No peripheral edema  GASTROINTESTINAL: Abdomen is soft, non-tender, not distended w/ normal bowel sounds.  GENITOURINARY: Bladder non tender, not distended  MUSCULOSKELETAL: No abnormal joints or musculature NEUROLOGIC: Cranial nerves 2-12 grossly intact; pt is seaf PSYCHIATRIC: Mood and affect appropriate to situation, no behavioral issues  Patient Active Problem List   Diagnosis Date Noted  . Dysphagia S/P CVA (cerebrovascular accident) 04/29/2014  . Anemia, iron deficiency 04/29/2014  . Allergic rhinitis 01/29/2014  . Constipation 01/29/2014  . Late effects of CVA (cerebrovascular accident)   . Protein calorie malnutrition   . Dysphagia   . Chronic CHF (congestive heart failure)   . CKD (chronic kidney disease) stage 3, GFR 30-59 ml/min   . Dementia without behavioral disturbance   . Hypertension   . Personal history of DVT (deep vein thrombosis)   . H/O: alcohol abuse   . PNA (pneumonia)   . H/O acute respiratory failure   . Deafness, bilateral   . Bilateral chronic knee pain     CBC    Component Value Date/Time   WBC 4.8 11/16/2013 0717   RBC 3.76* 11/16/2013 0717   HGB 11.0* 11/16/2013 0717   HCT 35.9* 11/16/2013 0717   PLT 183 11/16/2013 0717   MCV 95.5 11/16/2013 0717   LYMPHSABS 1.8 11/11/2013 0731   MONOABS 0.7 11/11/2013 0731   EOSABS 0.1 11/11/2013 0731   BASOSABS 0.0 11/11/2013 0731    CMP     Component Value Date/Time   NA 145 11/16/2013 0717   K 5.9* 11/16/2013 0717   CL 110 11/16/2013 0717   CO2 26 11/16/2013 0717   GLUCOSE 81 11/16/2013 0717   BUN 44* 11/16/2013 0717   CREATININE 2.94* 11/16/2013 0717    CALCIUM 8.7 11/16/2013 0717   PROT 6.3 11/16/2013 0717   ALBUMIN 2.8* 11/16/2013 0717   AST 17 11/16/2013 0717   ALT 12 11/16/2013 0717   ALKPHOS 68 11/16/2013 0717   BILITOT <0.2* 11/16/2013 0717   GFRNONAA 18* 11/16/2013 0717   GFRAA 21* 11/16/2013 0717    Assessment and Plan  Personal history of DVT (deep vein thrombosis) Chronic and stable;Plan:  Pt on ASA for prophylaxis   Late effects of CVA (cerebrovascular accident) Chronic and stable dysphagia; pt was on plavix , that has been d/c 2/2 fall and pt is on ASA 81 mg ;Plan continue ASA 81 mg   Deafness, bilateral Chronic and stable, cause unknown;pt reads lips; Plan -continue to monitor   Allergic rhinitis Year round allergies;pt is on zyrtec;Plan - continue zyrtec   H/O: alcohol abuse Pt was put on thiamine and folate 2/2 ETOH abuse and he has been onit for a while;Plan check  folate levels   Anemia, iron deficiency Pt has had a dramatic improvement if Hb with addition of daily iron; 8.3 in 04/2014 to 11.4 in 08/2014; am checking B12 and folate levels also     Margit Hanks, MD

## 2014-09-13 NOTE — Assessment & Plan Note (Signed)
Pt has had a dramatic improvement if Hb with addition of daily iron; 8.3 in 04/2014 to 11.4 in 08/2014; am checking B12 and folate levels also

## 2014-09-13 NOTE — Assessment & Plan Note (Signed)
Chronic and stable, cause unknown;pt reads lips; Plan -continue to monitor

## 2014-09-13 NOTE — Assessment & Plan Note (Addendum)
Pt was put on thiamine and folate 2/2 ETOH abuse and he has been onit for a while;Plan check  folate levels

## 2014-10-12 ENCOUNTER — Non-Acute Institutional Stay (SKILLED_NURSING_FACILITY): Payer: Medicare Other | Admitting: Nurse Practitioner

## 2014-10-12 DIAGNOSIS — I509 Heart failure, unspecified: Secondary | ICD-10-CM | POA: Diagnosis not present

## 2014-10-12 DIAGNOSIS — K5909 Other constipation: Secondary | ICD-10-CM | POA: Diagnosis not present

## 2014-10-12 DIAGNOSIS — N183 Chronic kidney disease, stage 3 unspecified: Secondary | ICD-10-CM

## 2014-10-12 DIAGNOSIS — F0390 Unspecified dementia without behavioral disturbance: Secondary | ICD-10-CM | POA: Diagnosis not present

## 2014-10-12 DIAGNOSIS — G8929 Other chronic pain: Secondary | ICD-10-CM

## 2014-10-12 DIAGNOSIS — I10 Essential (primary) hypertension: Secondary | ICD-10-CM

## 2014-10-12 DIAGNOSIS — M25562 Pain in left knee: Secondary | ICD-10-CM

## 2014-10-12 DIAGNOSIS — M25561 Pain in right knee: Secondary | ICD-10-CM | POA: Diagnosis not present

## 2014-10-12 NOTE — Progress Notes (Signed)
Patient ID: Elijah Ramos, male   DOB: 12/05/1925, 81 y.o.   MRN: [id number]    Nursing Home Location:  [place] of Service: SNF (31)  PCP: No primary care provider on file.  Allergies  Allergen Reactions  . Aleve [Naproxen Sodium]   . Beta Adrenergic Blockers   . Penicillins   . Sulfa Antibiotics     Chief Complaint  Patient presents with  . Medical Management of Chronic Issues    HPI:  Patient is a 81 y.o. male seen today at [facility] for routine follow up on chronic conditions. Pt with a pmh of CKD, deafness, dementia, dysphagia, CVA, hx of DVT. Pt is very HOH making ROS difficult to obtain. Pt working with restorative to walk. Restorative reports pt with complaints of knee pain when walking. Otherwise pt has been stable in the last month. Without acute issues and no excerebration of chronic conditions.     Review of Systems:  Review of Systems  Unable to perform ROS pt is severely HOH unable to perform ROS  Past Medical History  Diagnosis Date  . Protein calorie malnutrition   . Dysphagia   . CHF (congestive heart failure)   . CKD (chronic kidney disease)   . Dementia without behavioral disturbance   . Hypertension   . Personal history of DVT (deep vein thrombosis)   . H/O: alcohol abuse   . Stroke   . PNA (pneumonia)     RLL  . H/O acute respiratory failure   . Deafness, bilateral     reads lips  . Hyperkalemia   . Bilateral chronic knee pain   . Late effects of CVA (cerebrovascular accident)    No past surgical history on file. Social History:   reports that he drinks alcohol. His tobacco and drug histories are not on file.  No family history on file.  Medications: Patient's Medications  New Prescriptions   No medications on file  Previous Medications   ACIDOPHILUS (RISAQUAD) CAPS CAPSULE    Take 1 capsule by mouth 2 (two) times daily. For digestive health   ASPIRIN 81 MG TABLET    Take 81 mg by mouth  daily. For hx cva   ATORVASTATIN (LIPITOR) 10 MG TABLET    Take 5 mg by mouth daily. For CHF   CETIRIZINE (ZYRTEC ALLERGY) 10 MG TABLET    Take 10 mg by mouth at bedtime. For allergies   DOCUSATE SODIUM (COLACE) 100 MG CAPSULE    Take 100 mg by mouth 2 (two) times daily. For constipation   DONEPEZIL (ARICEPT) 10 MG TABLET    Take 10 mg by mouth at bedtime. For dementia   FLUTICASONE (FLONASE) 50 MCG/ACT NASAL SPRAY    Place 2 sprays into both nostrils daily.    FOLIC ACID (FOLVITE) 1 MG TABLET    Take 1 mg by mouth daily.   MEMANTINE (NAMENDA XR) 14 MG CP24 24 HR CAPSULE    Take 14 mg by mouth daily.   OMEPRAZOLE (PRILOSEC) 40 MG CAPSULE    Take 40 mg by mouth daily.   OXYCODONE (OXY-IR) 5 MG CAPSULE    Take 5 mg by mouth every 6 (six) hours as needed. For moderate to severe pain   PANTOPRAZOLE (PROTONIX) 40 MG TABLET    Take 40 mg by mouth daily.   POLYETHYLENE GLYCOL (MIRALAX / GLYCOLAX) PACKET    Take 17 g by mouth daily. For bowel health  SODIUM BICARBONATE 650 MG TABLET    Take 650 mg by mouth 2 (two) times daily. For chronic kidney disease   THIAMINE 100 MG TABLET    Take 100 mg by mouth daily. For supplement  Modified Medications   No medications on file  Discontinued Medications   No medications on file     Physical Exam: Filed Vitals:   10/12/14 1456  BP: 155/66  Pulse: 60  Temp: 97.8 F (36.6 C)  Resp: 18  Weight: 162 lb (73.483 kg)    Physical Exam  Constitutional: He appears well-developed and well-nourished. No distress.  HENT:  Head: Normocephalic and atraumatic.  Neck: Normal range of motion. Neck supple.  Cardiovascular: Normal rate, regular rhythm and normal heart sounds.   Pulmonary/Chest: Effort normal and breath sounds normal.  Abdominal: Soft. Bowel sounds are normal.  Musculoskeletal: He exhibits no edema or tenderness.  Neurological: He is alert.  Skin: Skin is warm and dry. He is not diaphoretic.  Psychiatric: He has a normal mood and affect.     Labs reviewed: Basic Metabolic Panel:  Recent Labs  10/28/13 0500  11/06/13 0700  11/09/13 0815 11/11/13 0731 11/12/13 1256 11/13/13 0805 11/16/13 0717  NA 146  < > 142  < > 144 147 144 149* 145  K 4.2  < > 4.5  < > 5.2 5.7* 5.7* 4.7 5.9*  CL 105  < > 100  < > 104 108 105 108 110  CO2 24  < > 31  < > '28 29 27 28 26  ' GLUCOSE 94  < > 95  < > 76 82 93 87 81  BUN 79*  < > 20  < > 43* 47* 48* 44* 44*  CREATININE 3.09*  < > 2.43*  < > 3.63* 3.46* 3.50* 3.41* 2.94*  CALCIUM 8.7  < > 8.4  < > 8.4 8.8 8.5 8.5 8.7  MG 2.1  --   --   --  2.3 2.4  --   --   --   PHOS 4.2  < > 4.0  --  5.3* 4.9*  --   --   --   < > = values in this interval not displayed. Liver Function Tests:  Recent Labs  10/27/13 0500 11/02/13 0503  11/04/13 0640 11/06/13 0700 11/16/13 0717  AST 11 14  --   --   --  17  ALT 9 12  --   --   --  12  ALKPHOS 63 72  --   --   --  68  BILITOT <0.2* <0.2*  --   --   --  <0.2*  PROT 6.1 6.2  --   --   --  6.3  ALBUMIN 2.6* 2.8*  < > 2.6* 2.8* 2.8*  < > = values in this interval not displayed. No results for input(s): LIPASE, AMYLASE in the last 8760 hours. No results for input(s): AMMONIA in the last 8760 hours. CBC:  Recent Labs  11/02/13 0503  11/09/13 0815 11/11/13 0731 11/13/13 0805 11/16/13 0717  WBC 11.4*  < > 6.4 5.9 6.2 4.8  NEUTROABS 9.4*  --  3.9 3.2  --   --   HGB 11.3*  < > 11.8* 11.4* 11.1* 11.0*  HCT 35.5*  < > 37.7* 37.0* 36.4* 35.9*  MCV 91.5  < > 94.5 95.6 96.6 95.5  PLT 194  < > 141* 163 167 183  < > = values in this interval not displayed. TSH:  Recent Labs  10/27/13 0500 10/28/13 0500  TSH 0.319* 0.252*   A1C: No results found for: HGBA1C Lipid Panel: No results for input(s): CHOL, HDL, LDLCALC, TRIG, CHOLHDL, LDLDIRECT in the last 8760 hours. CBC with Diff    Result: 05/04/2014 5:12 PM   ( Status: F )       WBC 5.1     4.0-10.5 K/uL SLN   RBC 3.33   L 4.22-5.81 MIL/uL SLN   Hemoglobin 8.3   L 13.0-17.0 g/dL SLN    Hematocrit 27.1   L 39.0-52.0 % SLN   MCV 81.4     78.0-100.0 fL SLN   MCH 24.9   L 26.0-34.0 pg SLN   MCHC 30.6     30.0-36.0 g/dL SLN   RDW 16.1   H 11.5-15.5 % SLN   Platelet Count 217     150-400 K/uL SLN   Granulocyte % 39   L 43-77 % SLN   Absolute Gran 2.0     1.7-7.7 K/uL SLN   Lymph % 43     12-46 % SLN   Absolute Lymph 2.2     0.7-4.0 K/uL SLN   Mono % 12     3-12 % SLN   Absolute Mono 0.6     0.1-1.0 K/uL SLN   Eos % 5     0-5 % SLN   Absolute Eos 0.3     0.0-0.7 K/uL SLN   Baso % 1     0-1 % SLN   Absolute Baso 0.1     0.0-0.1 K/uL SLN   Smear Review Criteria for review not met  SLN   Basic Metabolic Panel    Result: 05/04/2014 4:32 PM   ( Status: F )       Sodium 139     135-145 mEq/L SLN   Potassium 5.7   H 3.5-5.3 mEq/L SLN C Chloride 111     96-112 mEq/L SLN   CO2 21     19-32 mEq/L SLN   Glucose 84     70-99 mg/dL SLN   BUN 38   H 6-23 mg/dL SLN   Creatinine 2.53   H 0.50-1.35 mg/dL SLN   Calcium 8.1   L 8.4-10.5 mg/dL SLN   Iron    Result: 05/04/2014 4:32 PM   ( Status: F )       Iron 62     42-165 ug/dL SLN   TIBC    Result: 05/04/2014 4:32 PM   ( Status: F )       UIBC 184     125-400 ug/dL SLN   TIBC 246     215-435 ug/dL SLN   %SAT 25     20-55 % SLN   Ferritin    Result: 05/04/2014 3:38 PM   ( Status: F )       Ferritin 43     22-322 ng/mL SLN   Transferrin    Result: 05/04/2014 7:21 PM   ( Status: F )       Transferrin 197   L 200-360 mg/dL SLN Basic Metabolic Panel    Result: 05/08/2014 4:30 PM   ( Status: F )       Sodium 140     135-145 mEq/L SLN   Potassium 5.3     3.5-5.3 mEq/L SLN   Chloride 112     96-112 mEq/L SLN   CO2 21     19-32 mEq/L SLN   Glucose 103  H 70-99 mg/dL SLN   BUN 41   H 6-23 mg/dL SLN   Creatinine 2.76   H 0.50-1.35 mg/dL SLN   Calcium 7.9   L 8.4-10.5 mg/dL SLN  CBC with Diff    Result: 08/30/2014 12:06 PM   ( Status: F )     C WBC 9.8     4.0-10.5 K/uL SLN   RBC 3.86   L 4.22-5.81 MIL/uL SLN    Hemoglobin 11.4   L 13.0-17.0 g/dL SLN   Hematocrit 35.1   L 39.0-52.0 % SLN   MCV 90.9     78.0-100.0 fL SLN   MCH 29.5     26.0-34.0 pg SLN   MCHC 32.5     30.0-36.0 g/dL SLN   RDW 15.9   H 11.5-15.5 % SLN   Platelet Count 168     150-400 K/uL SLN   MPV 10.9     8.6-12.4 fL SLN   Granulocyte % 79   H 43-77 % SLN   Absolute Gran 7.7     1.7-7.7 K/uL SLN   Lymph % 11   L 12-46 % SLN   Absolute Lymph 1.1     0.7-4.0 K/uL SLN   Mono % 9     3-12 % SLN   Absolute Mono 0.9     0.1-1.0 K/uL SLN   Eos % 1     0-5 % SLN   Absolute Eos 0.1     0.0-0.7 K/uL SLN   Baso % 0     0-1 % SLN   Absolute Baso 0.0     0.0-0.1 K/uL SLN   Smear Review Criteria for review not met Assessment/Plan  1. Chronic congestive heart failure, unspecified congestive heart failure type [facility] 55-60%, no record of last echo, not on medications, no current symptoms -will follow up echo at this time.   2. Essential hypertension Elevated today, otherwise has been at goal, will not start medication at this time, cont to monitor and start medication if blood pressure trends upward.   3. Other constipation -controlled on current regimen   4. CKD (chronic kidney disease) stage 3, GFR 30-59 ml/min BUN/Cr stable in October 2015, cont on sodium bicarb, will follow up BMP next month  5. Dementia without behavioral disturbance Unchanged, pt does well in current environment, no changes to cognitive or functional status  6. Bilateral chronic knee pain Stable, conts on biofreeze and PRN oxy.

## 2014-11-05 NOTE — H&P (Signed)
PATIENT NAME:  Elijah Ramos, Elijah Ramos MR#:  [id number] DATE OF BIRTH:  Oct 17, 1925  DATE OF ADMISSION:  05/30/2013  PRIMARY CARE PHYSICIAN:  Dr. Jimmye Norman at [place]: Altered mental status.   HISTORY OF PRESENT ILLNESS: An 81 year old male with mild dementia, history of alcoholism, history of bradycardia and hypertension, who presents with above complaint. I actually called Ivin Booty, who is the coordinator at [facility], the facility which the patient lives at. According to Ivin Booty, the patient has mild to moderate dementia. He is aware of all of his surroundings, performs all his ADLs, does walk. This morning, however, he was not very responsive to the staff, and was lying in bed. They were having difficulty arousing him, which is unusual for him. She is not sure how he was yesterday prior to going to bed, so they called the ER for further evaluation. When he got to the ER, a CT of the head was performed, which showed a subacute versus acute left parietooccipital lobe CVA. The patient apparently is hard of hearing and he does not have his hearing aids on, and I am unable to obtain any sort of information from him. I do not know if it is from his stroke, or if it is from lack of hearing aids. He is able to follow some commands when I show him what to do; however, he is not able to tell me any history.   REVIEW OF SYSTEMS:  Unable to obtain from the patient, as stated above.   PAST MEDICAL HISTORY AS PER MEDICAL CHART: 1.  Shortness of breath.  2.  COPD.  3.  Bradycardia.  4.  Hypertension.  5.  Dementia.  6.  History of renal failure.  7.  History of alcoholism.  8.  History of DVT.   9.  Chronic knee pain.   MEDICATIONS: 1.  Norvasc 5 mg daily.  2.  Namenda 5 mg daily.  3.  Omeprazole 20 mg b.i.d.  4.  Sodium bicarb 650 b.i.d.  5.  Cetirizine 10 mg at bedtime.  6.  Donepezil 10 mg at bedtime.  7.  Flonase nasal spray 2 sprays in each nostril daily.  8.  Advair 250/50 b.i.d.   9.  Tylenol 650 mg q. 8 hours p.r.n.  10.  Remeron 7.5 mg at bedtime.  11.  Pravastatin 10 mg daily.   SOCIAL HISTORY: According to Ivin Booty, no tobacco use. He is a former alcoholic.   FAMILY HISTORY: Unknown.   ALLERGIES ACCORDING TO MEDICAL CHART: BETA BLOCKERS, NSAIDS, PENICILLIN, and SULFA.    SURGICAL HISTORY: Unknown.   PHYSICAL EXAMINATION:  VITAL SIGNS: Temperature 97.6, pulse 49, respirations 18, blood pressure 101/60, 99% on room air.  GENERAL: The patient's eyes are open. His pupils are about 1 mm bilaterally and symmetrically, but he does not seem to be in any kind of distress.  HEENT: Head is atraumatic. Pupils are 1 mm, as mentioned. Sclerae anicteric. Mucous membranes are dry. Oropharynx is clear.   CARDIOVASCULAR:  The patient is bradycardic without any murmurs, gallops, or rubs. He has distant heart sounds. PMI is hard to palpate.  LUNGS: Clear to auscultation without crackles, rales, rhonchi, or wheezing. Good respiratory effort. No egophony or tactile fremitus.  GASTROINTESTINAL: No tenderness or mass. No hepatosplenomegaly. No rebound, guarding, or ecchymosis. Abdominal sound are equal in all 4 quadrants. The abdomen is nondistended.  MUSCULOSKELETAL: No pathology to digits or nails. Extremities move x 4. No tenderness or effusion. Range of motion  is adequate. Strength and tone are also equal.   NEUROLOGIC:  Hard to do a neuro assessment, as the patient follows some commands, like he can elevate both of his arms and legs, and touch his nose, but that is with me demonstrating what to do. It appears that he has a facial droop, which Ivin Booty from the home care is says is probably new, and he seems to be aphasic but, once again, the patient is hard of hearing, does not have his hearing aids, so it is not clear how much is just a communication issue or from his stroke.   LABORATORIES: pH 7.35, pCO2 of 44, pO2 of 70 FiO2 of 26%. White blood cells 5, hemoglobin 10.5, hematocrit 33,  platelets 183. Sodium 145, potassium 4.2, chloride 115, bicarbonate 28, BUN 35, creatinine 2.53, glucose 82, calcium 8.2. Bilirubin 0.2, alk phos 79, ALT 21, AST 19, total protein 6.2, albumin 2.8. Troponin less than 0.02.   CT of the head without contrast shows relative low attenuation in the medial aspect of the left parietooccipital lobe, concerning for acute versus subacute infarct.  Chest x-ray shows interval development of a small right-sided pleural effusion, with associated mild worsening of right basilar opacities, atelectasis versus infiltrate.  EKG shows wide QRS with PVCs and bifascicular block.   ASSESSMENT AND PLAN: An 81 year old male with dementia, chronic renal failure, presented with altered mental status, found to have a cerebrovascular accident.  1.  Altered mental status with metabolic encephalopathy, likely secondary to CVA. Chest x-ray is also concerning for possible pneumonia.   2. Acute CVA, as seen on CT scan of the head. Will perform carotid Doppler and echocardiogram. Start aspirin. It is noted that patient has an allergy to NSAIDs. Aspirin was started in the ER, and will monitor closely, but unsure what the allergy is. We will also continue pravastatin. I will order neuro checks q. 4 hours. Occupational therapy, physical therapy, and speech consult has been placed, as well as clinical Education officer, museum.   3.  Pneumonia. The patient has an x-ray which might be concerning for a pneumonia. He did come in with altered mental status. He could possibly be having pneumonia as well as contributing to his mental status changes. I started Levaquin and ordered blood cultures. He has a small right pleural effusion, which will need followup x-ray in 4 to 6 weeks.   4.  Dementia, as per the caretaker. The patient is pretty functional, performs his own activities of daily living, ambulates. He will be restarted on his outpatient medications, and will monitor closely.   5.  History of  chronic obstructive pulmonary disease. Will continue his inhalers.    6.  Hyperlipidemia. Continue pravastatin.  7. Hypertension. Will hold Norvasc for now to allow permissive hypertension due to the acute stroke.   8.  Chronic renal failure. It is unclear to me if this is acute on chronic. I do not know what his baseline is. I do not see any creatinine in the chart, so we will continue to monitor. Hold any nephrotoxic agents and continue his sodium bicarb.   The patient is a FULL CODE STATUS.  TIME SPENT: Approximately 60 minutes.    ____________________________ Donell Beers. Benjie Karvonen, MD spm:mr D: 05/30/2013 14:48:18 ET T: 05/30/2013 18:21:17 ET JOB#: [id number]  cc: Jie Stickels P. Benjie Karvonen, MD, <Dictator> DR. Jimmye Norman, [facility] Estellar Cadena P Lourine Alberico MD ELECTRONICALLY SIGNED 05/30/2013 20:29

## 2014-11-05 NOTE — Discharge Summary (Signed)
PATIENT NAME:  Elijah Ramos, [name] MR#:  [id number] DATE OF BIRTH:  07/14/1926  DATE OF ADMISSION:  05/30/2013 DATE OF DISCHARGE:  06/01/2013  PRESENTING COMPLAINT: Altered mental status.   DISCHARGE DIAGNOSES:  1.  Acute left parietal occipital cerebrovascular accident.  2.  Hypertension.  3.  Impaired hearing.  4.  Dementia.  5.  Chronic kidney disease, stage III/IV.  CONDITION ON DISCHARGE: Fair.   CODE STATUS: Full code.   MEDICATIONS:  1.  Aspirin 81 mg p.o. daily.  2.  Remeron 15 mg at bedtime.  3.  Flonase 50 mcg per inhalation 2 sprays each nostril once a day.  4.  Advair 250/50 mg 1 puff b.i.d.  5.  Combivent Respimat 1 puff every 6 hours as needed. This is 100 mcg/20 mcg per inhalation. 6.  Tylenol 650 mg 1 tablet every 8 hours as needed.  7.  Pravastatin 10 mg at bedtime.  8.  Donepezil 10 mg 1 tablet once a day.  9.  Sodium bicarbonate 650 mg 1 tablet b.i.d.  10. Omeprazole 20 mg p.o. b.i.d.  11. Namenda 5 mg p.o. daily.  12. Amlodipine 5 mg p.o. daily.   Physical therapy has been set up.   DIET: Low sodium, low fat, cholesterol diet.   FOLLOWUP: [facility].   CONSULTATIONS: PT, OT.   LABS: Echo Doppler showed ejection fraction of 55% to 60%, normal global left ventricular systolic function, impaired relaxation pattern of LV diastolic filling, moderate to severe aortic valve stenosis, mean gradient was 30 mmHg with a valve area of 0.085 and mild tricuspid regurgitation. Glucose is 158, BUN is 31, creatinine is 2.34, sodium 142, potassium 4.3, chloride 113, bicarbonate is 22, calcium is 8.1. Ultrasound carotid Doppler showed minor plaque formation, no hemodynamically significant ICA stenosis. White count is 6.1, hemoglobin and hematocrit is 10.7 and 33.3, platelet count is 171. Lipid profile within normal limits. TSH is 0.368. UA negative for UTI. Urine drug screen positive for benzodiazepines. Blood cultures negative in 48  hours. Magnesium 1.8.   HISTORY OF PRESENT ILLNESS: Elijah Ramos is a very pleasant 81 year old gentleman, who comes in from [facility] with altered mental status. He was admitted with:  1.  Altered mental status with metabolic encephalopathy, likely secondary to CVA. The patient was admitted on the telemetry floor where he remained in sinus rhythm. His mentation improved and was at baseline prior to discharge.  2.  Acute left parieto-occipital CVA. The patient was started on aspirin since he was on none at home. He was continued on his pravastatin. Lipid profile remained normal. Echocardiogram was done and results as above were noted. Carotid Doppler was negative for any significant ICA stenosis. PT, OT recommended home health PT, which has been set up. The patient was tolerating a soft diet.  3.  Abnormal chest x-ray, which appears to be due to chronic changes in the right lower lobe due to hiatal hernia. Clinically no signs or symptoms of pneumonia. Antibiotics were discontinued.  4.  Dementia. The patient is functional and performs his own activities of daily living and ambulates well. Per care taker and [facility], the patient did come back to his baseline prior to discharge.  5.  History of COPD. Continued inhalers.  6.  Hyperlipidemia. Continued pravastatin.  7.  Hypertension. Resumed Norvasc.  8.  CKD, stage III/IV. Received IV fluids. Clinically euvolemic. Creatinine is stable.   The patient  was discharged back to family group home in stable condition.   CODE STATUS: He remained a full code.   TIME SPENT: 40 minutes.  ____________________________ Wylie Hail Allena Katz, MD sap:aw D: 06/02/2013 07:18:43 ET T: 06/02/2013 07:32:15 ET JOB#: [id number]  cc: Marshal Eskew A. Allena Katz, MD, <Dictator> Lucillie Garfinkel. Mayford Knife, MD Willow Ora MD ELECTRONICALLY SIGNED 06/08/2013 14:14

## 2014-11-06 NOTE — Discharge Summary (Signed)
Dates of Admission and Diagnosis:  Date of Admission 21-Oct-2013   Date of Discharge 26-Oct-2013   Admitting Diagnosis Pneumonia   Final Diagnosis Acute on chronic diastolic chf Pneumonia Acute resp failure Acute encephalopathy Sepsis CKD4 HTN    Chief Complaint/History of Present Illness CHIEF COMPLAINT: Confusion and low oxygen levels.   HISTORY OF PRESENTING ILLNESS: An 81 year old Caucasian male patient with history of CVA, dementia, COPD, hypertension, bradycardia, prior alcoholism, presents to the hospital with complaints of altered mental status in from group home. The patient was also found to have sats of 86% on room air. His chest x-ray suggests possible right lower lobe pneumonia, small pleural effusion, and is being admitted to the hospitalist service.   The patient's blood pressure initially was extremely low at 80/48, had a right IJ central line placed, has received a liter bolus of normal saline, and his blood pressure presently is 91/53. He is on 2 liters oxygen and saturations are 92% to 93%.   His urinalysis shows 3 WBCs and trace bacteria.   The patient is unable to contribute to history.  He is deaf, has significant dementia, and is trying to talk, only 1 to 2 words which are very slurred.   Allergies:  Beta Blockers: Bradycardia  PCN: Unknown  Sulfa drugs: Unknown  NSAIDS: Unknown  Hepatic:  09-Apr-15 04:12   Bilirubin, Total 0.3  Alkaline Phosphatase 66 (45-117 NOTE: New Reference Range 06/05/13)  SGPT (ALT) 16  SGOT (AST)  14  Total Protein, Serum  5.7  Albumin, Serum  2.5  Lab:  09-Apr-15 05:24   pH (ABG)  7.37  PCO2  32  PO2  43  FiO2 100  Base Excess  -5.8  HCO3  18.5  O2 Saturation  78.9  O2 Device NON RE-BRE  Specimen Site (ABG) RT RADIAL  Specimen Type (ABG) ARTERIAL  Patient Temp (ABG) 37.0    09:12   pH (ABG)  7.35  PCO2  29  PO2 97  FiO2 100  Base Excess  -8.2  HCO3  16.0  O2 Saturation 97.8  O2 Device BIPAP  Specimen  Site (ABG) RT RADIAL  Specimen Type (ABG) ARTERIAL  Patient Temp (ABG) 37.0 (Result(s) reported on 22 Oct 2013 at 09:19AM.)  Routine Chem:  09-Apr-15 04:12   Glucose, Serum 80  BUN  32  Creatinine (comp)  2.30  Sodium, Serum 142  Potassium, Serum 4.5  Chloride, Serum  114  CO2, Serum 22  Calcium (Total), Serum  7.6  Anion Gap  6  Osmolality (calc) 289  eGFR (African American)  29  eGFR (Non-African American)  25 (eGFR values <15m/min/1.73 m2 may be an indication of chronic kidney disease (CKD). Calculated eGFR is useful in patients with stable renal function. The eGFR calculation will not be reliable in acutely ill patients when serum creatinine is changing rapidly. It is not useful in  patients on dialysis. The eGFR calculation may not be applicable to patients at the low and high extremes of body sizes, pregnant women, and vegetarians.)  B-Type Natriuretic Peptide ([facility]  3199 (Result(s) reported on 22 Oct 2013 at 09:42AM.)  Magnesium, Serum 1.8 (1.8-2.4 THERAPEUTIC RANGE: 4-7 mg/dL TOXIC: > 10 mg/dL  -----------------------)    05:24   Result Comment - HAND DELIVERED  - NOTIFIED OF CRITICAL VALUE  - Dr.Gouru 0528 [facility]/22/13 [facility] Result(s) reported on 22 Oct 2013 at 05:30AM.  11-Apr-15 08:41   Result Comment LABS - This specimen was collected through an   -  indwelling catheter or arterial line.  - A minimum of 14ms of blood was wasted prior    - to collecting the sample.  Interpret  - results with caution.  Result(s) reported on 24 Oct 2013 at 09:04AM.  Glucose, Serum 95  BUN  41  Creatinine (comp)  2.34  Sodium, Serum  147  Potassium, Serum 4.4  Chloride, Serum  117  CO2, Serum 24  Calcium (Total), Serum 8.5  Anion Gap  6  Osmolality (calc) 302  eGFR (African American)  28  eGFR (Non-African American)  24 (eGFR values <674mmin/1.73 m2 may be an indication of chronic kidney disease (CKD). Calculated eGFR is useful in patients with stable renal function. The eGFR  calculation will not be reliable in acutely ill patients when serum creatinine is changing rapidly. It is not useful in  patients on dialysis. The eGFR calculation may not be applicable to patients at the low and high extremes of body sizes, pregnant women, and vegetarians.)  Routine Hem:  09-Apr-15 04:12   Platelet Count (CBC)  106  WBC (CBC) 5.8  RBC (CBC)  3.59  Hemoglobin (CBC)  10.6  Hematocrit (CBC)  33.0  MCV 92  MCH 29.6  MCHC 32.2  RDW  20.3  Neutrophil % 69.4  Lymphocyte % 19.6  Monocyte % 10.4  Eosinophil % 0.1  Basophil % 0.5  Neutrophil # 4.0  Lymphocyte # 1.1  Monocyte # 0.6  Eosinophil # 0.0  Basophil # 0.0 (Result(s) reported on 22 Oct 2013 at 04:48AM.)  11-Apr-15 08:41   WBC (CBC) 6.0 (Result(s) reported on 24 Oct 2013 at 09:03AM.)   PERTINENT RADIOLOGY STUDIES: XRay:    08-Apr-15 15:35, Chest Portable Single View  Chest Portable Single View   REASON FOR EXAM:    Sepsis  COMMENTS:       PROCEDURE: DXR - DXR PORTABLE CHEST SINGLE VIEW  - Oct 21 2013  3:35PM     CLINICAL DATA:  Sepsis, hypotension.    EXAM:  PORTABLE CHEST - 1 VIEW    COMPARISON:  DG CHEST 1V PORT dated 05/30/2013; CT CHEST W/O CM  dated 08/30/2009; DG CHEST 1V PORT dated 08/29/2009    FINDINGS:  Trachea is midline. Heart size stable. Stable pleural parenchymal  opacity at the base the right hemi thorax. Hiatal hernia is better  seen on 08/30/2009. Calcified pleural plaques bilaterally. No  pleural fluid.     IMPRESSION:  1. No acute findings.  2. Asbestos related pleural disease.  3. Favor pleural parenchymal scarring at the base of the right hemi  thorax, stable.      Electronically Signed    By: MeLorin Picket.D.    On: 10/21/2013 15:43     Verified By: MELuretha RuedM.D.,    08-Apr-15 16:15, Chest 1 View AP or PA  Chest 1 View AP or PA   REASON FOR EXAM:    central line placement  COMMENTS:       PROCEDURE: DXR - DXR CHEST 1 VIEWAP OR PA  - Oct 21 2013   4:15PM     CLINICAL DATA:  Status post central line placement ; history of COPD  and cardiac dysrhythmia ; the patient was unable toremain  motionless for this study.    EXAM:  CHEST - 1 VIEW    COMPARISON:  DG CHEST 1V PORT dated 10/21/2013; CT CHEST W/O CM dated  08/30/2009  FINDINGS:  The patient has undergone placement of a right  internal jugular  venous catheter. The tip of thecatheter lies in the region of the  proximal SVC. There is no evidence of a postprocedure pneumothorax  or hemothorax. There is persistent blunting of the right lateral  costophrenic angle with obscuration of much of the hemidiaphragm  consistent with a pleural effusion. Right basilar atelectasis is  suspected and stable. The left lung is well-expanded and clear.  Patchy faintly calcific densities bilaterally are consistent with  known pleural calcifications.     IMPRESSION:  There is no evidencepostprocedure complication following placement  of a right internal jugular venous catheter. There are chronic  findings at the right lung base suggesting atelectasis, infiltrate,  and/or small right pleural effusion.      Electronically Signed    By: David  Martinique    On: 10/21/2013 16:18         Verified By: DAVID A. Martinique, M.D., MD  CT:    08-Apr-15 16:37, CT Head Without Contrast  CT Head Without Contrast   REASON FOR EXAM:    altered mental status  COMMENTS:       PROCEDURE: CT  - CT HEAD WITHOUT CONTRAST  - Oct 21 2013  4:37PM     CLINICAL DATA:  Altered mental status.    EXAM:  CT HEAD WITHOUT CONTRAST    TECHNIQUE:  Contiguous axial images were obtained from the base of the skull  through the vertex without intravenous contrast.    COMPARISON:  CT HEAD W/O CM dated 05/30/2013  FINDINGS:  There is moderate bifrontal cerebral atrophy with milder atrophy  elsewhere within the cerebrum and cerebellum. There is stable  encephalomalacia in the left posterior parieto-occipital region.  There  is an old lacunar infarction in the left caudate nucleus.  There is a lacunar infarction in the genu of the right internal  capsule which has appeared since the previous study. There is no  evidence of an acute intracranial hemorrhage nor of an evolving  ischemic infarction. The cerebellum and brainstem exhibit no acute  abnormalities.    At bone window settings there is no evidence of an acute skull  fracture. No lytic or blastic bony lesion is demonstrated. The  observed portions of the paranasal sinuses and mastoid air cells  appear clear.   IMPRESSION:  1. There is no evidence of an acute ischemic or hemorrhagic event.  2. There is evidence of chronic small vessel ischemic type change.  Since the previous study the patient has developed a lacunar  infarction in the genu of the right internal capsule.  3. There is no intracranial mass effect or hydrocephalus.      Electronically Signed    By: David  Martinique    On: 10/21/2013 16:40         Verified By: DAVID A. Martinique, M.D., MD   Pertinent Past History:  Pertinent Past History CVA HTN Dementia Decrased hearing CKD4   Hospital Course:  Hospital Course 81 year old WM with h/o CVA, basleine demntia, deaf comes in with sob and sats in the 80's  1.acute encephalopathy and SIRS with possibel Right LL atectasis vs infitrlate - patient is from group home. -po levaquin -WBC normal -no fever - blood cx neg and pending sputum cultures (not sure if pt will be able to give sample)  -currently sats ok . weaned to [facility] -on nebulizers p.r.n.  - CT scan of the head shows no acute findings although there is h/o of old strokes   2.Hypotension.  The patient's came in with blood pressure is still low in the 90s.  -recived IV boluse NS. bp satble. decrease rate - Patient does have a right IJ central line placed in ER.  -bp stable  3.acute hypoxic respiratory failure due to acute diastolic CHF and possible atelecatsis -Echo Doppler in  nov 2014 showed ejection fraction of 55% to 60%, normal global left ventricular systolic function,impaired relaxation pattern of LV diastolic filling, moderate to severe aortic valve stenosis, mean gradient was 30 mmHg with a valve area of 0.085 and mild tricuspid regurgitation.  -IV lasix  x2 doses. great UOP, breathing much improved -prn nebs -no wheezing. hold steroids -lasix 20 mg po daily  4.Hypertension. -bp stable -had hypotension on admission  5.Chronic kidney disease stage III, fairly stable. The patient???s baseline creatinine seems to be around 2.2-2.5.   6.Deep venous thrombosis prophylaxis with heparin.  Today on exam Lungs CTA S1, S2 Awake. Abd-soft, NT, BS+  Pt has a IJ triple lumen. This needs to be removed when not needed to reduce risk of CABSI  Discharge time spent 45 minutes   Condition on Discharge Guarded   Code Status:  Code Status No Code/Do Not Resuscitate   DISCHARGE INSTRUCTIONS HOME MEDS:  Medication Reconciliation: Patient's Home Medications at Discharge:     Medication Instructions  amlodipine 5 mg oral tablet  1 tab(s) orally once a day   namenda 5 mg oral tablet  1 tab(s) orally once a day   omeprazole 20 mg oral delayed release capsule  1 cap(s) orally 2 times a day   sodium bicarbonate 650 mg oral tablet  1 tab(s) orally 2 times a day   cetirizine 10 mg oral tablet  1 tab(s) orally once a day (at bedtime)   donepezil 10 mg oral tablet  1 tab(s) orally once a day (at bedtime)   pravastatin 10 mg oral tablet  1 tab(s) orally once a day (at bedtime)   tylenol 650 mg oral tablet, extended release  1 tab(s) orally every 8 hours, As Needed - for Pain   combivent respimat cfc free 100 mcg-20 mcg/inh inhalation aerosol  1 puff(s) inhaled every 6 hours   advair diskus 250 mcg-50 mcg inhalation powder  1 puff(s) inhaled 2 times a day   flonase 50 mcg/inh nasal spray  2 spray(s) nasal once a day   mirtazapine 15 mg oral tablet  1 tab(s) orally once  a day (at bedtime)   aspirin 81 mg oral delayed release tablet  1 tab(s) orally once a day   heparin  5000 unit(s) subcutaneous every 8 hours   docusate sodium 100 mg oral capsule  2 cap(s) orally 2 times a day   polyethylene glycol 3350 oral powder for reconstitution  17 gram(s) orally once a day, As needed, constipation   levofloxacin 250 mg oral tablet  1 tab(s) orally once a day   ensure plus *  237 milliliter(s) orally 3 times a day (with meals)     Physician's Instructions:  Diet Low Sodium   Diet Consistency Mechanical Soft with Puree Meats  Nectar Thick Liquids   Activity Limitations As tolerated   Return to Work Not Applicable   Time frame for Follow Up Appointment 1-2 days  At [employer]: Dvonte Gatliff, Lottie Dawson (MD)  (Signed 13-Apr-15 14:43)  Authored: ADMISSION DATE AND DIAGNOSIS, CHIEF COMPLAINT/HPI, Allergies, PERTINENT LABS, PERTINENT RADIOLOGY STUDIES, PERTINENT PAST HISTORY, HOSPITAL COURSE, DISCHARGE INSTRUCTIONS HOME MEDS, PATIENT INSTRUCTIONS   Last  Updated: 13-Apr-15 14:43 by Alba Destine (MD)

## 2014-11-06 NOTE — H&P (Signed)
PATIENT NAME:  Elijah Ramos, Elijah Ramos MR#:  [id number] DATE OF BIRTH:  April 01, 1926  DATE OF ADMISSION:  10/21/2013  CHIEF COMPLAINT: Confusion and low oxygen levels.   HISTORY OF PRESENTING ILLNESS: An 81 year old Caucasian male patient with history of CVA, dementia, COPD, hypertension, bradycardia, prior alcoholism, presents to the hospital with complaints of altered mental status in from group home. The patient was also found to have sats of 86% on room air. His chest x-ray suggests possible right lower lobe pneumonia, small pleural effusion, and is being admitted to the hospitalist service.   The patient's blood pressure initially was extremely low at 80/48, had a right IJ central line placed, has received a liter bolus of normal saline, and his blood pressure presently is 91/53. He is on 2 liters oxygen and saturations are 92% to 93%.   His urinalysis shows 3 WBCs and trace bacteria.   The patient is unable to contribute to history.  He is deaf, has significant dementia, and is trying to talk, only 1 to 2 words which are very slurred.   PAST MEDICAL HISTORY:  1. Chronic obstructive pulmonary disease.  2. Bradycardia.  3. Hypertension.  4. Dementia.  5. CKD stage III.  6. Alcoholism.  7. DVT.  8. Chronic knee pain.   SOCIAL HISTORY: No smoking, a former alcoholic.   FAMILY HISTORY: Reviewed but unknown.   ALLERGIES:  BETA BLOCKERS, NSAIDS, PENICILLIN, AND SULFA.   SURGICAL HISTORY: Unknown.   HOME MEDICATIONS:  1. Advair Diskus 250/50 one puff b.i.d.  2. Amlodipine 5 mg daily.  3. Aspirin 81 mg daily.  5. Combivent Respimat 1 puff inhaled 4 times a day. 6. Donepezil 10 mg daily.  7. Flonase 50 mcg 2 sprays nasal once a day.  8. Remeron 15 mg oral once a day.  9. Namenda 5 mg oral once a day.  10. Omeprazole 20 mg oral 2 times a day.  11. Pravastatin 10 mg oral once a day.  12. Sodium bicarbonate 650 mg oral 2 times a day.  13. Tylenol 650 mg oral every 8 hours as needed for  pain.   REVIEW OF SYSTEMS: Unobtainable secondary to patient's encephalopathy.   PHYSICAL EXAMINATION:  VITAL SIGNS: Temperature 97.8, pulse of 72, blood pressure 91/53, initially 80/48, saturating 86% on room air and 95% on 2 liters oxygen.  GENERAL: Obese Caucasian male patient lying in bed, confused, with slurred speech, flat affect.  HEENT: Atraumatic, normocephalic. Oral mucosa moist and pink. External ears and nose normal. Has a small blister on the right side of his lip.   NECK: Supple. No thyromegaly or  lymph nodes. Trachea midline.  CARDIOVASCULAR: S1, S2, systolic murmur. Peripheral pulses 2+. No edema.  RESPIRATORY: Has bilateral rhonchi with some right base crackles.  GASTROINTESTINAL: Soft abdomen, nontender. Bowel sounds present. No organomegaly palpable.  GENITOURINARY: No CVA tenderness or bladder distention.  SKIN: Warm and dry.  NEUROLOGICAL: Seems to move all 4 extremities but does not follow commands. Reflexes are 1+ all over.   LABORATORY STUDIES: Show glucose 99, BUN 32, creatinine 2.45, sodium 144, potassium 4.5, chloride 114, GFR 23. AST, ALT, alkaline phosphatase, bilirubin normal. Troponin less than 0.02. WBC 8.7, hemoglobin 12, platelets of 138, with INR of 1.1. Urinalysis shows trace bacteria, 3 WBCs.   ABG shows pH of 7.41 with pCO2 of 32, pO2 of 66, lactic acid 1.1.   RADIOLOGICAL DATA:  Chest x-ray shows possible right lower lobe infiltrate versus atelectasis.   CT of the head  shows atrophy. Stable encephalomalacia in the left posterior parietal occipital region. There is also an old lacunar infarction in the left caudate nucleus. No evidence of acute intracranial hemorrhage or ischemic stroke. There is also a lacunar infarction in the right internal capsule which is new compared to prior CT scan of the head which is in 05/2013.   EKG shows normal sinus rhythm with PVCs.   ASSESSMENT AND PLAN:  1. Right lower lobe pneumonia with acute encephalopathy and  severe sepsis in a patient from group home. We will start him on antibiotics for possible aspiration with meropenem and Levaquin. He does have penicillin allergy. We will wait for blood and sputum cultures. The patient also has acute respiratory failure for which he will be on oxygen to keep his sats over 90%. We will put him on nebulizers p.r.n. The patient's CT scan of the head shows no acute findings, although there are old strokes patient has.  2. Hypotension. The patient's blood pressure is still low in the 90s. We will bolus another 500 mL of normal saline. Patient does have a central line placed in ER.  3. Chronic obstructive pulmonary disease, stable.  4. Hypertension. Hold medications.  5. Chronic kidney disease stage III, fairly stable. The patient's baseline creatinine seems to be around 2.2-2.5.  6. Deep venous thrombosis prophylaxis with heparin.   CODE STATUS: Full code.   TIME SPENT TODAY ON THIS CASE: 55 minutes.    ____________________________ Molinda BailiffSrikar R. Shaquill Iseman, MD srs:dd D: 10/21/2013 18:10:18 ET T: 10/21/2013 18:22:40 ET JOB#: [id number]  cc: Wardell HeathSrikar R. Jes Costales, MD, <Dictator> Orie FishermanSRIKAR R Lovelace Cerveny MD ELECTRONICALLY SIGNED 10/22/2013 10:41

## 2014-11-19 ENCOUNTER — Non-Acute Institutional Stay (SKILLED_NURSING_FACILITY): Payer: Medicare Other | Admitting: Nurse Practitioner

## 2014-11-19 DIAGNOSIS — N183 Chronic kidney disease, stage 3 unspecified: Secondary | ICD-10-CM

## 2014-11-19 DIAGNOSIS — I509 Heart failure, unspecified: Secondary | ICD-10-CM | POA: Diagnosis not present

## 2014-11-19 DIAGNOSIS — I10 Essential (primary) hypertension: Secondary | ICD-10-CM

## 2014-11-19 DIAGNOSIS — D509 Iron deficiency anemia, unspecified: Secondary | ICD-10-CM | POA: Diagnosis not present

## 2014-11-19 DIAGNOSIS — K5909 Other constipation: Secondary | ICD-10-CM | POA: Diagnosis not present

## 2014-11-19 DIAGNOSIS — F0390 Unspecified dementia without behavioral disturbance: Secondary | ICD-10-CM | POA: Diagnosis not present

## 2014-11-19 NOTE — Progress Notes (Signed)
Patient ID: Elijah Ramos, male   DOB: 05-04-1926, 81 y.o.   MRN: [id number]    Nursing Home Location:  [place] [facility] Service: SNF (31)  PCP: No primary care provider on file.  Allergies  Allergen Reactions  . Aleve [Naproxen Sodium]   . Beta Adrenergic Blockers   . Penicillins   . Sulfa Antibiotics     Chief Complaint  Patient presents with  . Medical Management of Chronic Issues    HPI:  Patient is a 81 y.o. male seen today at [facility] for routine follow up on chronic conditions. Pt with a pmh of CKD, deafness, dementia, dysphagia, CVA, hx of DVT. Pt is very HOH making ROS difficult to obtain. there has been no acute issues over the last month. Staff notes pt with frequent BMs that are loose. No diarrhea noted.     Review of Systems:  Review of Systems  Unable to perform ROS pt is severely HOH unable to perform ROS  Past Medical History  Diagnosis Date  . Protein calorie malnutrition   . Dysphagia   . CHF (congestive heart failure)   . CKD (chronic kidney disease)   . Dementia without behavioral disturbance   . Hypertension   . Personal history of DVT (deep vein thrombosis)   . H/O: alcohol abuse   . Stroke   . PNA (pneumonia)     RLL  . H/O acute respiratory failure   . Deafness, bilateral     reads lips  . Hyperkalemia   . Bilateral chronic knee pain   . Late effects of CVA (cerebrovascular accident)    No past surgical history on file. Social History:   reports that he drinks alcohol. His tobacco and drug histories are not on file.  No family history on file.  Medications: Patient's Medications  New Prescriptions   No medications on file  Previous Medications   ACIDOPHILUS (RISAQUAD) CAPS CAPSULE    Take 1 capsule by mouth 2 (two) times daily. For digestive health   ASPIRIN 81 MG TABLET    Take 81 mg by mouth daily. For hx cva   ATORVASTATIN (LIPITOR) 10 MG TABLET    Take 5 mg by mouth daily. For CHF   CETIRIZINE (ZYRTEC ALLERGY) 10 MG TABLET    Take 10 mg by mouth at bedtime. For allergies   DOCUSATE SODIUM (COLACE) 100 MG CAPSULE    Take 100 mg by mouth 2 (two) times daily. For constipation   DONEPEZIL (ARICEPT) 10 MG TABLET    Take 10 mg by mouth at bedtime. For dementia   FLUTICASONE (FLONASE) 50 MCG/ACT NASAL SPRAY    Place 2 sprays into both nostrils daily.    FOLIC ACID (FOLVITE) 1 MG TABLET    Take 1 mg by mouth daily.   MEMANTINE (NAMENDA XR) 14 MG CP24 24 HR CAPSULE    Take 14 mg by mouth daily.   OMEPRAZOLE (PRILOSEC) 40 MG CAPSULE    Take 40 mg by mouth daily.   OXYCODONE (OXY-IR) 5 MG CAPSULE    Take 5 mg by mouth every 6 (six) hours as needed. For moderate to severe pain   PANTOPRAZOLE (PROTONIX) 40 MG TABLET    Take 40 mg by mouth daily.   POLYETHYLENE GLYCOL (MIRALAX / GLYCOLAX) PACKET    Take 17 g by mouth daily. For bowel health   SODIUM BICARBONATE 650 MG TABLET    Take 650 mg by mouth  2 (two) times daily. For chronic kidney disease   THIAMINE 100 MG TABLET    Take 100 mg by mouth daily. For supplement  Modified Medications   No medications on file  Discontinued Medications   No medications on file     Physical Exam: Filed Vitals:   11/19/14 1519  BP: 139/79  Pulse: 67  Temp: 98.2 F (36.8 C)  Resp: 18  Weight: 164 lb (74.39 kg)    Physical Exam  Constitutional: He appears well-developed and well-nourished. No distress.  HENT:  Head: Normocephalic and atraumatic.  Neck: Normal range of motion. Neck supple.  Cardiovascular: Normal rate, regular rhythm and normal heart sounds.   Pulmonary/Chest: Effort normal and breath sounds normal.  Abdominal: Soft. Bowel sounds are normal.  Musculoskeletal: He exhibits no edema or tenderness.  Neurological: He is alert.  Skin: Skin is warm and dry. He is not diaphoretic.  Psychiatric: He has a normal mood and affect.    Labs reviewed: Basic Metabolic Panel: No results for input(s): NA, K, CL, CO2, GLUCOSE, BUN,  CREATININE, CALCIUM, MG, PHOS in the last 8760 hours. Liver Function Tests: No results for input(s): AST, ALT, ALKPHOS, BILITOT, PROT, ALBUMIN in the last 8760 hours. No results for input(s): LIPASE, AMYLASE in the last 8760 hours. No results for input(s): AMMONIA in the last 8760 hours. CBC: No results for input(s): WBC, NEUTROABS, HGB, HCT, MCV, PLT in the last 8760 hours. TSH: No results for input(s): TSH in the last 8760 hours. A1C: No results found for: HGBA1C Lipid Panel: No results for input(s): CHOL, HDL, LDLCALC, TRIG, CHOLHDL, LDLDIRECT in the last 8760 hours. CBC with Diff    Result: 05/04/2014 5:12 PM   ( Status: F )       WBC 5.1     4.0-10.5 K/uL SLN   RBC 3.33   L 4.22-5.81 MIL/uL SLN   Hemoglobin 8.3   L 13.0-17.0 g/dL SLN   Hematocrit 27.1   L 39.0-52.0 % SLN   MCV 81.4     78.0-100.0 fL SLN   MCH 24.9   L 26.0-34.0 pg SLN   MCHC 30.6     30.0-36.0 g/dL SLN   RDW 16.1   H 11.5-15.5 % SLN   Platelet Count 217     150-400 K/uL SLN   Granulocyte % 39   L 43-77 % SLN   Absolute Gran 2.0     1.7-7.7 K/uL SLN   Lymph % 43     12-46 % SLN   Absolute Lymph 2.2     0.7-4.0 K/uL SLN   Mono % 12     3-12 % SLN   Absolute Mono 0.6     0.1-1.0 K/uL SLN   Eos % 5     0-5 % SLN   Absolute Eos 0.3     0.0-0.7 K/uL SLN   Baso % 1     0-1 % SLN   Absolute Baso 0.1     0.0-0.1 K/uL SLN   Smear Review Criteria for review not met  SLN   Basic Metabolic Panel    Result: 05/04/2014 4:32 PM   ( Status: F )       Sodium 139     135-145 mEq/L SLN   Potassium 5.7   H 3.5-5.3 mEq/L SLN C Chloride 111     96-112 mEq/L SLN   CO2 21     19-32 mEq/L SLN   Glucose 84     70-99 mg/dL  SLN   BUN 38   H 6-23 mg/dL SLN   Creatinine 2.53   H 0.50-1.35 mg/dL SLN   Calcium 8.1   L 8.4-10.5 mg/dL SLN   Iron    Result: 05/04/2014 4:32 PM   ( Status: F )       Iron 62     42-165 ug/dL SLN   TIBC    Result: 05/04/2014 4:32 PM   ( Status: F )       UIBC 184     125-400 ug/dL SLN   TIBC 246       215-435 ug/dL SLN   %SAT 25     20-55 % SLN   Ferritin    Result: 05/04/2014 3:38 PM   ( Status: F )       Ferritin 43     22-322 ng/mL SLN   Transferrin    Result: 05/04/2014 7:21 PM   ( Status: F )       Transferrin 197   L 200-360 mg/dL SLN Basic Metabolic Panel    Result: 05/08/2014 4:30 PM   ( Status: F )       Sodium 140     135-145 mEq/L SLN   Potassium 5.3     3.5-5.3 mEq/L SLN   Chloride 112     96-112 mEq/L SLN   CO2 21     19-32 mEq/L SLN   Glucose 103   H 70-99 mg/dL SLN   BUN 41   H 6-23 mg/dL SLN   Creatinine 2.76   H 0.50-1.35 mg/dL SLN   Calcium 7.9   L 8.4-10.5 mg/dL SLN  CBC with Diff    Result: 08/30/2014 12:06 PM   ( Status: F )     C WBC 9.8     4.0-10.5 K/uL SLN   RBC 3.86   L 4.22-5.81 MIL/uL SLN   Hemoglobin 11.4   L 13.0-17.0 g/dL SLN   Hematocrit 35.1   L 39.0-52.0 % SLN   MCV 90.9     78.0-100.0 fL SLN   MCH 29.5     26.0-34.0 pg SLN   MCHC 32.5     30.0-36.0 g/dL SLN   RDW 15.9   H 11.5-15.5 % SLN   Platelet Count 168     150-400 K/uL SLN   MPV 10.9     8.6-12.4 fL SLN   Granulocyte % 79   H 43-77 % SLN   Absolute Gran 7.7     1.7-7.7 K/uL SLN   Lymph % 11   L 12-46 % SLN   Absolute Lymph 1.1     0.7-4.0 K/uL SLN   Mono % 9     3-12 % SLN   Absolute Mono 0.9     0.1-1.0 K/uL SLN   Eos % 1     0-5 % SLN   Absolute Eos 0.1     0.0-0.7 K/uL SLN   Baso % 0     0-1 % SLN   Absolute Baso 0.0     0.0-0.1 K/uL SLN   Smear Review Criteria for review not met Assessment/Plan  1. Chronic congestive heart failure, unspecified congestive heart failure type -will follow up echo reveals EF of 55-75%, on no CHF medications, without shortness of breath or worsening edema, will cont to monitor  2. Essential hypertension Controlled, not current on medications  3. Other constipation -loose stools noted, will decrease colace to once daily and monitor  4. CKD (chronic kidney  disease) stage 3, GFR 30-59 ml/min BUN/Cr stable in October 2015, conts on sodium  bicarb, will follow up BMP   5. Dementia without behavioral disturbance Unchanged, pt does well in current environment, no changes to cognitive or functional status. conts on namenda Xr 14 mg and Aricept 10 mg daily  6. Anemia, iron deficiency -remains on iron daily, will follow up CBC and iron studies at this time

## 2014-12-20 ENCOUNTER — Non-Acute Institutional Stay (SKILLED_NURSING_FACILITY): Payer: Medicare Other | Admitting: Internal Medicine

## 2014-12-20 ENCOUNTER — Encounter: Payer: Self-pay | Admitting: Internal Medicine

## 2014-12-20 DIAGNOSIS — N183 Chronic kidney disease, stage 3 unspecified: Secondary | ICD-10-CM

## 2014-12-20 DIAGNOSIS — D509 Iron deficiency anemia, unspecified: Secondary | ICD-10-CM

## 2014-12-20 DIAGNOSIS — F0390 Unspecified dementia without behavioral disturbance: Secondary | ICD-10-CM

## 2014-12-20 NOTE — Assessment & Plan Note (Signed)
In 11/2014 BUN 45/Cr 2.57 so stable from priorl; GFR - 22

## 2014-12-20 NOTE — Assessment & Plan Note (Signed)
No major declines since last visit ;Plan - cont aricept and namenda and monitor for problems

## 2014-12-20 NOTE — Assessment & Plan Note (Signed)
In 11/2014 Hb 11.9/Hct 37 which is stable; Iron 47, %sat 25;Plan -cont iron supplement

## 2014-12-20 NOTE — Progress Notes (Signed)
MRN: [id number] Name: Elijah Ramos  Sex: male Age: 81 y.o. DOB: [birth year]  PSC #: [facility] Facility/Room: 129A Level Of Care: SNF Provider: Merrilee Seashore D Emergency Contacts: Extended Emergency Contact Information Primary Emergency Contact: Hartsough,[name] Address: [address]          [place], [place] [address] [name] Home Phone: [phone] Mobile Phone: [phone] Relation: Son Secondary Emergency Contact: Chiles,[name]  [place] Mobile Phone: [phone] Relation: Other  Code Status: FULL  Allergies: Aleve; Beta adrenergic blockers; Penicillins; and Sulfa antibiotics  Chief Complaint  Patient presents with  . Medical Management of Chronic Issues    HPI: Patient is 81 y.o. male who is being seen for routine issues.  Past Medical History  Diagnosis Date  . Protein calorie malnutrition   . Dysphagia   . CHF (congestive heart failure)   . CKD (chronic kidney disease)   . Dementia without behavioral disturbance   . Hypertension   . Personal history of DVT (deep vein thrombosis)   . H/O: alcohol abuse   . Stroke   . PNA (pneumonia)     RLL  . H/O acute respiratory failure   . Deafness, bilateral     reads lips  . Hyperkalemia   . Bilateral chronic knee pain   . Late effects of CVA (cerebrovascular accident)     History reviewed. No pertinent past surgical history.    Medication List       This list is accurate as of: 12/20/14  2:49 PM.  Always use your most recent med list.               acidophilus Caps capsule  Take 1 capsule by mouth 2 (two) times daily. For digestive health     aspirin 81 MG tablet  Take 81 mg by mouth daily. For hx cva     atorvastatin 10 MG tablet  Commonly known as:  LIPITOR  Take 5 mg by mouth daily. For CHF     docusate sodium 100 MG capsule  Commonly known as:  COLACE  Take 100 mg by mouth 2 (two) times daily. For constipation     donepezil 10 MG tablet  Commonly known  as:  ARICEPT  Take 10 mg by mouth at bedtime. For dementia     fluticasone 50 MCG/ACT nasal spray  Commonly known as:  FLONASE  Place 2 sprays into both nostrils daily.     folic acid 1 MG tablet  Commonly known as:  FOLVITE  Take 1 mg by mouth daily.     memantine 14 MG Cp24 24 hr capsule  Commonly known as:  NAMENDA XR  Take 14 mg by mouth daily.     omeprazole 40 MG capsule  Commonly known as:  PRILOSEC  Take 40 mg by mouth daily.     oxycodone 5 MG capsule  Commonly known as:  OXY-IR  Take 5 mg by mouth every 6 (six) hours as needed. For moderate to severe pain     polyethylene glycol packet  Commonly known as:  MIRALAX / GLYCOLAX  Take 17 g by mouth daily. For bowel health     sodium bicarbonate 650 MG tablet  Take 650 mg by mouth 2 (two) times daily. For chronic kidney disease     thiamine 100 MG tablet  Take 100 mg by mouth daily. For supplement     ZYRTEC ALLERGY 10 MG tablet  Generic drug:  cetirizine  Take 10 mg  by mouth at bedtime. For allergies        No orders of the defined types were placed in this encounter.     There is no immunization history on file for this patient.  History  Substance Use Topics  . Smoking status: Unknown If Ever Smoked  . Smokeless tobacco: Not on file  . Alcohol Use: Yes     Comment: former abuse    Review of Systems  DATA OBTAINED: from patient, nurse GENERAL:  no fevers, fatigue, appetite changes SKIN: No itching, rash HEENT: No complaint RESPIRATORY: No cough, wheezing, SOB CARDIAC: No chest pain, palpitations, lower extremity edema  GI: No abdominal pain, No N/V/D or constipation, No heartburn or reflux  GU: No dysuria, frequency or urgency, or incontinence  MUSCULOSKELETAL: No unrelieved bone/joint pain NEUROLOGIC: No headache, dizziness  PSYCHIATRIC: No overt anxiety or sadness  Filed Vitals:   12/20/14 1434  BP: 130/72  Pulse: 70  Temp: 97.6 F (36.4 C)  Resp: 20    Physical Exam  GENERAL  APPEARANCE: Alert, , WM very HOHNo acute distress  SKIN: No diaphoresis rash HEENT: Unremarkable RESPIRATORY: Breathing is even, unlabored   CARDIOVASCULAR: . No peripheral edema  GASTROINTESTINAL: Abdomen is not distended .  GENITOURINARY: Bladder  not distended  MUSCULOSKELETAL: No abnormal joints or musculature NEUROLOGIC: Cranial nerves 2-12 grossly intact. PSYCHIATRIC: Mood and affect appropriate to situation, with dementtia, no behavioral issues  Patient Active Problem List   Diagnosis Date Noted  . Dysphagia S/P CVA (cerebrovascular accident) 04/29/2014  . Anemia, iron deficiency 04/29/2014  . Allergic rhinitis 01/29/2014  . Constipation 01/29/2014  . Late effects of CVA (cerebrovascular accident)   . Protein calorie malnutrition   . Dysphagia   . Chronic CHF (congestive heart failure)   . CKD (chronic kidney disease) stage 3, GFR 30-59 ml/min   . Dementia without behavioral disturbance   . Hypertension   . Personal history of DVT (deep vein thrombosis)   . H/O: alcohol abuse   . PNA (pneumonia)   . H/O acute respiratory failure   . Deafness, bilateral   . Bilateral chronic knee pain     CBC    Component Value Date/Time   WBC 4.8 11/16/2013 0717   WBC 6.0 10/24/2013 0841   RBC 3.76* 11/16/2013 0717   RBC 3.59* 10/22/2013 0412   HGB 11.0* 11/16/2013 0717   HGB 10.6* 10/22/2013 0412   HCT 35.9* 11/16/2013 0717   HCT 33.0* 10/22/2013 0412   PLT 183 11/16/2013 0717   PLT 170 10/25/2013 0653   MCV 95.5 11/16/2013 0717   MCV 92 10/22/2013 0412   LYMPHSABS 1.8 11/11/2013 0731   LYMPHSABS 1.1 10/22/2013 0412   MONOABS 0.7 11/11/2013 0731   MONOABS 0.6 10/22/2013 0412   EOSABS 0.1 11/11/2013 0731   EOSABS 0.0 10/22/2013 0412   BASOSABS 0.0 11/11/2013 0731   BASOSABS 0.0 10/22/2013 0412    CMP     Component Value Date/Time   NA 145 11/16/2013 0717   NA 147* 10/24/2013 0841   K 5.9* 11/16/2013 0717   K 4.4 10/24/2013 0841   CL 110 11/16/2013 0717   CL 117*  10/24/2013 0841   CO2 26 11/16/2013 0717   CO2 24 10/24/2013 0841   GLUCOSE 81 11/16/2013 0717   GLUCOSE 95 10/24/2013 0841   BUN 44* 11/16/2013 0717   BUN 41* 10/24/2013 0841   CREATININE 2.94* 11/16/2013 0717   CREATININE 2.34* 10/24/2013 0841   CALCIUM 8.7 11/16/2013 0717  CALCIUM 8.5 10/24/2013 0841   PROT 6.3 11/16/2013 0717   PROT 5.7* 10/22/2013 0412   ALBUMIN 2.8* 11/16/2013 0717   ALBUMIN 2.5* 10/22/2013 0412   AST 17 11/16/2013 0717   AST 14* 10/22/2013 0412   ALT 12 11/16/2013 0717   ALT 16 10/22/2013 0412   ALKPHOS 68 11/16/2013 0717   ALKPHOS 66 10/22/2013 0412   BILITOT <0.2* 11/16/2013 0717   GFRNONAA 18* 11/16/2013 0717   GFRNONAA 24* 10/24/2013 0841   GFRAA 21* 11/16/2013 0717   GFRAA 28* 10/24/2013 0841    Assessment and Plan  CKD (chronic kidney disease) stage 3, GFR 30-59 ml/min In 11/2014 BUN 45/Cr 2.57 so stable from priorl; GFR - 22   Anemia, iron deficiency In 11/2014 Hb 11.9/Hct 37 which is stable; Iron 47, %sat 25;Plan -cont iron supplement   Dementia without behavioral disturbance No major declines since last visit ;Plan - cont aricept and namenda and monitor for problems     Margit Hanks, MD

## 2015-01-27 ENCOUNTER — Non-Acute Institutional Stay (SKILLED_NURSING_FACILITY): Payer: Medicare Other | Admitting: Nurse Practitioner

## 2015-01-27 DIAGNOSIS — M179 Osteoarthritis of knee, unspecified: Secondary | ICD-10-CM

## 2015-01-27 DIAGNOSIS — D509 Iron deficiency anemia, unspecified: Secondary | ICD-10-CM

## 2015-01-27 DIAGNOSIS — J309 Allergic rhinitis, unspecified: Secondary | ICD-10-CM | POA: Diagnosis not present

## 2015-01-27 DIAGNOSIS — N183 Chronic kidney disease, stage 3 unspecified: Secondary | ICD-10-CM

## 2015-01-27 DIAGNOSIS — F0390 Unspecified dementia without behavioral disturbance: Secondary | ICD-10-CM

## 2015-01-27 DIAGNOSIS — M1990 Unspecified osteoarthritis, unspecified site: Secondary | ICD-10-CM | POA: Insufficient documentation

## 2015-01-27 DIAGNOSIS — M1710 Unilateral primary osteoarthritis, unspecified knee: Secondary | ICD-10-CM

## 2015-01-27 DIAGNOSIS — H9193 Unspecified hearing loss, bilateral: Secondary | ICD-10-CM | POA: Diagnosis not present

## 2015-01-27 NOTE — Progress Notes (Signed)
Patient ID: Elijah Ramos, male   DOB: 09-Jun-1926, 81 y.o.   MRN: [id number]    Nursing Home Location:  [place] of Service: SNF (31)  PCP: No primary care provider on file.  Allergies  Allergen Reactions  . Aleve [Naproxen Sodium]   . Beta Adrenergic Blockers   . Penicillins   . Sulfa Antibiotics     Chief Complaint  Patient presents with  . Medical Management of Chronic Issues    HPI:  Patient is a 81 y.o. male seen today at [facility] for routine follow up on chronic conditions. Pt with a pmh of CKD, deafness, dementia, dysphagia, CVA, hx of DVT. Pt is very HOH making ROS difficult to obtain. there has been no acute issues over the last month.  Chronic conditions remain stable. Pt does not appear to be in pain. Self propels throughout the facility in a WC. Eating well. No skin issues. No problems with bowel or bladder per nursing.  Staff without concerns at this time.   Review of Systems:  Review of Systems  Unable to perform ROS pt is severely HOH unable to perform ROS   Past Medical History  Diagnosis Date  . Protein calorie malnutrition   . Dysphagia   . CHF (congestive heart failure)   . CKD (chronic kidney disease)   . Dementia without behavioral disturbance   . Hypertension   . Personal history of DVT (deep vein thrombosis)   . H/O: alcohol abuse   . Stroke   . PNA (pneumonia)     RLL  . H/O acute respiratory failure   . Deafness, bilateral     reads lips  . Hyperkalemia   . Bilateral chronic knee pain   . Late effects of CVA (cerebrovascular accident)    No past surgical history on file. Social History:   reports that he drinks alcohol. His tobacco and drug histories are not on file.  No family history on file.  Medications: Patient's Medications  New Prescriptions   No medications on file  Previous Medications   ACIDOPHILUS (RISAQUAD) CAPS CAPSULE    Take 1 capsule by mouth 2 (two) times daily. For  digestive health   ASPIRIN 81 MG TABLET    Take 81 mg by mouth daily. For hx cva   ATORVASTATIN (LIPITOR) 10 MG TABLET    Take 5 mg by mouth daily. For CHF   CETIRIZINE (ZYRTEC ALLERGY) 10 MG TABLET    Take 10 mg by mouth at bedtime. For allergies   DOCUSATE SODIUM (COLACE) 100 MG CAPSULE    Take 100 mg by mouth 2 (two) times daily. For constipation   DONEPEZIL (ARICEPT) 10 MG TABLET    Take 10 mg by mouth at bedtime. For dementia   FLUTICASONE (FLONASE) 50 MCG/ACT NASAL SPRAY    Place 2 sprays into both nostrils daily.    FOLIC ACID (FOLVITE) 1 MG TABLET    Take 1 mg by mouth daily.   MEMANTINE (NAMENDA XR) 14 MG CP24 24 HR CAPSULE    Take 14 mg by mouth daily.   OMEPRAZOLE (PRILOSEC) 40 MG CAPSULE    Take 40 mg by mouth daily.   OXYCODONE (OXY-IR) 5 MG CAPSULE    Take 5 mg by mouth every 6 (six) hours as needed. For moderate to severe pain   POLYETHYLENE GLYCOL (MIRALAX / GLYCOLAX) PACKET    Take 17 g by mouth daily. For bowel health  SODIUM BICARBONATE 650 MG TABLET    Take 650 mg by mouth 2 (two) times daily. For chronic kidney disease   THIAMINE 100 MG TABLET    Take 100 mg by mouth daily. For supplement  Modified Medications   No medications on file  Discontinued Medications   No medications on file     Physical Exam: Filed Vitals:   01/27/15 1146  BP: 142/78  Pulse: 85  Temp: 97.3 F (36.3 C)  Resp: 20  Weight: 166 lb (75.297 kg)    Physical Exam  Constitutional: He appears well-developed and well-nourished. No distress.  HENT:  Head: Normocephalic and atraumatic.  Neck: Normal range of motion. Neck supple.  Cardiovascular: Normal rate, regular rhythm and normal heart sounds.   Pulmonary/Chest: Effort normal and breath sounds normal.  Abdominal: Soft. Bowel sounds are normal.  Musculoskeletal: He exhibits no edema or tenderness.  Neurological: He is alert.  Skin: Skin is warm and dry. He is not diaphoretic.  Psychiatric: He has a normal mood and affect.    Labs  reviewed: Basic Metabolic Panel: No results for input(s): NA, K, CL, CO2, GLUCOSE, BUN, CREATININE, CALCIUM, MG, PHOS in the last 8760 hours. Liver Function Tests: No results for input(s): AST, ALT, ALKPHOS, BILITOT, PROT, ALBUMIN in the last 8760 hours. No results for input(s): LIPASE, AMYLASE in the last 8760 hours. No results for input(s): AMMONIA in the last 8760 hours. CBC: No results for input(s): WBC, NEUTROABS, HGB, HCT, MCV, PLT in the last 8760 hours. TSH: No results for input(s): TSH in the last 8760 hours. A1C: No results found for: HGBA1C Lipid Panel: No results for input(s): CHOL, HDL, LDLCALC, TRIG, CHOLHDL, LDLDIRECT in the last 8760 hours. CBC with Diff    Result: 05/04/2014 5:12 PM   ( Status: F )       WBC 5.1     4.0-10.5 K/uL SLN   RBC 3.33   L 4.22-5.81 MIL/uL SLN   Hemoglobin 8.3   L 13.0-17.0 g/dL SLN   Hematocrit 27.1   L 39.0-52.0 % SLN   MCV 81.4     78.0-100.0 fL SLN   MCH 24.9   L 26.0-34.0 pg SLN   MCHC 30.6     30.0-36.0 g/dL SLN   RDW 16.1   H 11.5-15.5 % SLN   Platelet Count 217     150-400 K/uL SLN   Granulocyte % 39   L 43-77 % SLN   Absolute Gran 2.0     1.7-7.7 K/uL SLN   Lymph % 43     12-46 % SLN   Absolute Lymph 2.2     0.7-4.0 K/uL SLN   Mono % 12     3-12 % SLN   Absolute Mono 0.6     0.1-1.0 K/uL SLN   Eos % 5     0-5 % SLN   Absolute Eos 0.3     0.0-0.7 K/uL SLN   Baso % 1     0-1 % SLN   Absolute Baso 0.1     0.0-0.1 K/uL SLN   Smear Review Criteria for review not met  SLN   Basic Metabolic Panel    Result: 05/04/2014 4:32 PM   ( Status: F )       Sodium 139     135-145 mEq/L SLN   Potassium 5.7   H 3.5-5.3 mEq/L SLN C Chloride 111     96-112 mEq/L SLN   CO2 21  19-32 mEq/L SLN   Glucose 84     70-99 mg/dL SLN   BUN 38   H 6-23 mg/dL SLN   Creatinine 2.53   H 0.50-1.35 mg/dL SLN   Calcium 8.1   L 8.4-10.5 mg/dL SLN   Iron    Result: 05/04/2014 4:32 PM   ( Status: F )       Iron 62     42-165 ug/dL SLN   TIBC    Result:  05/04/2014 4:32 PM   ( Status: F )       UIBC 184     125-400 ug/dL SLN   TIBC 246     215-435 ug/dL SLN   %SAT 25     20-55 % SLN   Ferritin    Result: 05/04/2014 3:38 PM   ( Status: F )       Ferritin 43     22-322 ng/mL SLN   Transferrin    Result: 05/04/2014 7:21 PM   ( Status: F )       Transferrin 197   L 200-360 mg/dL SLN Basic Metabolic Panel    Result: 05/08/2014 4:30 PM   ( Status: F )       Sodium 140     135-145 mEq/L SLN   Potassium 5.3     3.5-5.3 mEq/L SLN   Chloride 112     96-112 mEq/L SLN   CO2 21     19-32 mEq/L SLN   Glucose 103   H 70-99 mg/dL SLN   BUN 41   H 6-23 mg/dL SLN   Creatinine 2.76   H 0.50-1.35 mg/dL SLN   Calcium 7.9   L 8.4-10.5 mg/dL SLN  CBC with Diff    Result: 08/30/2014 12:06 PM   ( Status: F )     C WBC 9.8     4.0-10.5 K/uL SLN   RBC 3.86   L 4.22-5.81 MIL/uL SLN   Hemoglobin 11.4   L 13.0-17.0 g/dL SLN   Hematocrit 35.1   L 39.0-52.0 % SLN   MCV 90.9     78.0-100.0 fL SLN   MCH 29.5     26.0-34.0 pg SLN   MCHC 32.5     30.0-36.0 g/dL SLN   RDW 15.9   H 11.5-15.5 % SLN   Platelet Count 168     150-400 K/uL SLN   MPV 10.9     8.6-12.4 fL SLN   Granulocyte % 79   H 43-77 % SLN   Absolute Gran 7.7     1.7-7.7 K/uL SLN   Lymph % 11   L 12-46 % SLN   Absolute Lymph 1.1     0.7-4.0 K/uL SLN   Mono % 9     3-12 % SLN   Absolute Mono 0.9     0.1-1.0 K/uL SLN   Eos % 1     0-5 % SLN   Absolute Eos 0.1     0.0-0.7 K/uL SLN   Baso % 0     0-1 % SLN   Absolute Baso 0.0     0.0-0.1 K/uL SLN   Smear Review Criteria for review not met  Iron and IBC    Result: 11/22/2014 6:17 PM   ( Status: F )       Iron 47     42-165 ug/dL SLN   UIBC 140     125-400 ug/dL SLN   TIBC 187   L 215-435 ug/dL  SLN   %SAT 25     20-55 % SLN   BMP with Estimated GFR    Result: 11/22/2014 6:17 PM   ( Status: F )       Sodium 141     135-145 mEq/L SLN   Potassium 5.1     3.5-5.3 mEq/L SLN   Chloride 111     96-112 mEq/L SLN   CO2 24     19-32 mEq/L SLN    Glucose 76     70-99 mg/dL SLN   BUN 45   H 6-23 mg/dL SLN   Creatinine 2.57   H 0.50-1.35 mg/dL SLN   Calcium 8.1   L 8.4-10.5 mg/dL SLN   Est GFR, African American 25   L  mL/min SLN   Est GFR, NonAfrican American 22   L  mL/min SLN C CBC with Diff    Result: 11/22/2014 5:36 PM   ( Status: F )       WBC 5.2     4.0-10.5 K/uL SLN   RBC 3.89   L 4.22-5.81 MIL/uL SLN   Hemoglobin 11.9   L 13.0-17.0 g/dL SLN   Hematocrit 37.0   L 39.0-52.0 % SLN   MCV 95.1     78.0-100.0 fL SLN   MCH 30.6     26.0-34.0 pg SLN   MCHC 32.2     30.0-36.0 g/dL SLN   RDW 15.1     11.5-15.5 % SLN   Platelet Count 168     150-400 K/uL SLN   MPV 12.0     8.6-12.4 fL SLN   Granulocyte % 46     43-77 % SLN   Absolute Gran 2.4     1.7-7.7 K/uL SLN   Lymph % 32     12-46 % SLN   Absolute Lymph 1.7     0.7-4.0 K/uL SLN   Mono % 14   H 3-12 % SLN   Absolute Mono 0.7     0.1-1.0 K/uL SLN   Eos % 7   H 0-5 % SLN   Absolute Eos 0.4     0.0-0.7 K/uL SLN   Baso % 1     0-1 % SLN   Absolute Baso 0.1 Assessment/Plan  1. Allergic Rhinitis Stable on flonase and zyrtec   2. Osteoarthritis of knee, unspecified laterality, unspecified osteoarthritis type Without increase in pain, conts on PRN medication  3. Deafness, bilateral Stable, unchanged  4. Dementia without behavioral disturbance Stable at this time, no worsening of symptoms. conts on Aricept daily  5. CKD (chronic kidney disease) stage 3, GFR 30-59 ml/min Stable, cont on bicarb twice daily  6. Anemia, iron deficiency hgb improved on last CBC, conts on iron

## 2015-03-16 ENCOUNTER — Non-Acute Institutional Stay (SKILLED_NURSING_FACILITY): Payer: Medicare Other | Admitting: Nurse Practitioner

## 2015-03-16 DIAGNOSIS — M1710 Unilateral primary osteoarthritis, unspecified knee: Secondary | ICD-10-CM

## 2015-03-16 DIAGNOSIS — M179 Osteoarthritis of knee, unspecified: Secondary | ICD-10-CM | POA: Diagnosis not present

## 2015-03-16 DIAGNOSIS — F0390 Unspecified dementia without behavioral disturbance: Secondary | ICD-10-CM

## 2015-03-16 DIAGNOSIS — K5909 Other constipation: Secondary | ICD-10-CM

## 2015-03-16 DIAGNOSIS — I6990 Unspecified sequelae of unspecified cerebrovascular disease: Secondary | ICD-10-CM

## 2015-03-16 DIAGNOSIS — H9193 Unspecified hearing loss, bilateral: Secondary | ICD-10-CM | POA: Diagnosis not present

## 2015-03-16 NOTE — Addendum Note (Signed)
Addended by: Sharon Seller on: 03/16/2015 04:07 PM   Modules accepted: Level of Service

## 2015-03-16 NOTE — Progress Notes (Signed)
Patient ID: Elijah Ramos, male   DOB: [birth year], 81 y.o.   MRN: [id number]    Nursing Home Location:  [place] of Service: SNF (31)  PCP: No primary care provider on file.  Allergies  Allergen Reactions  . Aleve [Naproxen Sodium]   . Beta Adrenergic Blockers   . Penicillins   . Sulfa Antibiotics     Chief Complaint  Patient presents with  . Medical Management of Chronic Issues    HPI:  Patient is a 81 y.o. male seen today at [facility] for routine follow up on chronic conditions. Pt with a pmh of CKD, deafness, dementia, dysphagia, CVA, hx of DVT. Pt is very HOH making ROS difficult to obtain.  Pt has been stable in the last month. There has been no acute issues reported.  No increase in pain. Bowels moving good. Pt with good PO intake.  Review of Systems:  Review of Systems  Unable to perform ROS pt is severely HOH unable to perform ROS   Past Medical History  Diagnosis Date  . Protein calorie malnutrition   . Dysphagia   . CHF (congestive heart failure)   . CKD (chronic kidney disease)   . Dementia without behavioral disturbance   . Hypertension   . Personal history of DVT (deep vein thrombosis)   . H/O: alcohol abuse   . Stroke   . PNA (pneumonia)     RLL  . H/O acute respiratory failure   . Deafness, bilateral     reads lips  . Hyperkalemia   . Bilateral chronic knee pain   . Late effects of CVA (cerebrovascular accident)    No past surgical history on file. Social History:   reports that he drinks alcohol. His tobacco and drug histories are not on file.  No family history on file.  Medications: Patient's Medications  New Prescriptions   No medications on file  Previous Medications   ACIDOPHILUS (RISAQUAD) CAPS CAPSULE    Take 1 capsule by mouth 2 (two) times daily. For digestive health   ASPIRIN 81 MG TABLET    Take 81 mg by mouth daily. For hx cva   ATORVASTATIN (LIPITOR) 10 MG TABLET    Take 5 mg by  mouth daily. For CHF   CETIRIZINE (ZYRTEC ALLERGY) 10 MG TABLET    Take 10 mg by mouth at bedtime. For allergies   DOCUSATE SODIUM (COLACE) 100 MG CAPSULE    Take 100 mg by mouth daily. For constipation   DONEPEZIL (ARICEPT) 10 MG TABLET    Take 10 mg by mouth at bedtime. For dementia   FLUTICASONE (FLONASE) 50 MCG/ACT NASAL SPRAY    Place 2 sprays into both nostrils daily.    FOLIC ACID (FOLVITE) 1 MG TABLET    Take 1 mg by mouth daily.   MEMANTINE (NAMENDA XR) 14 MG CP24 24 HR CAPSULE    Take 14 mg by mouth daily.   OMEPRAZOLE (PRILOSEC) 40 MG CAPSULE    Take 40 mg by mouth daily.   OXYCODONE (OXY-IR) 5 MG CAPSULE    Take 5 mg by mouth every 6 (six) hours as needed. For moderate to severe pain   POLYETHYLENE GLYCOL (MIRALAX / GLYCOLAX) PACKET    Take 17 g by mouth daily. For bowel health   SODIUM BICARBONATE 650 MG TABLET    Take 650 mg by mouth 2 (two) times daily. For chronic kidney disease   THIAMINE 100 MG  TABLET    Take 100 mg by mouth daily. For supplement  Modified Medications   No medications on file  Discontinued Medications   No medications on file     Physical Exam: Filed Vitals:   03/16/15 1433  BP: 107/55  Pulse: 68  Temp: 97.7 F (36.5 C)  Resp: 20  Weight: 166 lb (75.297 kg)    Physical Exam  Constitutional: He appears well-developed and well-nourished. No distress.  HENT:  Head: Normocephalic and atraumatic.  Neck: Normal range of motion. Neck supple.  Cardiovascular: Normal rate, regular rhythm and normal heart sounds.   Pulmonary/Chest: Effort normal and breath sounds normal.  Abdominal: Soft. Bowel sounds are normal.  Musculoskeletal: He exhibits no edema or tenderness.  Neurological: He is alert.  Skin: Skin is warm and dry. He is not diaphoretic.  Psychiatric: He has a normal mood and affect.    Labs reviewed: Basic Metabolic Panel: No results for input(s): NA, K, CL, CO2, GLUCOSE, BUN, CREATININE, CALCIUM, MG, PHOS in the last 8760 hours. Liver  Function Tests: No results for input(s): AST, ALT, ALKPHOS, BILITOT, PROT, ALBUMIN in the last 8760 hours. No results for input(s): LIPASE, AMYLASE in the last 8760 hours. No results for input(s): AMMONIA in the last 8760 hours. CBC: No results for input(s): WBC, NEUTROABS, HGB, HCT, MCV, PLT in the last 8760 hours. TSH: No results for input(s): TSH in the last 8760 hours. A1C: No results found for: HGBA1C Lipid Panel: No results for input(s): CHOL, HDL, LDLCALC, TRIG, CHOLHDL, LDLDIRECT in the last 8760 hours. CBC with Diff    Result: 05/04/2014 5:12 PM   ( Status: F )       WBC 5.1     4.0-10.5 K/uL SLN   RBC 3.33   L 4.22-5.81 MIL/uL SLN   Hemoglobin 8.3   L 13.0-17.0 g/dL SLN   Hematocrit 27.1   L 39.0-52.0 % SLN   MCV 81.4     78.0-100.0 fL SLN   MCH 24.9   L 26.0-34.0 pg SLN   MCHC 30.6     30.0-36.0 g/dL SLN   RDW 16.1   H 11.5-15.5 % SLN   Platelet Count 217     150-400 K/uL SLN   Granulocyte % 39   L 43-77 % SLN   Absolute Gran 2.0     1.7-7.7 K/uL SLN   Lymph % 43     12-46 % SLN   Absolute Lymph 2.2     0.7-4.0 K/uL SLN   Mono % 12     3-12 % SLN   Absolute Mono 0.6     0.1-1.0 K/uL SLN   Eos % 5     0-5 % SLN   Absolute Eos 0.3     0.0-0.7 K/uL SLN   Baso % 1     0-1 % SLN   Absolute Baso 0.1     0.0-0.1 K/uL SLN   Smear Review Criteria for review not met  SLN   Basic Metabolic Panel    Result: 05/04/2014 4:32 PM   ( Status: F )       Sodium 139     135-145 mEq/L SLN   Potassium 5.7   H 3.5-5.3 mEq/L SLN C Chloride 111     96-112 mEq/L SLN   CO2 21     19-32 mEq/L SLN   Glucose 84     70-99 mg/dL SLN   BUN 38   H 6-23 mg/dL SLN  Creatinine 2.53   H 0.50-1.35 mg/dL SLN   Calcium 8.1   L 8.4-10.5 mg/dL SLN   Iron    Result: 05/04/2014 4:32 PM   ( Status: F )       Iron 62     42-165 ug/dL SLN   TIBC    Result: 05/04/2014 4:32 PM   ( Status: F )       UIBC 184     125-400 ug/dL SLN   TIBC 246     215-435 ug/dL SLN   %SAT 25     20-55 % SLN   Ferritin     Result: 05/04/2014 3:38 PM   ( Status: F )       Ferritin 43     22-322 ng/mL SLN   Transferrin    Result: 05/04/2014 7:21 PM   ( Status: F )       Transferrin 197   L 200-360 mg/dL SLN Basic Metabolic Panel    Result: 05/08/2014 4:30 PM   ( Status: F )       Sodium 140     135-145 mEq/L SLN   Potassium 5.3     3.5-5.3 mEq/L SLN   Chloride 112     96-112 mEq/L SLN   CO2 21     19-32 mEq/L SLN   Glucose 103   H 70-99 mg/dL SLN   BUN 41   H 6-23 mg/dL SLN   Creatinine 2.76   H 0.50-1.35 mg/dL SLN   Calcium 7.9   L 8.4-10.5 mg/dL SLN  CBC with Diff    Result: 08/30/2014 12:06 PM   ( Status: F )     C WBC 9.8     4.0-10.5 K/uL SLN   RBC 3.86   L 4.22-5.81 MIL/uL SLN   Hemoglobin 11.4   L 13.0-17.0 g/dL SLN   Hematocrit 35.1   L 39.0-52.0 % SLN   MCV 90.9     78.0-100.0 fL SLN   MCH 29.5     26.0-34.0 pg SLN   MCHC 32.5     30.0-36.0 g/dL SLN   RDW 15.9   H 11.5-15.5 % SLN   Platelet Count 168     150-400 K/uL SLN   MPV 10.9     8.6-12.4 fL SLN   Granulocyte % 79   H 43-77 % SLN   Absolute Gran 7.7     1.7-7.7 K/uL SLN   Lymph % 11   L 12-46 % SLN   Absolute Lymph 1.1     0.7-4.0 K/uL SLN   Mono % 9     3-12 % SLN   Absolute Mono 0.9     0.1-1.0 K/uL SLN   Eos % 1     0-5 % SLN   Absolute Eos 0.1     0.0-0.7 K/uL SLN   Baso % 0     0-1 % SLN   Absolute Baso 0.0     0.0-0.1 K/uL SLN   Smear Review Criteria for review not met  Iron and IBC    Result: 11/22/2014 6:17 PM   ( Status: F )       Iron 47     42-165 ug/dL SLN   UIBC 140     125-400 ug/dL SLN   TIBC 187   L 215-435 ug/dL SLN   %SAT 25     20-55 % SLN   BMP with Estimated GFR    Result: 11/22/2014 6:17 PM   (  Status: F )       Sodium 141     135-145 mEq/L SLN   Potassium 5.1     3.5-5.3 mEq/L SLN   Chloride 111     96-112 mEq/L SLN   CO2 24     19-32 mEq/L SLN   Glucose 76     70-99 mg/dL SLN   BUN 45   H 6-23 mg/dL SLN   Creatinine 2.57   H 0.50-1.35 mg/dL SLN   Calcium 8.1   L 8.4-10.5 mg/dL SLN   Est GFR, African  American 25   L  mL/min SLN   Est GFR, NonAfrican American 22   L  mL/min SLN C CBC with Diff    Result: 11/22/2014 5:36 PM   ( Status: F )       WBC 5.2     4.0-10.5 K/uL SLN   RBC 3.89   L 4.22-5.81 MIL/uL SLN   Hemoglobin 11.9   L 13.0-17.0 g/dL SLN   Hematocrit 37.0   L 39.0-52.0 % SLN   MCV 95.1     78.0-100.0 fL SLN   MCH 30.6     26.0-34.0 pg SLN   MCHC 32.2     30.0-36.0 g/dL SLN   RDW 15.1     11.5-15.5 % SLN   Platelet Count 168     150-400 K/uL SLN   MPV 12.0     8.6-12.4 fL SLN   Granulocyte % 46     43-77 % SLN   Absolute Gran 2.4     1.7-7.7 K/uL SLN   Lymph % 32     12-46 % SLN   Absolute Lymph 1.7     0.7-4.0 K/uL SLN   Mono % 14   H 3-12 % SLN   Absolute Mono 0.7     0.1-1.0 K/uL SLN   Eos % 7   H 0-5 % SLN   Absolute Eos 0.4     0.0-0.7 K/uL SLN   Baso % 1     0-1 % SLN   Absolute Baso 0.1 Assessment/Plan  1. Other constipation -remains stable on current regimen  2. Dementia without behavioral disturbance -unchanged without acute decline. conts on aricept and namenda   3. Osteoarthritis of knee, unspecified laterality, unspecified osteoarthritis type -pain remains stable, conts on PRN  4. Late effects of CVA (cerebrovascular accident) Stable without worsening of symptoms, conts on ASA 81 mg daily  5. Deafness, bilateral -unchanged, uses communication boards and staff assistance.

## 2015-04-22 ENCOUNTER — Non-Acute Institutional Stay (SKILLED_NURSING_FACILITY): Payer: Medicare Other | Admitting: Nurse Practitioner

## 2015-04-22 DIAGNOSIS — M179 Osteoarthritis of knee, unspecified: Secondary | ICD-10-CM

## 2015-04-22 DIAGNOSIS — I6990 Unspecified sequelae of unspecified cerebrovascular disease: Secondary | ICD-10-CM | POA: Diagnosis not present

## 2015-04-22 DIAGNOSIS — K5909 Other constipation: Secondary | ICD-10-CM

## 2015-04-22 DIAGNOSIS — J309 Allergic rhinitis, unspecified: Secondary | ICD-10-CM

## 2015-04-22 DIAGNOSIS — D509 Iron deficiency anemia, unspecified: Secondary | ICD-10-CM | POA: Diagnosis not present

## 2015-04-22 DIAGNOSIS — N183 Chronic kidney disease, stage 3 unspecified: Secondary | ICD-10-CM

## 2015-04-22 DIAGNOSIS — M1710 Unilateral primary osteoarthritis, unspecified knee: Secondary | ICD-10-CM

## 2015-04-22 NOTE — Progress Notes (Signed)
Patient ID: Elijah Ramos, male   DOB: [birth year], 81 y.o.   MRN: [id number]    Nursing Home Location:  [place] of Service: SNF (31)  PCP: No primary care provider on file.  Allergies  Allergen Reactions  . Aleve [Naproxen Sodium]   . Beta Adrenergic Blockers   . Penicillins   . Sulfa Antibiotics     Chief Complaint  Patient presents with  . Medical Management of Chronic Issues    HPI:  Patient is a 81 y.o. male seen today at [facility] for routine follow up on chronic conditions. Pt with a pmh of CKD, deafness, dementia, dysphagia, CVA, hx of DVT. Pt is very HOH making ROS difficult to obtain.  Pt has been doing well in the last month. There has been no acute issues. Pts cognitive status remains at baseline. Pt denies worsening knee pain. Appetite remains stable. Bowels and bladder stable. Staff without acute concerns.   Review of Systems:  Review of Systems  Unable to perform ROS pt is severely HOH unable to perform ROS   Past Medical History  Diagnosis Date  . Protein calorie malnutrition   . Dysphagia   . CHF (congestive heart failure)   . CKD (chronic kidney disease)   . Dementia without behavioral disturbance   . Hypertension   . Personal history of DVT (deep vein thrombosis)   . H/O: alcohol abuse   . Stroke   . PNA (pneumonia)     RLL  . H/O acute respiratory failure   . Deafness, bilateral     reads lips  . Hyperkalemia   . Bilateral chronic knee pain   . Late effects of CVA (cerebrovascular accident)    No past surgical history on file. Social History:   reports that he drinks alcohol. His tobacco and drug histories are not on file.  No family history on file.  Medications: Patient's Medications  New Prescriptions   No medications on file  Previous Medications   ACIDOPHILUS (RISAQUAD) CAPS CAPSULE    Take 1 capsule by mouth 2 (two) times daily. For digestive health   ASPIRIN 81 MG TABLET    Take 81 mg  by mouth daily. For hx cva   ATORVASTATIN (LIPITOR) 10 MG TABLET    Take 5 mg by mouth daily. For CHF   CETIRIZINE (ZYRTEC ALLERGY) 10 MG TABLET    Take 10 mg by mouth at bedtime. For allergies   DOCUSATE SODIUM (COLACE) 100 MG CAPSULE    Take 100 mg by mouth daily. For constipation   DONEPEZIL (ARICEPT) 10 MG TABLET    Take 10 mg by mouth at bedtime. For dementia   FLUTICASONE (FLONASE) 50 MCG/ACT NASAL SPRAY    Place 2 sprays into both nostrils daily.    FOLIC ACID (FOLVITE) 1 MG TABLET    Take 1 mg by mouth daily.   MEMANTINE (NAMENDA XR) 14 MG CP24 24 HR CAPSULE    Take 14 mg by mouth daily.   OMEPRAZOLE (PRILOSEC) 40 MG CAPSULE    Take 40 mg by mouth daily.   OXYCODONE (OXY-IR) 5 MG CAPSULE    Take 5 mg by mouth every 6 (six) hours as needed. For moderate to severe pain   POLYETHYLENE GLYCOL (MIRALAX / GLYCOLAX) PACKET    Take 17 g by mouth daily. For bowel health   SODIUM BICARBONATE 650 MG TABLET    Take 650 mg by mouth 2 (two) times  daily. For chronic kidney disease   THIAMINE 100 MG TABLET    Take 100 mg by mouth daily. For supplement  Modified Medications   No medications on file  Discontinued Medications   No medications on file     Physical Exam: Filed Vitals:   04/22/15 1442  BP: 133/76  Pulse: 78  Temp: 97.3 F (36.3 C)  Resp: 20  Weight: 170 lb (77.111 kg)    Physical Exam  Constitutional: He appears well-developed and well-nourished. No distress.  HENT:  Head: Normocephalic and atraumatic.  Neck: Normal range of motion. Neck supple.  Cardiovascular: Normal rate, regular rhythm and normal heart sounds.   Pulmonary/Chest: Effort normal and breath sounds normal.  Abdominal: Soft. Bowel sounds are normal.  Musculoskeletal: He exhibits no edema or tenderness.  Neurological: He is alert.  Skin: Skin is warm and dry. He is not diaphoretic.  Psychiatric: He has a normal mood and affect.    Labs reviewed: Basic Metabolic Panel: No results for input(s): NA, K, CL,  CO2, GLUCOSE, BUN, CREATININE, CALCIUM, MG, PHOS in the last 8760 hours. Liver Function Tests: No results for input(s): AST, ALT, ALKPHOS, BILITOT, PROT, ALBUMIN in the last 8760 hours. No results for input(s): LIPASE, AMYLASE in the last 8760 hours. No results for input(s): AMMONIA in the last 8760 hours. CBC: No results for input(s): WBC, NEUTROABS, HGB, HCT, MCV, PLT in the last 8760 hours. TSH: No results for input(s): TSH in the last 8760 hours. A1C: No results found for: HGBA1C Lipid Panel: No results for input(s): CHOL, HDL, LDLCALC, TRIG, CHOLHDL, LDLDIRECT in the last 8760 hours. CBC with Diff    Result: 05/04/2014 5:12 PM   ( Status: F )       WBC 5.1     4.0-10.5 K/uL SLN   RBC 3.33   L 4.22-5.81 MIL/uL SLN   Hemoglobin 8.3   L 13.0-17.0 g/dL SLN   Hematocrit 27.1   L 39.0-52.0 % SLN   MCV 81.4     78.0-100.0 fL SLN   MCH 24.9   L 26.0-34.0 pg SLN   MCHC 30.6     30.0-36.0 g/dL SLN   RDW 16.1   H 11.5-15.5 % SLN   Platelet Count 217     150-400 K/uL SLN   Granulocyte % 39   L 43-77 % SLN   Absolute Gran 2.0     1.7-7.7 K/uL SLN   Lymph % 43     12-46 % SLN   Absolute Lymph 2.2     0.7-4.0 K/uL SLN   Mono % 12     3-12 % SLN   Absolute Mono 0.6     0.1-1.0 K/uL SLN   Eos % 5     0-5 % SLN   Absolute Eos 0.3     0.0-0.7 K/uL SLN   Baso % 1     0-1 % SLN   Absolute Baso 0.1     0.0-0.1 K/uL SLN   Smear Review Criteria for review not met  SLN   Basic Metabolic Panel    Result: 05/04/2014 4:32 PM   ( Status: F )       Sodium 139     135-145 mEq/L SLN   Potassium 5.7   H 3.5-5.3 mEq/L SLN C Chloride 111     96-112 mEq/L SLN   CO2 21     19-32 mEq/L SLN   Glucose 84     70-99 mg/dL SLN  BUN 38   H 6-23 mg/dL SLN   Creatinine 2.53   H 0.50-1.35 mg/dL SLN   Calcium 8.1   L 8.4-10.5 mg/dL SLN   Iron    Result: 05/04/2014 4:32 PM   ( Status: F )       Iron 62     42-165 ug/dL SLN   TIBC    Result: 05/04/2014 4:32 PM   ( Status: F )       UIBC 184      125-400 ug/dL SLN   TIBC 246     215-435 ug/dL SLN   %SAT 25     20-55 % SLN   Ferritin    Result: 05/04/2014 3:38 PM   ( Status: F )       Ferritin 43     22-322 ng/mL SLN   Transferrin    Result: 05/04/2014 7:21 PM   ( Status: F )       Transferrin 197   L 200-360 mg/dL SLN Basic Metabolic Panel    Result: 05/08/2014 4:30 PM   ( Status: F )       Sodium 140     135-145 mEq/L SLN   Potassium 5.3     3.5-5.3 mEq/L SLN   Chloride 112     96-112 mEq/L SLN   CO2 21     19-32 mEq/L SLN   Glucose 103   H 70-99 mg/dL SLN   BUN 41   H 6-23 mg/dL SLN   Creatinine 2.76   H 0.50-1.35 mg/dL SLN   Calcium 7.9   L 8.4-10.5 mg/dL SLN  CBC with Diff    Result: 08/30/2014 12:06 PM   ( Status: F )     C WBC 9.8     4.0-10.5 K/uL SLN   RBC 3.86   L 4.22-5.81 MIL/uL SLN   Hemoglobin 11.4   L 13.0-17.0 g/dL SLN   Hematocrit 35.1   L 39.0-52.0 % SLN   MCV 90.9     78.0-100.0 fL SLN   MCH 29.5     26.0-34.0 pg SLN   MCHC 32.5     30.0-36.0 g/dL SLN   RDW 15.9   H 11.5-15.5 % SLN   Platelet Count 168     150-400 K/uL SLN   MPV 10.9     8.6-12.4 fL SLN   Granulocyte % 79   H 43-77 % SLN   Absolute Gran 7.7     1.7-7.7 K/uL SLN   Lymph % 11   L 12-46 % SLN   Absolute Lymph 1.1     0.7-4.0 K/uL SLN   Mono % 9     3-12 % SLN   Absolute Mono 0.9     0.1-1.0 K/uL SLN   Eos % 1     0-5 % SLN   Absolute Eos 0.1     0.0-0.7 K/uL SLN   Baso % 0     0-1 % SLN   Absolute Baso 0.0     0.0-0.1 K/uL SLN   Smear Review Criteria for review not met  Iron and IBC    Result: 11/22/2014 6:17 PM   ( Status: F )       Iron 47     42-165 ug/dL SLN   UIBC 140     125-400 ug/dL SLN   TIBC 187   L 215-435 ug/dL SLN   %SAT 25     20-55 % SLN   BMP with  Estimated GFR    Result: 11/22/2014 6:17 PM   ( Status: F )       Sodium 141     135-145 mEq/L SLN   Potassium 5.1     3.5-5.3 mEq/L SLN   Chloride 111     96-112 mEq/L SLN   CO2 24     19-32 mEq/L SLN   Glucose 76     70-99 mg/dL SLN   BUN 45   H 6-23 mg/dL SLN    Creatinine 2.57   H 0.50-1.35 mg/dL SLN   Calcium 8.1   L 8.4-10.5 mg/dL SLN   Est GFR, African American 25   L  mL/min SLN   Est GFR, NonAfrican American 22   L  mL/min SLN C CBC with Diff    Result: 11/22/2014 5:36 PM   ( Status: F )       WBC 5.2     4.0-10.5 K/uL SLN   RBC 3.89   L 4.22-5.81 MIL/uL SLN   Hemoglobin 11.9   L 13.0-17.0 g/dL SLN   Hematocrit 37.0   L 39.0-52.0 % SLN   MCV 95.1     78.0-100.0 fL SLN   MCH 30.6     26.0-34.0 pg SLN   MCHC 32.2     30.0-36.0 g/dL SLN   RDW 15.1     11.5-15.5 % SLN   Platelet Count 168     150-400 K/uL SLN   MPV 12.0     8.6-12.4 fL SLN   Granulocyte % 46     43-77 % SLN   Absolute Gran 2.4     1.7-7.7 K/uL SLN   Lymph % 32     12-46 % SLN   Absolute Lymph 1.7     0.7-4.0 K/uL SLN   Mono % 14   H 3-12 % SLN   Absolute Mono 0.7     0.1-1.0 K/uL SLN   Eos % 7   H 0-5 % SLN   Absolute Eos 0.4     0.0-0.7 K/uL SLN   Baso % 1     0-1 % SLN   Absolute Baso 0.1 Assessment/Plan  1. Osteoarthritis of knee, unspecified laterality, unspecified osteoarthritis type -stable, pain controlled on current regimen  2. CKD (chronic kidney disease) stage 3, GFR 30-59 ml/min -will follow up BMP, conts on bicarb twice daily  3. Anemia, iron deficiency -stable, will decrease iron to once daily, follow up cbc in 1 month  4. Late effects of CVA (cerebrovascular accident) -stable, conts on ASA daily  5. Other constipation -stable on current regimen  6. Allergic rhinitis, unspecified allergic rhinitis type flonase has been decreased to 1 spray to each nare daily and pt tolerating well.  -conts on zyrtec daily  Chrissie Dacquisto K. Harle Battiest  [facility] [phone] 8 am - 5 pm) [phone] (after hours)

## 2015-05-23 LAB — CBC AND DIFFERENTIAL
HCT: 37 % — AB (ref 41–53)
Hemoglobin: 11.7 g/dL — AB (ref 13.5–17.5)
Platelets: 167 10*3/uL (ref 150–399)
WBC: 5.4 10^3/mL

## 2015-05-23 LAB — BASIC METABOLIC PANEL
BUN: 38 mg/dL — AB (ref 4–21)
CREATININE: 2.7 mg/dL — AB (ref 0.6–1.3)
Glucose: 75 mg/dL
Potassium: 5.3 mmol/L (ref 3.4–5.3)
SODIUM: 141 mmol/L (ref 137–147)

## 2015-05-26 ENCOUNTER — Encounter: Payer: Self-pay | Admitting: Internal Medicine

## 2015-05-26 ENCOUNTER — Non-Acute Institutional Stay (SKILLED_NURSING_FACILITY): Payer: Medicare Other | Admitting: Internal Medicine

## 2015-05-26 DIAGNOSIS — I10 Essential (primary) hypertension: Secondary | ICD-10-CM | POA: Diagnosis not present

## 2015-05-26 DIAGNOSIS — M179 Osteoarthritis of knee, unspecified: Secondary | ICD-10-CM | POA: Diagnosis not present

## 2015-05-26 DIAGNOSIS — M25562 Pain in left knee: Secondary | ICD-10-CM

## 2015-05-26 DIAGNOSIS — I509 Heart failure, unspecified: Secondary | ICD-10-CM | POA: Diagnosis not present

## 2015-05-26 DIAGNOSIS — J309 Allergic rhinitis, unspecified: Secondary | ICD-10-CM

## 2015-05-26 DIAGNOSIS — F1010 Alcohol abuse, uncomplicated: Secondary | ICD-10-CM

## 2015-05-26 DIAGNOSIS — H9193 Unspecified hearing loss, bilateral: Secondary | ICD-10-CM

## 2015-05-26 DIAGNOSIS — F1011 Alcohol abuse, in remission: Secondary | ICD-10-CM

## 2015-05-26 DIAGNOSIS — N183 Chronic kidney disease, stage 3 unspecified: Secondary | ICD-10-CM

## 2015-05-26 DIAGNOSIS — M25561 Pain in right knee: Secondary | ICD-10-CM

## 2015-05-26 DIAGNOSIS — F0390 Unspecified dementia without behavioral disturbance: Secondary | ICD-10-CM

## 2015-05-26 DIAGNOSIS — G8929 Other chronic pain: Secondary | ICD-10-CM

## 2015-05-26 DIAGNOSIS — M1710 Unilateral primary osteoarthritis, unspecified knee: Secondary | ICD-10-CM

## 2015-05-26 NOTE — Progress Notes (Signed)
Patient ID: Elijah Ramos, male   DOB: 11/14/1925, 81 y.o.   MRN: [id number]    DATE: 05/26/15  Location:  [facility]    Place of Service: SNF (31)   Extended Emergency Contact Information Primary Emergency Contact: [name],[name] Address: [address]          [place], [place] [address] [place] Home Phone: [phone] Mobile Phone: [phone] Relation: Son Secondary Emergency Contact: [name],[name]  [place] Mobile Phone: [phone] Relation: Other  Advanced Directive information Does patient have an advance directive?: Yes, Type of Advance Directive: Out of facility DNR (pink MOST or yellow form), Pre-existing out of facility DNR order (yellow form or pink MOST form): Pink MOST form placed in chart (order not valid for inpatient use)  Chief Complaint  Patient presents with  . Medical Management of Chronic Issues    HPI:  81 yo male long term resident seen today for f/u. He c/o right knee pain and rhinorrhea. He is a poor historian due to dementia. He is deaf and communicates with writing on an eraser board.  Past Medical History  Diagnosis Date  . Protein calorie malnutrition (HCC)   . Dysphagia   . CHF (congestive heart failure) (HCC)   . CKD (chronic kidney disease)   . Dementia without behavioral disturbance   . Hypertension   . Personal history of DVT (deep vein thrombosis)   . H/O: alcohol abuse   . Stroke (HCC)   . PNA (pneumonia)     RLL  . H/O acute respiratory failure   . Deafness, bilateral     reads lips  . Hyperkalemia   . Bilateral chronic knee pain   . Late effects of CVA (cerebrovascular accident)     History reviewed. No pertinent past surgical history.  No care team member to display  Social History   Social History  . Marital Status: Single    Spouse Name: N/A  . Number of Children: N/A  . Years of Education: N/A   Occupational History  . Not on file.   Social History Main  Topics  . Smoking status: Unknown If Ever Smoked  . Smokeless tobacco: Not on file  . Alcohol Use: Yes     Comment: former abuse  . Drug Use: Not on file  . Sexual Activity: No   Other Topics Concern  . Not on file   Social History Narrative     reports that he drinks alcohol. His tobacco and drug histories are not on file.   There is no immunization history on file for this patient.  Allergies  Allergen Reactions  . Aleve [Naproxen Sodium]   . Beta Adrenergic Blockers   . Penicillins   . Sulfa Antibiotics     Medications: Patient's Medications  New Prescriptions   No medications on file  Previous Medications   ACIDOPHILUS (RISAQUAD) CAPS CAPSULE    Take 1 capsule by mouth 2 (two) times daily. For digestive health   ASPIRIN 81 MG TABLET    Take 81 mg by mouth daily. For hx cva   ATORVASTATIN (LIPITOR) 10 MG TABLET    Take 5 mg by mouth daily. For CHF   CETIRIZINE (ZYRTEC ALLERGY) 10 MG TABLET    Take 10 mg by mouth at bedtime. For allergies   DOCUSATE SODIUM (COLACE) 100 MG CAPSULE    Take 100 mg by mouth daily. For constipation   DONEPEZIL (ARICEPT) 10 MG TABLET  Take 10 mg by mouth at bedtime. For dementia   FERROUS SULFATE 325 (65 FE) MG TABLET    Take 325 mg by mouth daily. (DO NOT CRUSH)   FLUTICASONE (FLONASE) 50 MCG/ACT NASAL SPRAY    Place 2 sprays into both nostrils daily.    FOLIC ACID (FOLVITE) 1 MG TABLET    Take 1 mg by mouth daily.   MAGNESIUM HYDROXIDE (MILK OF MAGNESIA PO)    If no BM in 3 days, give 30 cc of milk of magnesium by mouth x 1 dose in 24 hour as needed   MEMANTINE (NAMENDA XR) 14 MG CP24 24 HR CAPSULE    Take 14 mg by mouth daily.   MULTIPLE MINERALS-VITAMINS PO    1 tablet by mouth for wound healing   NUTRITIONAL SUPPLEMENTS (ENSURE CLEAR) LIQD    Give 200 ml by mouth twice daily ( JUICE)   OMEPRAZOLE (PRILOSEC) 40 MG CAPSULE    Take 40 mg by mouth daily.   OXYCODONE (OXY-IR) 5 MG CAPSULE    Take 5 mg by mouth every 6 (six) hours as  needed. For moderate to severe pain   POLYETHYLENE GLYCOL (MIRALAX / GLYCOLAX) PACKET    Take 17 g by mouth daily. For bowel health   SODIUM BICARBONATE 650 MG TABLET    Take 650 mg by mouth 2 (two) times daily. For chronic kidney disease   THIAMINE 100 MG TABLET    Take 100 mg by mouth daily. For supplement  Modified Medications   No medications on file  Discontinued Medications   No medications on file    Review of Systems  Unable to perform ROS: Dementia    Filed Vitals:   05/26/15 1457  BP: 126/78  Pulse: 56  Resp: 20   There is no weight on file to calculate BMI.  Physical Exam  Constitutional: He appears well-developed.  Frail appearing in NAD. He is deaf and uses eraser board to communicate  HENT:  Mouth/Throat: Oropharynx is clear and moist.  Eyes: Pupils are equal, round, and reactive to light. No scleral icterus.  Neck: Neck supple. Carotid bruit is not present. No thyromegaly present.  Cardiovascular: Normal rate, regular rhythm and intact distal pulses.  Exam reveals no gallop and no friction rub.   Murmur (1/6 SEM) heard. no distal LE swelling. No calf TTP  Pulmonary/Chest: Effort normal and breath sounds normal. He has no wheezes. He has no rales. He exhibits no tenderness.  Abdominal: Soft. Bowel sounds are normal. He exhibits no distension, no abdominal bruit, no pulsatile midline mass and no mass. There is no tenderness. There is no rebound and no guarding.  Musculoskeletal: He exhibits edema and tenderness.  Right medial knee bony deformity with swelling, TTP and reduced full extension.  Lymphadenopathy:    He has no cervical adenopathy.  Neurological: He is alert.  Skin: Skin is warm and dry. No rash noted.  Psychiatric: He has a normal mood and affect. His behavior is normal.     Labs reviewed: No visits with results within 3 Month(s) from this visit. Latest known visit with results is:  Admission on 10/26/2013, Discharged on 11/16/2013  No results  displayed because visit has over 200 results.      No results found.   Assessment/Plan  Check CMP  Cont current pain control  Cont current meds as ordered  Michaline Kindig S. [facility] [address] [place], [place] [address] [phone] Cell (Monday-Friday 8 AM - 5 PM) [phone] After 5 PM and follow prompts  

## 2015-07-22 ENCOUNTER — Non-Acute Institutional Stay (SKILLED_NURSING_FACILITY): Payer: Medicare Other | Admitting: Nurse Practitioner

## 2015-07-22 DIAGNOSIS — M179 Osteoarthritis of knee, unspecified: Secondary | ICD-10-CM

## 2015-07-22 DIAGNOSIS — J069 Acute upper respiratory infection, unspecified: Secondary | ICD-10-CM

## 2015-07-22 DIAGNOSIS — N183 Chronic kidney disease, stage 3 unspecified: Secondary | ICD-10-CM

## 2015-07-22 DIAGNOSIS — M1710 Unilateral primary osteoarthritis, unspecified knee: Secondary | ICD-10-CM

## 2015-07-22 DIAGNOSIS — J309 Allergic rhinitis, unspecified: Secondary | ICD-10-CM

## 2015-07-22 DIAGNOSIS — D509 Iron deficiency anemia, unspecified: Secondary | ICD-10-CM | POA: Diagnosis not present

## 2015-07-22 NOTE — Progress Notes (Signed)
Patient ID: Elijah Ramos, male   DOB: 1925-12-27, 82 y.o.   MRN: [id number]    Nursing Home Location:  [place] of Service: SNF (31)  PCP: No primary care provider on file.  Allergies  Allergen Reactions  . Aleve [Naproxen Sodium]   . Beta Adrenergic Blockers   . Penicillins   . Sulfa Antibiotics     Chief Complaint  Patient presents with  . Medical Management of Chronic Issues    HPI:  Patient is a 82 y.o. male seen today at [facility] for routine follow up on chronic conditions. Pt with a pmh of CKD, deafness, dementia, dysphagia, CVA, hx of DVT. Pt is very HOH making ROS difficult to obtain. Pt has been doing well in the last month however in the last few days has increase cough and congestion. No shortness of breath or temperature. Overall feels well.   Review of Systems:  Review of Systems  Unable to perform ROS pt is severely HOH unable to perform ROS   Past Medical History  Diagnosis Date  . Protein calorie malnutrition ([place])   . Dysphagia   . CHF (congestive heart failure) ([place])   . CKD (chronic kidney disease)   . Dementia without behavioral disturbance   . Hypertension   . Personal history of DVT (deep vein thrombosis)   . H/O: alcohol abuse   . Stroke ([place])   . PNA (pneumonia)     RLL  . H/O acute respiratory failure   . Deafness, bilateral     reads lips  . Hyperkalemia   . Bilateral chronic knee pain   . Late effects of CVA (cerebrovascular accident)    No past surgical history on file. Social History:   reports that he drinks alcohol. His tobacco and drug histories are not on file.  No family history on file.  Medications: Patient's Medications  New Prescriptions   No medications on file  Previous Medications   ACIDOPHILUS (RISAQUAD) CAPS CAPSULE    Take 1 capsule by mouth 2 (two) times daily. For digestive health   ASPIRIN 81 MG TABLET    Take 81 mg by mouth daily. For hx cva   ATORVASTATIN  (LIPITOR) 10 MG TABLET    Take 5 mg by mouth daily. For CHF   CETIRIZINE (ZYRTEC ALLERGY) 10 MG TABLET    Take 10 mg by mouth at bedtime. For allergies   DOCUSATE SODIUM (COLACE) 100 MG CAPSULE    Take 100 mg by mouth daily. For constipation   DONEPEZIL (ARICEPT) 10 MG TABLET    Take 10 mg by mouth at bedtime. For dementia   FERROUS SULFATE 325 (65 FE) MG TABLET    Take 325 mg by mouth daily. (DO NOT CRUSH)   FLUTICASONE (FLONASE) 50 MCG/ACT NASAL SPRAY    Place 2 sprays into both nostrils daily.    FOLIC ACID (FOLVITE) 1 MG TABLET    Take 1 mg by mouth daily.   MAGNESIUM HYDROXIDE (MILK OF MAGNESIA PO)    If no BM in 3 days, give 30 cc of milk of magnesium by mouth x 1 dose in 24 hour as needed   MEMANTINE (NAMENDA XR) 14 MG CP24 24 HR CAPSULE    Take 14 mg by mouth daily.   MULTIPLE MINERALS-VITAMINS PO    1 tablet by mouth for wound healing   NUTRITIONAL SUPPLEMENTS (ENSURE CLEAR) LIQD    Give 200 ml by mouth twice  daily ( JUICE)   OMEPRAZOLE (PRILOSEC) 40 MG CAPSULE    Take 10 mg by mouth daily.    OXYCODONE (OXY-IR) 5 MG CAPSULE    Take 5 mg by mouth every 6 (six) hours as needed. For moderate to severe pain   POLYETHYLENE GLYCOL (MIRALAX / GLYCOLAX) PACKET    Take 17 g by mouth daily. For bowel health   SODIUM BICARBONATE 650 MG TABLET    Take 650 mg by mouth 2 (two) times daily. For chronic kidney disease   THIAMINE 100 MG TABLET    Take 100 mg by mouth daily. For supplement  Modified Medications   No medications on file  Discontinued Medications   No medications on file     Physical Exam: Filed Vitals:   07/22/15 1515  BP: 125/96  Pulse: 78  Temp: 98 F (36.7 C)  Resp: 20  Weight: 169 lb (76.658 kg)    Physical Exam  Constitutional: He appears well-developed and well-nourished. No distress.  HENT:  Head: Normocephalic and atraumatic.  Neck: Normal range of motion. Neck supple.  Cardiovascular: Normal rate, regular rhythm and normal heart sounds.   Pulmonary/Chest:  Effort normal and breath sounds normal.  Abdominal: Soft. Bowel sounds are normal.  Musculoskeletal: He exhibits no edema or tenderness.  Neurological: He is alert.  Skin: Skin is warm and dry. He is not diaphoretic.  Psychiatric: He has a normal mood and affect.    Labs reviewed: Basic Metabolic Panel:  Recent Labs  05/23/15  NA 141  K 5.3  BUN 38*  CREATININE 2.7*   Liver Function Tests: No results for input(s): AST, ALT, ALKPHOS, BILITOT, PROT, ALBUMIN in the last 8760 hours. No results for input(s): LIPASE, AMYLASE in the last 8760 hours. No results for input(s): AMMONIA in the last 8760 hours. CBC:  Recent Labs  05/23/15  WBC 5.4  HGB 11.7*  HCT 37*  PLT 167   TSH: No results for input(s): TSH in the last 8760 hours. A1C: No results found for: HGBA1C Lipid Panel: No results for input(s): CHOL, HDL, LDLCALC, TRIG, CHOLHDL, LDLDIRECT in the last 8760 hours. CBC with Diff    Result: 05/04/2014 5:12 PM   ( Status: F )       WBC 5.1     4.0-10.5 K/uL SLN   RBC 3.33   L 4.22-5.81 MIL/uL SLN   Hemoglobin 8.3   L 13.0-17.0 g/dL SLN   Hematocrit 27.1   L 39.0-52.0 % SLN   MCV 81.4     78.0-100.0 fL SLN   MCH 24.9   L 26.0-34.0 pg SLN   MCHC 30.6     30.0-36.0 g/dL SLN   RDW 16.1   H 11.5-15.5 % SLN   Platelet Count 217     150-400 K/uL SLN   Granulocyte % 39   L 43-77 % SLN   Absolute Gran 2.0     1.7-7.7 K/uL SLN   Lymph % 43     12-46 % SLN   Absolute Lymph 2.2     0.7-4.0 K/uL SLN   Mono % 12     3-12 % SLN   Absolute Mono 0.6     0.1-1.0 K/uL SLN   Eos % 5     0-5 % SLN   Absolute Eos 0.3     0.0-0.7 K/uL SLN   Baso % 1     0-1 % SLN   Absolute Baso 0.1     0.0-0.1 K/uL SLN  Smear Review Criteria for review not met  SLN   Basic Metabolic Panel    Result: 05/04/2014 4:32 PM   ( Status: F )       Sodium 139     135-145 mEq/L SLN   Potassium 5.7   H 3.5-5.3 mEq/L SLN C Chloride 111     96-112 mEq/L SLN   CO2 21     19-32 mEq/L SLN   Glucose 84      70-99 mg/dL SLN   BUN 38   H 6-23 mg/dL SLN   Creatinine 2.53   H 0.50-1.35 mg/dL SLN   Calcium 8.1   L 8.4-10.5 mg/dL SLN   Iron    Result: 05/04/2014 4:32 PM   ( Status: F )       Iron 62     42-165 ug/dL SLN   TIBC    Result: 05/04/2014 4:32 PM   ( Status: F )       UIBC 184     125-400 ug/dL SLN   TIBC 246     215-435 ug/dL SLN   %SAT 25     20-55 % SLN   Ferritin    Result: 05/04/2014 3:38 PM   ( Status: F )       Ferritin 43     22-322 ng/mL SLN   Transferrin    Result: 05/04/2014 7:21 PM   ( Status: F )       Transferrin 197   L 200-360 mg/dL SLN Basic Metabolic Panel    Result: 05/08/2014 4:30 PM   ( Status: F )       Sodium 140     135-145 mEq/L SLN   Potassium 5.3     3.5-5.3 mEq/L SLN   Chloride 112     96-112 mEq/L SLN   CO2 21     19-32 mEq/L SLN   Glucose 103   H 70-99 mg/dL SLN   BUN 41   H 6-23 mg/dL SLN   Creatinine 2.76   H 0.50-1.35 mg/dL SLN   Calcium 7.9   L 8.4-10.5 mg/dL SLN  CBC with Diff    Result: 08/30/2014 12:06 PM   ( Status: F )     C WBC 9.8     4.0-10.5 K/uL SLN   RBC 3.86   L 4.22-5.81 MIL/uL SLN   Hemoglobin 11.4   L 13.0-17.0 g/dL SLN   Hematocrit 35.1   L 39.0-52.0 % SLN   MCV 90.9     78.0-100.0 fL SLN   MCH 29.5     26.0-34.0 pg SLN   MCHC 32.5     30.0-36.0 g/dL SLN   RDW 15.9   H 11.5-15.5 % SLN   Platelet Count 168     150-400 K/uL SLN   MPV 10.9     8.6-12.4 fL SLN   Granulocyte % 79   H 43-77 % SLN   Absolute Gran 7.7     1.7-7.7 K/uL SLN   Lymph % 11   L 12-46 % SLN   Absolute Lymph 1.1     0.7-4.0 K/uL SLN   Mono % 9     3-12 % SLN   Absolute Mono 0.9     0.1-1.0 K/uL SLN   Eos % 1     0-5 % SLN   Absolute Eos 0.1     0.0-0.7 K/uL SLN   Baso % 0     0-1 % SLN   Absolute  Baso 0.0     0.0-0.1 K/uL SLN   Smear Review Criteria for review not met  Iron and IBC    Result: 11/22/2014 6:17 PM   ( Status: F )       Iron 47     42-165 ug/dL SLN   UIBC 140     125-400 ug/dL SLN   TIBC 187   L 215-435 ug/dL SLN   %SAT 25      20-55 % SLN   BMP with Estimated GFR    Result: 11/22/2014 6:17 PM   ( Status: F )       Sodium 141     135-145 mEq/L SLN   Potassium 5.1     3.5-5.3 mEq/L SLN   Chloride 111     96-112 mEq/L SLN   CO2 24     19-32 mEq/L SLN   Glucose 76     70-99 mg/dL SLN   BUN 45   H 6-23 mg/dL SLN   Creatinine 2.57   H 0.50-1.35 mg/dL SLN   Calcium 8.1   L 8.4-10.5 mg/dL SLN   Est GFR, African American 25   L  mL/min SLN   Est GFR, NonAfrican American 22   L  mL/min SLN C CBC with Diff    Result: 11/22/2014 5:36 PM   ( Status: F )       WBC 5.2     4.0-10.5 K/uL SLN   RBC 3.89   L 4.22-5.81 MIL/uL SLN   Hemoglobin 11.9   L 13.0-17.0 g/dL SLN   Hematocrit 37.0   L 39.0-52.0 % SLN   MCV 95.1     78.0-100.0 fL SLN   MCH 30.6     26.0-34.0 pg SLN   MCHC 32.2     30.0-36.0 g/dL SLN   RDW 15.1     11.5-15.5 % SLN   Platelet Count 168     150-400 K/uL SLN   MPV 12.0     8.6-12.4 fL SLN   Granulocyte % 46     43-77 % SLN   Absolute Gran 2.4     1.7-7.7 K/uL SLN   Lymph % 32     12-46 % SLN   Absolute Lymph 1.7     0.7-4.0 K/uL SLN   Mono % 14   H 3-12 % SLN   Absolute Mono 0.7     0.1-1.0 K/uL SLN   Eos % 7   H 0-5 % SLN   Absolute Eos 0.4     0.0-0.7 K/uL SLN   Baso % 1     0-1 % SLN   Absolute Baso 0.1 Assessment/Plan  1. CKD (chronic kidney disease) stage 3, GFR 30-59 ml/min Potassium slightly elevated on recent lab, will follow up BMP in AM to monitor, renal function remains stable.   2. Osteoarthritis of knee, unspecified laterality, unspecified osteoarthritis type Stable, without worsening of knee pain, cont current regimen  3. Anemia, iron deficiency Cbc remains stable, will DC iron at this time.  4. Allergic rhinitis, unspecified allergic rhinitis type Reports increase in nasal drainage however could be due to viral infection, will monitor for now.   5. Upper respiratory infection with cough and congestion Increase nasal drainage with cough and congestion, pt remains baseline  activities, eating and drinking well. Will start mucinex DM BID for 7 days, VS q shift for 3 days. Encourage hydration.    Carlos American. [place], [facility] [phone] 8  am - 5 pm) [phone] (after hours)

## 2015-08-26 ENCOUNTER — Non-Acute Institutional Stay (SKILLED_NURSING_FACILITY): Payer: Medicare Other | Admitting: Nurse Practitioner

## 2015-08-26 DIAGNOSIS — G8929 Other chronic pain: Secondary | ICD-10-CM

## 2015-08-26 DIAGNOSIS — E785 Hyperlipidemia, unspecified: Secondary | ICD-10-CM

## 2015-08-26 DIAGNOSIS — D509 Iron deficiency anemia, unspecified: Secondary | ICD-10-CM

## 2015-08-26 DIAGNOSIS — F0390 Unspecified dementia without behavioral disturbance: Secondary | ICD-10-CM

## 2015-08-26 DIAGNOSIS — N183 Chronic kidney disease, stage 3 unspecified: Secondary | ICD-10-CM

## 2015-08-26 DIAGNOSIS — M25562 Pain in left knee: Secondary | ICD-10-CM | POA: Diagnosis not present

## 2015-08-26 DIAGNOSIS — M25561 Pain in right knee: Secondary | ICD-10-CM | POA: Diagnosis not present

## 2015-08-26 NOTE — Progress Notes (Signed)
Patient ID: Elijah Ramos, male   DOB: 09/10/1925, 82 y.o.   MRN: [id number]    Nursing Home Location:  [facility]   Place of Service: SNF (31)  PCP: No primary care provider on file.  Allergies  Allergen Reactions  . Aleve [Naproxen Sodium]   . Beta Adrenergic Blockers   . Penicillins   . Sulfa Antibiotics     Chief Complaint  Patient presents with  . Medical Management of Chronic Issues    HPI:  Patient is a 82 y.o. male seen today at [facility] for routine follow up on chronic conditions. Pt with a pmh of CKD, deafness, dementia, dysphagia, CVA, hx of DVT. Pt is very HOH making ROS difficult to obtain. No signs of worsening pain. Staff reports bowels moving well. Pt with good appetite. No behaviors or acute decline in cognitive status. No shortness of breath or chest pains reported.  Staff without acute concerns. Pt appears to be doing well.   Review of Systems:  Review of Systems  Unable to perform ROS pt is severely HOH unable to perform ROS   Past Medical History  Diagnosis Date  . Protein calorie malnutrition ([facility])   . Dysphagia   . CHF (congestive heart failure) ([facility])   . CKD (chronic kidney disease)   . Dementia without behavioral disturbance   . Hypertension   . Personal history of DVT (deep vein thrombosis)   . H/O: alcohol abuse   . Stroke ([facility])   . PNA (pneumonia)     RLL  . H/O acute respiratory failure   . Deafness, bilateral     reads lips  . Hyperkalemia   . Bilateral chronic knee pain   . Late effects of CVA (cerebrovascular accident)    No past surgical history on file. Social History:   reports that he drinks alcohol. His tobacco and drug histories are not on file.  No family history on file.  Medications: Patient's Medications  New Prescriptions   No medications on file  Previous Medications   ACIDOPHILUS (RISAQUAD) CAPS CAPSULE    Take 1 capsule by mouth 2 (two) times daily. For digestive health   ASPIRIN 81 MG TABLET    Take 81 mg by mouth daily. For hx cva   ATORVASTATIN (LIPITOR) 10 MG TABLET    Take 5 mg by mouth daily. For CHF   CETIRIZINE (ZYRTEC ALLERGY) 10 MG TABLET    Take 10 mg by mouth at bedtime. For allergies   DOCUSATE SODIUM (COLACE) 100 MG CAPSULE    Take 100 mg by mouth daily. For constipation   DONEPEZIL (ARICEPT) 10 MG TABLET    Take 10 mg by mouth at bedtime. For dementia   FLUTICASONE (FLONASE) 50 MCG/ACT NASAL SPRAY    Place 2 sprays into both nostrils daily.    FOLIC ACID (FOLVITE) 1 MG TABLET    Take 1 mg by mouth daily.   MAGNESIUM HYDROXIDE (MILK OF MAGNESIA PO)    If no BM in 3 days, give 30 cc of milk of magnesium by mouth x 1 dose in 24 hour as needed   MEMANTINE (NAMENDA XR) 14 MG CP24 24 HR CAPSULE    Take 14 mg by mouth daily.   MULTIPLE MINERALS-VITAMINS PO    1 tablet by mouth for wound healing   NUTRITIONAL SUPPLEMENTS (ENSURE CLEAR) LIQD    Give 200 ml by mouth twice daily ( JUICE)   OXYCODONE (OXY-IR) 5 MG CAPSULE  Take 5 mg by mouth every 6 (six) hours as needed. For moderate to severe pain   POLYETHYLENE GLYCOL (MIRALAX / GLYCOLAX) PACKET    Take 17 g by mouth daily. For bowel health   SODIUM BICARBONATE 650 MG TABLET    Take 650 mg by mouth 2 (two) times daily. For chronic kidney disease   THIAMINE 100 MG TABLET    Take 100 mg by mouth daily. For supplement  Modified Medications   No medications on file  Discontinued Medications   FERROUS SULFATE 325 (65 FE) MG TABLET    Take 325 mg by mouth daily. (DO NOT CRUSH)   OMEPRAZOLE (PRILOSEC) 40 MG CAPSULE    Take 10 mg by mouth daily.      Physical Exam: Filed Vitals:   08/26/15 1553  BP: 144/68  Pulse: 80  Temp: 98 F (36.7 C)  Resp: 20  Weight: 167 lb (75.751 kg)    Physical Exam  Constitutional: He appears well-developed and well-nourished. No distress.  HENT:  Head: Normocephalic and atraumatic.  Neck: Normal range of motion. Neck supple.  Cardiovascular: Normal rate, regular  rhythm and normal heart sounds.   Pulmonary/Chest: Effort normal and breath sounds normal.  Abdominal: Soft. Bowel sounds are normal.  Musculoskeletal: He exhibits no edema or tenderness.  Neurological: He is alert.  Skin: Skin is warm and dry. He is not diaphoretic.  Psychiatric: He has a normal mood and affect.    Labs reviewed: Basic Metabolic Panel:  Recent Labs  27/25/36  NA 141  K 5.3  BUN 38*  CREATININE 2.7*   Liver Function Tests: No results for input(s): AST, ALT, ALKPHOS, BILITOT, PROT, ALBUMIN in the last 8760 hours. No results for input(s): LIPASE, AMYLASE in the last 8760 hours. No results for input(s): AMMONIA in the last 8760 hours. CBC:  Recent Labs  05/23/15  WBC 5.4  HGB 11.7*  HCT 37*  PLT 167   TSH: No results for input(s): TSH in the last 8760 hours. A1C: No results found for: HGBA1C Lipid Panel: No results for input(s): CHOL, HDL, LDLCALC, TRIG, CHOLHDL, LDLDIRECT in the last 8760 hours.   Assessment/Plan 1. Anemia, iron deficiency Off supplements, will follow up CBC at this time  2. Bilateral chronic knee pain -pain controlled, no signs of worsening symptoms  3. CKD (chronic kidney disease) stage 3, GFR 30-59 ml/min conts on bicarb, will follow up CMP at this time  4. Dementia without behavioral disturbance Stable, conts on aricept and namenda, no behaviors noted by staff.   5. Hyperlipidemia -conts on lipitor, will follow up fasting lipids.      Janene Harvey. Biagio Borg  [facility] [phone] 8 am - 5 pm) [phone] (after hours)

## 2015-08-27 DIAGNOSIS — E785 Hyperlipidemia, unspecified: Secondary | ICD-10-CM | POA: Insufficient documentation

## 2015-08-29 LAB — BASIC METABOLIC PANEL
BUN: 40 mg/dL — AB (ref 4–21)
CREATININE: 3 mg/dL — AB (ref 0.6–1.3)
Glucose: 129 mg/dL
Potassium: 4.8 mmol/L (ref 3.4–5.3)
Sodium: 141 mmol/L (ref 137–147)

## 2015-08-29 LAB — CBC AND DIFFERENTIAL
HEMATOCRIT: 38 % — AB (ref 41–53)
HEMOGLOBIN: 12.1 g/dL — AB (ref 13.5–17.5)
Platelets: 249 10*3/uL (ref 150–399)
WBC: 6 10^3/mL

## 2015-08-29 LAB — LIPID PANEL
CHOLESTEROL: 197 mg/dL (ref 0–200)
HDL: 5 mg/dL — AB (ref 35–70)
LDL Cholesterol: 131 mg/dL
TRIGLYCERIDES: 139 mg/dL (ref 40–160)

## 2015-08-29 LAB — HEPATIC FUNCTION PANEL
ALT: 6 U/L — AB (ref 10–40)
AST: 11 U/L — AB (ref 14–40)
Alkaline Phosphatase: 86 U/L (ref 25–125)
BILIRUBIN, TOTAL: 0.3 mg/dL

## 2015-09-30 ENCOUNTER — Non-Acute Institutional Stay (SKILLED_NURSING_FACILITY): Payer: Medicare Other | Admitting: Adult Health

## 2015-09-30 ENCOUNTER — Encounter: Payer: Self-pay | Admitting: Adult Health

## 2015-09-30 DIAGNOSIS — I509 Heart failure, unspecified: Secondary | ICD-10-CM

## 2015-09-30 DIAGNOSIS — M179 Osteoarthritis of knee, unspecified: Secondary | ICD-10-CM

## 2015-09-30 DIAGNOSIS — I10 Essential (primary) hypertension: Secondary | ICD-10-CM

## 2015-09-30 DIAGNOSIS — F0390 Unspecified dementia without behavioral disturbance: Secondary | ICD-10-CM

## 2015-09-30 DIAGNOSIS — J309 Allergic rhinitis, unspecified: Secondary | ICD-10-CM | POA: Diagnosis not present

## 2015-09-30 DIAGNOSIS — N183 Chronic kidney disease, stage 3 unspecified: Secondary | ICD-10-CM

## 2015-09-30 DIAGNOSIS — I6990 Unspecified sequelae of unspecified cerebrovascular disease: Secondary | ICD-10-CM

## 2015-09-30 DIAGNOSIS — D509 Iron deficiency anemia, unspecified: Secondary | ICD-10-CM

## 2015-09-30 DIAGNOSIS — M1710 Unilateral primary osteoarthritis, unspecified knee: Secondary | ICD-10-CM

## 2015-09-30 DIAGNOSIS — E785 Hyperlipidemia, unspecified: Secondary | ICD-10-CM

## 2015-09-30 NOTE — Progress Notes (Signed)
Patient ID: Elijah Ramos, male   DOB: Dec 28, 1925, 82 y.o.   MRN: [id number]   Facility: [facility]       Allergies  Allergen Reactions  . Aleve [Naproxen Sodium]   . Beta Adrenergic Blockers   . Penicillins   . Sulfa Antibiotics     Chief Complaint  Patient presents with  . Medical Management of Chronic Issues    Follow up    HPI:  He is a long term resident of this facility being seen for the management of his chronic illnesses. Overall his status is stable. His weight is 165.5 pounds. He is very hard of hearing and is unable to fully participate in the hpi or ros; but he did tell me that he is feeling good. There are no nursing concerns at this time.   Past Medical History  Diagnosis Date  . Protein calorie malnutrition ([facility])   . Dysphagia   . CHF (congestive heart failure) ([facility])   . CKD (chronic kidney disease)   . Dementia without behavioral disturbance   . Hypertension   . Personal history of DVT (deep vein thrombosis)   . H/O: alcohol abuse   . Stroke ([facility])   . PNA (pneumonia)     RLL  . H/O acute respiratory failure   . Deafness, bilateral     reads lips  . Hyperkalemia   . Bilateral chronic knee pain   . Late effects of CVA (cerebrovascular accident)     History reviewed. No pertinent past surgical history.  VITAL SIGNS BP 114/68 mmHg  Pulse 59  Temp(Src) 98.5 F (36.9 C) (Oral)  Resp 20  Ht  (1.753 m)  Wt 165 lb 8 oz (75.07 kg)  BMI 24.43 kg/m2  Patient's Medications  New Prescriptions   No medications on file  Previous Medications   ACIDOPHILUS (RISAQUAD) CAPS CAPSULE    Take 1 capsule by mouth 2 (two) times daily. For digestive health   ASPIRIN 81 MG TABLET    Take 81 mg by mouth daily. For hx cva   ATORVASTATIN (LIPITOR) 10 MG TABLET    Take 5 mg by mouth daily. For CHF   CETIRIZINE (ZYRTEC ALLERGY) 10 MG TABLET    Take 10 mg by mouth at bedtime. For allergies   DOCUSATE SODIUM (COLACE) 100 MG CAPSULE    Take 100 mg by mouth daily.  For constipation   DONEPEZIL (ARICEPT) 10 MG TABLET    Take 10 mg by mouth at bedtime. For dementia   FLUTICASONE (FLONASE) 50 MCG/ACT NASAL SPRAY    Place 2 sprays into both nostrils daily.    FOLIC ACID (FOLVITE) 1 MG TABLET    Take 1 mg by mouth daily.   MAGNESIUM HYDROXIDE (MILK OF MAGNESIA PO)    If no BM in 3 days, give 30 cc of milk of magnesium by mouth x 1 dose in 24 hour as needed   MEMANTINE (NAMENDA XR) 14 MG CP24 24 HR CAPSULE    Take 14 mg by mouth daily.   MULTIPLE MINERALS-VITAMINS PO    1 tablet by mouth for wound healing   NUTRITIONAL SUPPLEMENTS (ENSURE CLEAR) LIQD    Give 200 ml by mouth twice daily ( JUICE)   OXYCODONE (OXY-IR) 5 MG CAPSULE    Take 5 mg by mouth every 6 (six) hours as needed. For moderate to severe pain   POLYETHYLENE GLYCOL (MIRALAX / GLYCOLAX) PACKET    Take 17 g by mouth daily. For bowel health  SODIUM BICARBONATE 650 MG TABLET    Take 650 mg by mouth 2 (two) times daily. For chronic kidney disease   THIAMINE 100 MG TABLET    Take 100 mg by mouth daily. For supplement  Modified Medications   No medications on file  Discontinued Medications   No medications on file     SIGNIFICANT DIAGNOSTIC EXAMS  LABS REVIEWED:   08-29-15: wbc 6.0; hgb 12.1; hct 37.5; mcv 93.3; plt 249; glucose 129; bun 40; creat 3.0; k+ 4.8; na++141; liver normal albumin 3.2; chol 197; dld 131; trig 139; hdl 38    Review of Systems  Unable to perform ROS: other  hard of hearing      Physical Exam  Constitutional: No distress.  Eyes: Conjunctivae are normal.  Neck: Neck supple. No JVD present. No thyromegaly present.  Cardiovascular: Normal rate, regular rhythm and intact distal pulses.   Respiratory: Effort normal and breath sounds normal. No respiratory distress. He has no wheezes.  GI: Soft. Bowel sounds are normal. He exhibits no distension. There is no tenderness.  Musculoskeletal: He exhibits no edema.  Able to move all extremities   Lymphadenopathy:    He has  no cervical adenopathy.  Neurological: He is alert.  Skin: Skin is warm and dry. He is not diaphoretic.  Psychiatric: He has a normal mood and affect.       ASSESSMENT/ PLAN:  1. Dementia: no significant change in status; weight is 165.5 pounds; will continue aricept 10 mg nightly and namenda xr 14 mg daily   2. Dyslipidemia: will continue lipitor 10 mg daily ldl is 131  3. Constipation: will continue colace daily and miralax daily   4. Anemia: hgb is 12.1; will monitor   5. Allergic rhinitis: will continue zyrtec 10 mg daily and flonase daily   6. CKD stage III: creat/bun 40/3.0; will continue sodium bicarb 650 mg twice daily   7. CVA: is neurologically stable; will continue asa 81 mg daily   8. CHF: is stable is presently not on medications will monitor  9.  Chronic bilateral knee pain: will continue oxycodone 5 mg every 6 hours as needed    Time spent with patient 45   minutes >50% time spent counseling; reviewing medical record; tests; labs; and developing future plan of care   Synthia Innocenteborah Green NP [facility]  Contact [phone] Monday through Friday 8am- 5pm  After hours call [phone]

## 2015-10-31 ENCOUNTER — Non-Acute Institutional Stay (SKILLED_NURSING_FACILITY): Payer: Medicare Other | Admitting: Internal Medicine

## 2015-10-31 ENCOUNTER — Encounter: Payer: Self-pay | Admitting: Internal Medicine

## 2015-10-31 DIAGNOSIS — N183 Chronic kidney disease, stage 3 unspecified: Secondary | ICD-10-CM

## 2015-10-31 DIAGNOSIS — H9193 Unspecified hearing loss, bilateral: Secondary | ICD-10-CM | POA: Diagnosis not present

## 2015-10-31 DIAGNOSIS — M1710 Unilateral primary osteoarthritis, unspecified knee: Secondary | ICD-10-CM

## 2015-10-31 DIAGNOSIS — I10 Essential (primary) hypertension: Secondary | ICD-10-CM

## 2015-10-31 DIAGNOSIS — D509 Iron deficiency anemia, unspecified: Secondary | ICD-10-CM

## 2015-10-31 DIAGNOSIS — M179 Osteoarthritis of knee, unspecified: Secondary | ICD-10-CM

## 2015-10-31 DIAGNOSIS — F0390 Unspecified dementia without behavioral disturbance: Secondary | ICD-10-CM | POA: Diagnosis not present

## 2015-10-31 DIAGNOSIS — I509 Heart failure, unspecified: Secondary | ICD-10-CM | POA: Diagnosis not present

## 2015-10-31 DIAGNOSIS — E785 Hyperlipidemia, unspecified: Secondary | ICD-10-CM

## 2015-10-31 DIAGNOSIS — J309 Allergic rhinitis, unspecified: Secondary | ICD-10-CM

## 2015-10-31 NOTE — Progress Notes (Signed)
Patient ID: Elijah SarasWilliam R Ramos, male   DOB: 04/10/1926, 82 y.o.   MRN: [id number]    DATE: 10/31/15  Location:  [facility]  Nursing Home Room Number: 129 A Place of Service: SNF (31)   Extended Emergency Contact Information Primary Emergency Contact: Strahm,[name] Address: [address]          [place], [place] [address] [place] Home Phone: [phone] Mobile Phone: [phone] Relation: Son Secondary Emergency Contact: Chamblin,[name]  [place] Mobile Phone: [phone] Relation: Other  Advanced Directive information Does patient have an advance directive?: No, Would patient like information on creating an advanced directive?: No - patient declined information  Chief Complaint  Patient presents with  . Medical Management of Chronic Issues    Routine Visit    HPI:  82 yo male long term resident seen today for f/u. He reports rhinorrhea and chest congestion. He uses flonase and takes zyrtec for seasonal allergy. No nursing issues. No falls. He is a poor historian due to dementia and HOH. Hx obtained from chart and nursing  Dementia - stable on aricept 10 mg nightly and namenda xr 14 mg daily   Hyperlipidemia - stable on lipitor 10 mg daily. LDL 131  Constipation - stable on colace daily and miralax daily   Hx Anemia - stable. hgb 12.1  CKD stage III - Cr 3.0. Takes sodium bicarb 650 mg twice daily   Hx CVA - stable. takes asa 81 mg daily. Hx dysphagia and takes nutritional supplements  Hx CHF - stable without meds. No recent 2D echo  Chronic bilateral knee pain - stable on oxycodone 5 mg every 6 hours as needed  Hx Etoh abuse - takes thiamine and folate supplements  Past Medical History  Diagnosis Date  . Protein calorie malnutrition (HCC)   . Dysphagia   . CHF (congestive heart failure) (HCC)   . CKD (chronic kidney disease)   . Dementia without behavioral disturbance   . Hypertension   . Personal history of DVT  (deep vein thrombosis)   . H/O: alcohol abuse   . Stroke (HCC)   . PNA (pneumonia)     RLL  . H/O acute respiratory failure   . Deafness, bilateral     reads lips  . Hyperkalemia   . Bilateral chronic knee pain   . Late effects of CVA (cerebrovascular accident)     History reviewed. No pertinent past surgical history.  Patient Care Team: Kirt BoysMonica Crammer, DO as PCP - General (Internal Medicine) [facility])  Social History   Social History  . Marital Status: Single    Spouse Name: N/A  . Number of Children: N/A  . Years of Education: N/A   Occupational History  . Not on file.   Social History Main Topics  . Smoking status: Unknown If Ever Smoked  . Smokeless tobacco: Not on file  . Alcohol Use: Yes     Comment: former abuse  . Drug Use: Not on file  . Sexual Activity: No   Other Topics Concern  . Not on file   Social History Narrative     reports that he drinks alcohol. His tobacco and drug histories are not on file.  Immunization History  Administered Date(s) Administered  . Influenza-Unspecified 04/27/2015  . PPD Test 11/16/2013    Allergies  Allergen Reactions  . Aleve [Naproxen Sodium]   . Beta Adrenergic Blockers   . Penicillins   .  Sulfa Antibiotics     Medications: Patient's Medications  New Prescriptions   No medications on file  Previous Medications   ACIDOPHILUS (RISAQUAD) CAPS CAPSULE    Take 1 capsule by mouth 2 (two) times daily. For digestive health   ASPIRIN 81 MG TABLET    Take 81 mg by mouth daily. For hx cva   ATORVASTATIN (LIPITOR) 10 MG TABLET    Take 5 mg by mouth daily. For CHF   CETIRIZINE (ZYRTEC ALLERGY) 10 MG TABLET    Take 10 mg by mouth at bedtime. For allergies   DOCUSATE SODIUM (COLACE) 100 MG CAPSULE    Take 100 mg by mouth daily. For constipation   DONEPEZIL (ARICEPT) 10 MG TABLET    Take 10 mg by mouth at bedtime. For dementia   FLUTICASONE (FLONASE) 50 MCG/ACT NASAL SPRAY     Place 2 sprays into both nostrils daily.    FOLIC ACID (FOLVITE) 1 MG TABLET    Take 1 mg by mouth daily.   MAGNESIUM HYDROXIDE (MILK OF MAGNESIA PO)    If no BM in 3 days, give 30 cc of milk of magnesium by mouth x 1 dose in 24 hour as needed   MEMANTINE (NAMENDA XR) 14 MG CP24 24 HR CAPSULE    Take 14 mg by mouth daily.   MULTIPLE MINERALS-VITAMINS PO    1 tablet by mouth for wound healing   NUTRITIONAL SUPPLEMENTS (ENSURE CLEAR) LIQD    Give 200 ml by mouth twice daily ( JUICE)   OXYCODONE (OXY-IR) 5 MG CAPSULE    Take 5 mg by mouth every 6 (six) hours as needed. For moderate to severe pain   POLYETHYLENE GLYCOL (MIRALAX / GLYCOLAX) PACKET    Take 17 g by mouth daily. For bowel health   SODIUM BICARBONATE 650 MG TABLET    Take 650 mg by mouth 2 (two) times daily. For chronic kidney disease   THIAMINE 100 MG TABLET    Take 100 mg by mouth daily. For supplement  Modified Medications   No medications on file  Discontinued Medications   No medications on file    Review of Systems  Unable to perform ROS: Dementia    Filed Vitals:   10/31/15 0952  BP: 132/79  Pulse: 80  Temp: 97.5 F (36.4 C)  TempSrc: Oral  Resp: 18  Height:  (1.753 m)  Weight: 167 lb 6.4 oz (75.932 kg)   Body mass index is 24.71 kg/(m^2).  Physical Exam  Constitutional: He appears well-developed.  Frail appearing in NAD. He is deaf and uses eraser board to communicate. Lying in bed  HENT:  Mouth/Throat: Oropharynx is clear and moist.  Eyes: Pupils are equal, round, and reactive to light. No scleral icterus.  Neck: Neck supple. Carotid bruit is present (b/l systolic).  Cardiovascular: Normal rate, regular rhythm and intact distal pulses.  Exam reveals no gallop and no friction rub.   Murmur (2/6 SEm ---> carotid b/l) heard. no distal LE swelling. No calf TTP  Pulmonary/Chest: Effort normal. He has wheezes (end expiratory with cough). He has no rales. He exhibits no tenderness.  Abdominal: Soft. Bowel  sounds are normal. He exhibits no distension, no abdominal bruit, no pulsatile midline mass and no mass. There is no tenderness. There is no rebound and no guarding.  Musculoskeletal: He exhibits edema and tenderness.  Right medial knee bony deformity with swelling, TTP and reduced full extension.  Lymphadenopathy:    He has no cervical adenopathy.  Neurological: He is alert.  Skin: Skin is warm and dry. No rash noted.  Psychiatric: He has a normal mood and affect. His behavior is normal.     Labs reviewed: Nursing Home on 09/30/2015  Component Date Value Ref Range Status  . Glucose 08/29/2015 129   Final  . BUN 08/29/2015 40* 4 - 21 mg/dL Final  . Creatinine 16/04/9603 3.0* 0.6 - 1.3 mg/dL Final  . Potassium 54/03/8118 4.8  3.4 - 5.3 mmol/L Final  . Sodium 08/29/2015 141  137 - 147 mmol/L Final  . Alkaline Phosphatase 08/29/2015 86  25 - 125 U/L Final  . ALT 08/29/2015 6* 10 - 40 U/L Final  . AST 08/29/2015 11* 14 - 40 U/L Final  . Bilirubin, Total 08/29/2015 0.3   Final  . Hemoglobin 08/29/2015 12.1* 13.5 - 17.5 g/dL Final  . HCT 14/78/2956 38* 41 - 53 % Final  . Platelets 08/29/2015 249  150 - 399 K/L Final  . WBC 08/29/2015 6.0   Final  . Triglycerides 08/29/2015 139  40 - 160 mg/dL Final  . Cholesterol 21/30/8657 197  0 - 200 mg/dL Final  . HDL 84/69/6295 5* 35 - 70 mg/dL Final  . LDL Cholesterol 08/29/2015 131   Final    No results found.   Assessment/Plan   ICD-9-CM ICD-10-CM   1. Dementia without behavioral disturbance 294.20 F03.90   2. Deafness, bilateral 389.9 H91.93   3. Essential hypertension 401.9 I10   4. Chronic congestive heart failure, unspecified congestive heart failure type (HCC) 428.0 I50.9   5. Osteoarthritis of knee, unspecified laterality, unspecified osteoarthritis type 715.96 M17.9   6. Hyperlipidemia 272.4 E78.5   7. CKD (chronic kidney disease) stage 3, GFR 30-59 ml/min 585.3 N18.3   8. Anemia, iron deficiency 280.9 D50.9   9. Allergic  rhinitis, unspecified allergic rhinitis type 477.9 J30.9     Pt is medically stable on current tx plan. Continue current medications as ordered. PT/OT/ST as indicated. Will follow  Wanette Robison S. Ancil Linsey  [facility] [address] [place], [place] [address] [phone] Cell (Monday-Friday 8 AM - 5 PM) [phone] After 5 PM and follow prompts

## 2015-11-28 ENCOUNTER — Encounter: Payer: Self-pay | Admitting: Internal Medicine

## 2015-11-28 ENCOUNTER — Non-Acute Institutional Stay (SKILLED_NURSING_FACILITY): Payer: Medicare Other | Admitting: Internal Medicine

## 2015-11-28 DIAGNOSIS — E785 Hyperlipidemia, unspecified: Secondary | ICD-10-CM | POA: Diagnosis not present

## 2015-11-28 DIAGNOSIS — F0390 Unspecified dementia without behavioral disturbance: Secondary | ICD-10-CM

## 2015-11-28 DIAGNOSIS — N183 Chronic kidney disease, stage 3 unspecified: Secondary | ICD-10-CM

## 2015-11-28 DIAGNOSIS — R059 Cough, unspecified: Secondary | ICD-10-CM

## 2015-11-28 DIAGNOSIS — I6990 Unspecified sequelae of unspecified cerebrovascular disease: Secondary | ICD-10-CM | POA: Diagnosis not present

## 2015-11-28 DIAGNOSIS — I10 Essential (primary) hypertension: Secondary | ICD-10-CM | POA: Diagnosis not present

## 2015-11-28 DIAGNOSIS — M25551 Pain in right hip: Secondary | ICD-10-CM | POA: Diagnosis not present

## 2015-11-28 DIAGNOSIS — I509 Heart failure, unspecified: Secondary | ICD-10-CM | POA: Diagnosis not present

## 2015-11-28 DIAGNOSIS — R0989 Other specified symptoms and signs involving the circulatory and respiratory systems: Secondary | ICD-10-CM | POA: Diagnosis not present

## 2015-11-28 DIAGNOSIS — R05 Cough: Secondary | ICD-10-CM | POA: Diagnosis not present

## 2015-11-28 DIAGNOSIS — H9193 Unspecified hearing loss, bilateral: Secondary | ICD-10-CM | POA: Diagnosis not present

## 2015-11-28 NOTE — Progress Notes (Signed)
DATE: 11/28/15  Location:  [facility]  Nursing Home Room Number: 129-A Place of Service: SNF (31)   Extended Emergency Contact Information Primary Emergency Contact: Rodak,[name] Address: [address]          [place], [place] [address] [name] Home Phone: [phone] Mobile Phone: [phone] Relation: Son Secondary Emergency Contact: Macias,[name]  [place] Mobile Phone: [phone] Relation: Other  Advanced Directive information  FULL CODE  Chief Complaint  Patient presents with  . Medical Management of Chronic Issues    HPI:  82 yo male long term resident seen today for f/u. He c/o right hip feels sore. He also reports rhinorrhea and occasional cough with mucous production. No f/c. No nursing issues. No falls. Appetite is okay. He is a poor historian due to dementia and severely HOH. Hx obtained from chart  He uses flonase and takes zyrtec for seasonal allergy.  Dementia - stable on aricept 10 mg nightly and namenda xr 14 mg daily   Hyperlipidemia - stable on lipitor 10 mg daily. LDL 131  Constipation - stable on colace daily and miralax daily   Hx Anemia - stable. hgb 12.1  CKD stage III - Cr 3.0. Takes sodium bicarb 650 mg twice daily   Hx CVA - stable. takes asa 81 mg daily. Hx dysphagia and takes nutritional supplements  Hx CHF - stable without meds. No recent 2D echo  Chronic bilateral knee pain - stable on oxycodone 5 mg every 6 hours as needed  Hx Etoh abuse - takes thiamine and folate supplements   Past Medical History  Diagnosis Date  . Protein calorie malnutrition (HCC)   . Dysphagia   . CHF (congestive heart failure) (HCC)   . CKD (chronic kidney disease)   . Dementia without behavioral disturbance   . Hypertension   . Personal history of DVT (deep vein thrombosis)   . H/O: alcohol abuse   . Stroke (HCC)   . PNA (pneumonia)     RLL  . H/O acute respiratory failure   . Deafness,  bilateral     reads lips  . Hyperkalemia   . Bilateral chronic knee pain   . Late effects of CVA (cerebrovascular accident)     No past surgical history on file.  Patient Care Team: Kirt Boys, DO as PCP - General (Internal Medicine) [facility] (Sk[facility] Nursing Facility)  Social History   Social History  . Marital Status: Single    Spouse Name: N/A  . Number of Children: N/A  . Years of Education: N/A   Occupational History  . Not on file.   Social History Main Topics  . Smoking status: Unknown If Ever Smoked  . Smokeless tobacco: Not on file  . Alcohol Use: Yes     Comment: former abuse  . Drug Use: Not on file  . Sexual Activity: No   Other Topics Concern  . Not on file   Social History Narrative     reports that he drinks alcohol. His tobacco and drug histories are not on file.  Immunization History  Administered Date(s) Administered  . Influenza-Unspecified 04/27/2015  . PPD Test 11/16/2013    Allergies  Allergen Reactions  . Aleve [Naproxen Sodium]   . Beta Adrenergic Blockers   . Penicillins   . Sulfa Antibiotics     Medications: Patient's Medications  New Prescriptions   No medications on file  Previous Medications   ACIDOPHILUS (RISAQUAD)  CAPS CAPSULE    Take 1 capsule by mouth 2 (two) times daily. For digestive health   ASPIRIN 81 MG TABLET    Take 81 mg by mouth daily. For hx cva   ATORVASTATIN (LIPITOR) 10 MG TABLET    Take 5 mg by mouth daily. For CHF   CETIRIZINE (ZYRTEC ALLERGY) 10 MG TABLET    Take 10 mg by mouth at bedtime. For allergies   DOCUSATE SODIUM (COLACE) 100 MG CAPSULE    Take 100 mg by mouth daily. For constipation   DONEPEZIL (ARICEPT) 10 MG TABLET    Take 10 mg by mouth at bedtime. For dementia   FLUTICASONE (FLONASE) 50 MCG/ACT NASAL SPRAY    Place 2 sprays into both nostrils daily.    FOLIC ACID (FOLVITE) 1 MG TABLET    Take 1 mg by mouth daily.   MAGNESIUM HYDROXIDE (MILK OF MAGNESIA PO)    If no BM  in 3 days, give 30 cc of milk of magnesium by mouth x 1 dose in 24 hour as needed   MEMANTINE (NAMENDA XR) 14 MG CP24 24 HR CAPSULE    Take 14 mg by mouth daily.   MULTIPLE MINERALS-VITAMINS PO    1 tablet by mouth for wound healing   NUTRITIONAL SUPPLEMENTS (ENSURE CLEAR) LIQD    Give 200 ml by mouth twice daily ( JUICE)   OXYCODONE (OXY-IR) 5 MG CAPSULE    Take 5 mg by mouth every 6 (six) hours as needed. For moderate to severe pain   POLYETHYLENE GLYCOL (MIRALAX / GLYCOLAX) PACKET    Take 17 g by mouth daily. For bowel health   SODIUM BICARBONATE 650 MG TABLET    Take 650 mg by mouth 2 (two) times daily. For chronic kidney disease   THIAMINE 100 MG TABLET    Take 100 mg by mouth daily. For supplement  Modified Medications   No medications on file  Discontinued Medications   No medications on file    Review of Systems  Unable to perform ROS: Dementia  also severely HOH  Filed Vitals:   11/28/15 0947  BP: 132/79  Pulse: 81  Temp: 97.9 F (36.6 C)  TempSrc: Oral  Resp: 18  Height:  (1.753 m)  Weight: 162 lb 9.6 oz (73.755 kg)   Body mass index is 24 kg/(m^2).  Physical Exam  Constitutional: He appears well-developed.  Frail appearing in NAD. He is deaf and uses eraser board to communicate. Lying in bed  HENT:  Mouth/Throat: Oropharynx is clear and moist.  Eyes: Pupils are equal, round, and reactive to light. No scleral icterus.  Neck: Neck supple. Carotid bruit is present (b/l systolic).  Cardiovascular: Normal rate, regular rhythm and intact distal pulses.  Exam reveals no gallop and no friction rub.   Murmur (2/6 SEm ---> carotid b/l) heard. no distal LE swelling. No calf TTP  Pulmonary/Chest: Effort normal. He has no wheezes. He has no rales. He exhibits no tenderness.  Congested BS at base b/l but no w/r/r  Abdominal: Soft. Bowel sounds are normal. He exhibits no distension, no abdominal bruit, no pulsatile midline mass and no mass. There is no tenderness. There is  no rebound and no guarding.  Musculoskeletal: He exhibits edema and tenderness.  Right medial knee bony deformity with swelling, TTP and reduced full extension. Reduced right hip internal rotation  Lymphadenopathy:    He has no cervical adenopathy.  Neurological: He is alert.  Skin: Skin is warm and dry.  No rash noted.  Psychiatric: He has a normal mood and affect. His behavior is normal.     Labs reviewed: Nursing Home on 09/30/2015  Component Date Value Ref Range Status  . Glucose 08/29/2015 129   Final  . BUN 08/29/2015 40* 4 - 21 mg/dL Final  . Creatinine 16/10/960402/13/2017 3.0* 0.6 - 1.3 mg/dL Final  . Potassium 54/09/811902/13/2017 4.8  3.4 - 5.3 mmol/L Final  . Sodium 08/29/2015 141  137 - 147 mmol/L Final  . Alkaline Phosphatase 08/29/2015 86  25 - 125 U/L Final  . ALT 08/29/2015 6* 10 - 40 U/L Final  . AST 08/29/2015 11* 14 - 40 U/L Final  . Bilirubin, Total 08/29/2015 0.3   Final  . Hemoglobin 08/29/2015 12.1* 13.5 - 17.5 g/dL Final  . HCT 14/78/295602/13/2017 38* 41 - 53 % Final  . Platelets 08/29/2015 249  150 - 399 K/L Final  . WBC 08/29/2015 6.0   Final  . Triglycerides 08/29/2015 139  40 - 160 mg/dL Final  . Cholesterol 21/30/865702/13/2017 197  0 - 200 mg/dL Final  . HDL 84/69/629502/13/2017 5* 35 - 70 mg/dL Final  . LDL Cholesterol 08/29/2015 131   Final    No results found.   Assessment/Plan   ICD-9-CM ICD-10-CM   1. Cough 786.2 R05   2. Abnormal chest sounds 786.7 R09.89   3. Right hip pain 719.45 M25.551    with hx OA  4. Dementia without behavioral disturbance 294.20 F03.90   5. Chronic congestive heart failure, unspecified congestive heart failure type (HCC) 428.0 I50.9   6. Essential hypertension 401.9 I10   7. Late effects of CVA (cerebrovascular accident) 438.9 I69.90   8. CKD (chronic kidney disease) stage 3, GFR 30-59 ml/min 585.3 N18.3   9. Hyperlipidemia 272.4 E78.5   10. Deafness, bilateral 389.9 H91.93    Check CXR to r/o pneumonia and exac CHF  Cont oxy IR for pain  Check CMP,  lipid panel, TSH and CBC  Cont other meds as ordered  Aspiration precautions  Fall precautions  PT/OT/ST as indicated  Will follow  Omar Orrego S. Ancil Linseyarter, D. O., F. A. C. O. I.  [facility] [address] [place], [place] [address] [phone] Cell (Monday-Friday 8 AM - 5 PM) [phone] After 5 PM and follow prompts

## 2015-11-30 LAB — BASIC METABOLIC PANEL
BUN: 37 mg/dL — AB (ref 4–21)
CREATININE: 2.2 mg/dL — AB (ref 0.6–1.3)
Glucose: 87 mg/dL
POTASSIUM: 4.8 mmol/L (ref 3.4–5.3)
SODIUM: 140 mmol/L (ref 137–147)

## 2015-11-30 LAB — LIPID PANEL
CHOLESTEROL: 144 mg/dL (ref 0–200)
HDL: 31 mg/dL — AB (ref 35–70)
LDL Cholesterol: 94 mg/dL
Triglycerides: 96 mg/dL (ref 40–160)

## 2015-11-30 LAB — HEPATIC FUNCTION PANEL
ALT: 6 U/L — AB (ref 10–40)
AST: 10 U/L — AB (ref 14–40)
Alkaline Phosphatase: 72 U/L (ref 25–125)
Bilirubin, Total: 0.2 mg/dL

## 2015-11-30 LAB — TSH: TSH: 0.76 u[IU]/mL (ref 0.41–5.90)

## 2015-11-30 LAB — CBC AND DIFFERENTIAL
HCT: 31 % — AB (ref 41–53)
Hemoglobin: 9.9 g/dL — AB (ref 13.5–17.5)
PLATELETS: 279 10*3/uL (ref 150–399)
WBC: 5.7 10*3/mL

## 2016-01-05 ENCOUNTER — Encounter: Payer: Self-pay | Admitting: Internal Medicine

## 2016-01-05 ENCOUNTER — Non-Acute Institutional Stay (SKILLED_NURSING_FACILITY): Payer: Medicare Other | Admitting: Internal Medicine

## 2016-01-05 DIAGNOSIS — I6990 Unspecified sequelae of unspecified cerebrovascular disease: Secondary | ICD-10-CM

## 2016-01-05 DIAGNOSIS — G8929 Other chronic pain: Secondary | ICD-10-CM

## 2016-01-05 DIAGNOSIS — F0390 Unspecified dementia without behavioral disturbance: Secondary | ICD-10-CM

## 2016-01-05 DIAGNOSIS — I509 Heart failure, unspecified: Secondary | ICD-10-CM

## 2016-01-05 DIAGNOSIS — M25562 Pain in left knee: Secondary | ICD-10-CM | POA: Diagnosis not present

## 2016-01-05 DIAGNOSIS — D509 Iron deficiency anemia, unspecified: Secondary | ICD-10-CM | POA: Diagnosis not present

## 2016-01-05 DIAGNOSIS — M25561 Pain in right knee: Secondary | ICD-10-CM

## 2016-01-05 DIAGNOSIS — J309 Allergic rhinitis, unspecified: Secondary | ICD-10-CM

## 2016-01-05 DIAGNOSIS — I10 Essential (primary) hypertension: Secondary | ICD-10-CM | POA: Diagnosis not present

## 2016-01-05 NOTE — Progress Notes (Signed)
DATE: 01/05/16  Location:  [facility]  Nursing Home Room Number: 129 A Place of Service: SNF (31)   Extended Emergency Contact Information Primary Emergency Contact: Massoud,[name] Address: [address]          [place], [place] [address] [name] Home Phone: [phone] Mobile Phone: [phone] Relation: Son Secondary Emergency Contact: Hogg,[name]  [place] Mobile Phone: [phone] Relation: Other  Advanced Directive information Does patient have an advance directive?: No, Would patient like information on creating an advanced directive?: No - patient declined information, Does patient want to make changes to advanced directive?: No - Patient declined FULL CODE Chief Complaint  Patient presents with  . Medical Management of Chronic Issues    Routine Visit    HPI:  82 yo male long term resident seen today for f/u. He c/o rhinorrhea and knee pain today. He requests aspercreme. No falls. No nursing issues. He is a poor historian due to dementia and severely HOH. Hx obtained from chart  He uses flonase and takes zyrtec for seasonal allergy.  Dementia - stable on aricept 10 mg nightly and namenda xr 14 mg daily   Hyperlipidemia - stable on lipitor 10 mg daily. LDL 94; HDL 31  Constipation - stable on colace daily and miralax daily   Hx Anemia - stable. hgb 9.9  CKD stage III - Cr 2.2. Takes sodium bicarb 650 mg twice daily   Hx CVA - stable. takes asa 81 mg daily. Hx dysphagia and takes nutritional supplements  Hx CHF - stable without meds. No recent 2D echo. CXR  11/28/15 showed right moderate sized pleural effusion with RLL nonspecific airspace opacity.  Chronic bilateral knee pain - borderline controlled on oxycodone 5 mg every 6 hours as needed  Hx Etoh abuse - takes thiamine and folate supplements    Past Medical History  Diagnosis Date  . Protein calorie malnutrition (HCC)   . Dysphagia   . CHF  (congestive heart failure) (HCC)   . CKD (chronic kidney disease)   . Dementia without behavioral disturbance   . Hypertension   . Personal history of DVT (deep vein thrombosis)   . H/O: alcohol abuse   . Stroke (HCC)   . PNA (pneumonia)     RLL  . H/O acute respiratory failure   . Deafness, bilateral     reads lips  . Hyperkalemia   . Bilateral chronic knee pain   . Late effects of CVA (cerebrovascular accident)     History reviewed. No pertinent past surgical history.  Patient Care Team: Kirt Boys, DO as PCP - General (Internal Medicine) [facility] (Sk[facility] Nursing Facility)  Social History   Social History  . Marital Status: Single    Spouse Name: N/A  . Number of Children: N/A  . Years of Education: N/A   Occupational History  . Not on file.   Social History Main Topics  . Smoking status: Unknown If Ever Smoked  . Smokeless tobacco: Not on file  . Alcohol Use: Yes     Comment: former abuse  . Drug Use: Not on file  . Sexual Activity: No   Other Topics Concern  . Not on file   Social History Narrative     reports that he drinks alcohol. His tobacco and drug histories are not on file.  History reviewed. No pertinent family history. No family status information on file.    Immunization History  Administered  Date(s) Administered  . Influenza-Unspecified 04/27/2015  . PPD Test 11/16/2013    Allergies  Allergen Reactions  . Aleve [Naproxen Sodium]   . Beta Adrenergic Blockers   . Penicillins   . Sulfa Antibiotics     Medications: Patient's Medications  New Prescriptions   No medications on file  Previous Medications   ACIDOPHILUS (RISAQUAD) CAPS CAPSULE    Take 1 capsule by mouth 2 (two) times daily. For digestive health   ASPIRIN 81 MG TABLET    Take 81 mg by mouth daily. For hx cva   ATORVASTATIN (LIPITOR) 10 MG TABLET    Take 5 mg by mouth daily. For CHF   CETIRIZINE (ZYRTEC ALLERGY) 10 MG TABLET    Take 10 mg by mouth  at bedtime. For allergies   DOCUSATE SODIUM (COLACE) 100 MG CAPSULE    Take 100 mg by mouth daily. For constipation   DONEPEZIL (ARICEPT) 10 MG TABLET    Take 10 mg by mouth at bedtime. For dementia   FLUTICASONE (FLONASE) 50 MCG/ACT NASAL SPRAY    Place 2 sprays into both nostrils daily.    FOLIC ACID (FOLVITE) 1 MG TABLET    Take 1 mg by mouth daily.   MAGNESIUM HYDROXIDE (MILK OF MAGNESIA PO)    If no BM in 3 days, give 30 cc of milk of magnesium by mouth x 1 dose in 24 hour as needed   MEMANTINE (NAMENDA XR) 14 MG CP24 24 HR CAPSULE    Take 14 mg by mouth daily.   MULTIPLE MINERALS-VITAMINS PO    1 tablet by mouth for wound healing   NUTRITIONAL SUPPLEMENTS (ENSURE CLEAR) LIQD    Give 200 ml by mouth twice daily ( JUICE)   OXYCODONE (OXY-IR) 5 MG CAPSULE    Take 5 mg by mouth every 6 (six) hours as needed. For moderate to severe pain   POLYETHYLENE GLYCOL (MIRALAX / GLYCOLAX) PACKET    Take 17 g by mouth daily. For bowel health   SODIUM BICARBONATE 650 MG TABLET    Take 650 mg by mouth 2 (two) times daily. For chronic kidney disease   THIAMINE 100 MG TABLET    Take 100 mg by mouth daily. For supplement  Modified Medications   No medications on file  Discontinued Medications   No medications on file    Review of Systems  Unable to perform ROS: Dementia  and very HOH  Filed Vitals:   01/05/16 0917  BP: 110/72  Pulse: 58  Temp: 98.7 F (37.1 C)  TempSrc: Oral  Resp: 18  Height: 5\' 9"  (1.753 m)  Weight: 163 lb 6.4 oz (74.118 kg)   Body mass index is 24.12 kg/(m^2).  Physical Exam  Constitutional: He appears well-developed.  Frail appearing in NAD. He is deaf and uses eraser board to communicate. Lying in bed  HENT:  Mouth/Throat: Oropharynx is clear and moist.  Eyes: Pupils are equal, round, and reactive to light. No scleral icterus.  Neck: Neck supple. Carotid bruit is present (b/l systolic).  Cardiovascular: Normal rate, regular rhythm and intact distal pulses.  Exam  reveals no gallop and no friction rub.   Murmur (2/6 SEm ---> carotid b/l) heard. no distal LE swelling. No calf TTP  Pulmonary/Chest: Effort normal. He has no wheezes. He has no rales. He exhibits no tenderness.  Abdominal: Soft. Bowel sounds are normal. He exhibits no distension, no abdominal bruit, no pulsatile midline mass and no mass. There is no tenderness. There is  no rebound and no guarding.  Musculoskeletal: He exhibits edema and tenderness.  Right medial knee bony deformity with swelling, TTP and reduced full extension. Reduced right hip internal rotation  Lymphadenopathy:    He has no cervical adenopathy.  Neurological: He is alert.  Skin: Skin is warm and dry. No rash noted.  Psychiatric: He has a normal mood and affect. His behavior is normal.     Labs reviewed: Nursing Home on 01/05/2016  Component Date Value Ref Range Status  . Hemoglobin 11/30/2015 9.9* 13.5 - 17.5 g/dL Final  . HCT 16/10/960405/17/2017 31* 41 - 53 % Final  . Platelets 11/30/2015 279  150 - 399 K/L Final  . WBC 11/30/2015 5.7   Final  . Glucose 11/30/2015 87   Final  . BUN 11/30/2015 37* 4 - 21 mg/dL Final  . Creatinine 54/09/811905/17/2017 2.2* 0.6 - 1.3 mg/dL Final  . Potassium 14/78/295605/17/2017 4.8  3.4 - 5.3 mmol/L Final  . Sodium 11/30/2015 140  137 - 147 mmol/L Final  . Triglycerides 11/30/2015 96  40 - 160 mg/dL Final  . Cholesterol 21/30/865705/17/2017 144  0 - 200 mg/dL Final  . HDL 84/69/629505/17/2017 31* 35 - 70 mg/dL Final  . LDL Cholesterol 11/30/2015 94   Final  . Alkaline Phosphatase 11/30/2015 72  25 - 125 U/L Final  . ALT 11/30/2015 6* 10 - 40 U/L Final  . AST 11/30/2015 10* 14 - 40 U/L Final  . Bilirubin, Total 11/30/2015 0.2   Final  . TSH 11/30/2015 0.76  0.41 - 5.90 uIU/mL Final    No results found.   Assessment/Plan   ICD-9-CM ICD-10-CM   1. Anemia, iron deficiency - worse 280.9 D50.9   2. Bilateral chronic knee pain - pain failing to change as expected 719.46 M25.561    338.29 M25.562     G89.29   3. Allergic  rhinitis, unspecified allergic rhinitis type - stable 477.9 J30.9   4. Dementia without behavioral disturbance - unchanged 294.20 F03.90   5. Chronic congestive heart failure, unspecified congestive heart failure type (HCC) 428.0 I50.9   6. Essential hypertension 401.9 I10   7. Late effects of CVA (cerebrovascular accident) 438.9 I69.90     Check stool for occult blood due to worsening anemia  Check ferritin and iron level  Apply aspercreme to knee b/l up to TID prn pain  Cont other meds as ordered  PT/OT/ST as indicated  Repeat CXR if CHF sx's occur. follow VS  Will follow  Landen Breeland S. Ancil Linseyarter, D. O., F. A. C. O. I.  [facility] [address] [place], [place] [address] [phone] Cell (Monday-Friday 8 AM - 5 PM) [phone] After 5 PM and follow prompts

## 2016-02-08 LAB — CBC AND DIFFERENTIAL
HEMATOCRIT: 35 % — AB (ref 41–53)
HEMOGLOBIN: 11.3 g/dL — AB (ref 13.5–17.5)
Platelets: 235 10*3/uL (ref 150–399)
WBC: 6.3 10^3/mL

## 2016-02-13 ENCOUNTER — Non-Acute Institutional Stay (SKILLED_NURSING_FACILITY): Payer: Medicare Other | Admitting: Internal Medicine

## 2016-02-13 ENCOUNTER — Encounter: Payer: Self-pay | Admitting: Internal Medicine

## 2016-02-13 DIAGNOSIS — G8929 Other chronic pain: Secondary | ICD-10-CM

## 2016-02-13 DIAGNOSIS — E785 Hyperlipidemia, unspecified: Secondary | ICD-10-CM | POA: Diagnosis not present

## 2016-02-13 DIAGNOSIS — I509 Heart failure, unspecified: Secondary | ICD-10-CM

## 2016-02-13 DIAGNOSIS — I6990 Unspecified sequelae of unspecified cerebrovascular disease: Secondary | ICD-10-CM | POA: Diagnosis not present

## 2016-02-13 DIAGNOSIS — F0390 Unspecified dementia without behavioral disturbance: Secondary | ICD-10-CM | POA: Diagnosis not present

## 2016-02-13 DIAGNOSIS — J309 Allergic rhinitis, unspecified: Secondary | ICD-10-CM | POA: Diagnosis not present

## 2016-02-13 DIAGNOSIS — M25562 Pain in left knee: Secondary | ICD-10-CM | POA: Diagnosis not present

## 2016-02-13 DIAGNOSIS — M25561 Pain in right knee: Secondary | ICD-10-CM

## 2016-02-13 DIAGNOSIS — I10 Essential (primary) hypertension: Secondary | ICD-10-CM

## 2016-02-13 NOTE — Progress Notes (Signed)
Patient ID: Elijah Ramos, male   DOB: 1926-01-05, 82 y.o.   MRN: [id number]    DATE: 02/13/16  Location:  Nursing Home Location: [facility]  Nursing Home Room Number: 129 A Place of Service: SNF (31)   Extended Emergency Contact Information Primary Emergency Contact: Mcnall,[name] Address: [address]          [place], [place] [address] [name] of [place] Home Phone: [phone] Mobile Phone: [phone] Relation: Son Secondary Emergency Contact: Vasey,[name]  [place] Mobile Phone: [phone] Relation: Other  Advanced Directive information Does patient have an advance directive?: No, Would patient like information on creating an advanced directive?: No - patient declined information, Does patient want to make changes to advanced directive?: No - Patient declined FULL CODE Chief Complaint  Patient presents with  . Medical Management of Chronic Issues    Routine Visit    HPI:  82 yo male long term resident seen today for f/u. He continues to c/o rhinorrhea and right knee pain. No other concerns. No nursing issues. No falls. He is a poor historian due to dementia and severely HOH. Hx obtained from chart  He uses flonase and takes zyrtec for seasonal allergy.  Dementia - stable on aricept 10 mg nightly and namenda xr 14 mg daily   Hyperlipidemia - stable on lipitor 10 mg daily. LDL 94; HDL 31  Constipation - stable on colace daily and miralax daily   Hx Anemia - stable. hgb 9.9  CKD stage III - Cr 2.2. Takes sodium bicarb 650 mg twice daily   Hx CVA - stable. takes asa 81 mg daily. Hx dysphagia and takes nutritional supplements  Hx CHF - stable without meds. No recent 2D echo. CXR  11/28/15 showed right moderate sized pleural effusion with RLL nonspecific airspace opacity.  Chronic bilateral knee pain - borderline controlled on oxycodone 5 mg every 6 hours as needed  Hx Etoh abuse - takes thiamine and folate supplements   Past  Medical History:  Diagnosis Date  . Bilateral chronic knee pain   . CHF (congestive heart failure) (HCC)   . CKD (chronic kidney disease)   . Deafness, bilateral    reads lips  . Dementia without behavioral disturbance   . Dysphagia   . H/O acute respiratory failure   . H/O: alcohol abuse   . Hyperkalemia   . Hypertension   . Late effects of CVA (cerebrovascular accident)   . Personal history of DVT (deep vein thrombosis)   . PNA (pneumonia)    RLL  . Protein calorie malnutrition (HCC)   . Stroke [facility])     History reviewed. No pertinent surgical history.  Patient Care Team: Kirt Boys, DO as PCP - General (Internal Medicine) [facility] (Sk[facility] Nursing Facility)  Social History   Social History  . Marital status: Single    Spouse name: N/A  . Number of children: N/A  . Years of education: N/A   Occupational History  . Not on file.   Social History Main Topics  . Smoking status: Unknown If Ever Smoked  . Smokeless tobacco: Not on file  . Alcohol use Yes     Comment: former abuse  . Drug use: Unknown  . Sexual activity: No   Other Topics Concern  . Not on file   Social History Narrative  . No narrative on file     reports that he drinks alcohol. His tobacco and drug histories are not on  file.  History reviewed. No pertinent family history. unable to obtain due to dementia No family status information on file.    Immunization History  Administered Date(s) Administered  . Influenza-Unspecified 04/27/2015  . PPD Test 11/16/2013    Allergies  Allergen Reactions  . Aleve [Naproxen Sodium]   . Beta Adrenergic Blockers   . Penicillins   . Sulfa Antibiotics     Medications: Patient's Medications  New Prescriptions   No medications on file  Previous Medications   ACIDOPHILUS (RISAQUAD) CAPS CAPSULE    Take 1 capsule by mouth 2 (two) times daily. For digestive health   ATORVASTATIN (LIPITOR) 10 MG TABLET    Take 5 mg by mouth  daily. For CHF   CETIRIZINE (ZYRTEC ALLERGY) 10 MG TABLET    Take 10 mg by mouth at bedtime. For allergies   DOCUSATE SODIUM (COLACE) 100 MG CAPSULE    Take 100 mg by mouth daily. For constipation   DONEPEZIL (ARICEPT) 10 MG TABLET    Take 10 mg by mouth at bedtime. For dementia   FERROUS SULFATE 325 (65 FE) MG TABLET    Take 325 mg by mouth 2 (two) times daily with a meal.   FLUTICASONE (FLONASE) 50 MCG/ACT NASAL SPRAY    Place 2 sprays into both nostrils daily.    FOLIC ACID (FOLVITE) 1 MG TABLET    Take 1 mg by mouth daily.   MAGNESIUM HYDROXIDE (MILK OF MAGNESIA PO)    If no BM in 3 days, give 30 cc of milk of magnesium by mouth x 1 dose in 24 hour as needed   MEMANTINE (NAMENDA XR) 14 MG CP24 24 HR CAPSULE    Take 14 mg by mouth daily.   MULTIPLE MINERALS-VITAMINS PO    1 tablet by mouth for wound healing   NUTRITIONAL SUPPLEMENTS (ENSURE CLEAR) LIQD    Give 200 ml by mouth twice daily ( JUICE)   OXYCODONE (OXY-IR) 5 MG CAPSULE    Take 5 mg by mouth every 6 (six) hours as needed. For moderate to severe pain   POLYETHYLENE GLYCOL (MIRALAX / GLYCOLAX) PACKET    Take 17 g by mouth daily. For bowel health   SODIUM BICARBONATE 650 MG TABLET    Take 650 mg by mouth 2 (two) times daily. For chronic kidney disease   THIAMINE 100 MG TABLET    Take 100 mg by mouth daily. For supplement   TROLAMINE SALICYLATE (ASPERCREME) 10 % CREAM    Apply 1 application topically as needed for muscle pain (apply to both knees 3 times daily as needed).  Modified Medications   No medications on file  Discontinued Medications   ASPIRIN 81 MG TABLET    Take 81 mg by mouth daily. For hx cva    Review of Systems  Unable to perform ROS: Dementia    Vitals:   02/13/16 1133  BP: 128/64  Pulse: 72  Resp: 18  Temp: 98.7 F (37.1 C)  TempSrc: Oral  Weight: 164 lb (74.4 kg)  Height: 5\' 9"  (1.753 m)   Body mass index is 24.22 kg/m.  Physical Exam  Constitutional: He appears well-developed.  Frail appearing in  NAD. He is deaf and uses eraser board to communicate. Lying in bed  HENT:  Mouth/Throat: Oropharynx is clear and moist. No oropharyngeal exudate.  MMM  Eyes: Pupils are equal, round, and reactive to light. No scleral icterus.  Neck: Neck supple. Carotid bruit is present (b/l systolic).  Cardiovascular: Normal rate,  regular rhythm and intact distal pulses.  Exam reveals no gallop and no friction rub.   Murmur (2/6 SEm ---> carotid b/l) heard. no distal LE swelling. No calf TTP  Pulmonary/Chest: Effort normal. He has no wheezes. He has no rales. He exhibits no tenderness.  Abdominal: Soft. Bowel sounds are normal. He exhibits no distension, no abdominal bruit, no pulsatile midline mass and no mass. There is no tenderness. There is no rebound and no guarding.  Musculoskeletal: He exhibits edema and tenderness.  Right medial knee bony deformity with swelling, TTP and reduced full extension. Reduced right hip internal rotation  Lymphadenopathy:    He has no cervical adenopathy.  Neurological: He is alert.  Skin: Skin is warm and dry. No rash noted.  Psychiatric: He has a normal mood and affect. His behavior is normal.     Labs reviewed: Nursing Home on 01/05/2016  Component Date Value Ref Range Status  . Hemoglobin 11/30/2015 9.9* 13.5 - 17.5 g/dL Final  . HCT 78/29/5621 31* 41 - 53 % Final  . Platelets 11/30/2015 279  150 - 399 K/L Final  . WBC 11/30/2015 5.7  10^3/mL Final  . Glucose 11/30/2015 87  mg/dL Final  . BUN 30/86/5784 37* 4 - 21 mg/dL Final  . Creatinine 69/62/9528 2.2* 0.6 - 1.3 mg/dL Final  . Potassium 41/32/4401 4.8  3.4 - 5.3 mmol/L Final  . Sodium 11/30/2015 140  137 - 147 mmol/L Final  . Triglycerides 11/30/2015 96  40 - 160 mg/dL Final  . Cholesterol 02/72/5366 144  0 - 200 mg/dL Final  . HDL 44/09/4740 31* 35 - 70 mg/dL Final  . LDL Cholesterol 11/30/2015 94  mg/dL Final  . Alkaline Phosphatase 11/30/2015 72  25 - 125 U/L Final  . ALT 11/30/2015 6* 10 - 40 U/L  Final  . AST 11/30/2015 10* 14 - 40 U/L Final  . Bilirubin, Total 11/30/2015 0.2  mg/dL Final  . TSH 59/56/3875 0.76  0.41 - 5.90 uIU/mL Final    No results found.   Assessment/Plan   ICD-9-CM ICD-10-CM   1. Allergic rhinitis, unspecified allergic rhinitis type - stable 477.9 J30.9   2. Dementia without behavioral disturbance 294.20 F03.90   3. Bilateral chronic knee pain 719.46 M25.561    338.29 M25.562     G89.29   4. Essential hypertension 401.9 I10   5. Chronic congestive heart failure, unspecified congestive heart failure type (HCC) 428.0 I50.9   6. Late effects of CVA (cerebrovascular accident) 438.9 I69.90   7. Hyperlipidemia 272.4 E78.5    Will need cmp, lipid panel, cbc labs next month  Cont current meds as ordered including aspercreme  PT/OT/ST as indicated  Repeat CXR if CHF sx's occur. follow VS  Will follow  Ignatz Deis S. Ancil Linsey  [facility] [address] [place], [place] [address] [phone] Cell (Monday-Friday 8 AM - 5 PM) [phone] After 5 PM and follow prompts

## 2016-02-17 ENCOUNTER — Encounter: Payer: Self-pay | Admitting: Gastroenterology

## 2016-03-09 ENCOUNTER — Encounter: Payer: Self-pay | Admitting: Nurse Practitioner

## 2016-03-09 ENCOUNTER — Non-Acute Institutional Stay (SKILLED_NURSING_FACILITY): Payer: Medicare Other | Admitting: Nurse Practitioner

## 2016-03-09 DIAGNOSIS — M25562 Pain in left knee: Secondary | ICD-10-CM | POA: Diagnosis not present

## 2016-03-09 DIAGNOSIS — N183 Chronic kidney disease, stage 3 unspecified: Secondary | ICD-10-CM

## 2016-03-09 DIAGNOSIS — G8929 Other chronic pain: Secondary | ICD-10-CM

## 2016-03-09 DIAGNOSIS — M25561 Pain in right knee: Secondary | ICD-10-CM | POA: Diagnosis not present

## 2016-03-09 DIAGNOSIS — F0390 Unspecified dementia without behavioral disturbance: Secondary | ICD-10-CM | POA: Diagnosis not present

## 2016-03-09 DIAGNOSIS — D509 Iron deficiency anemia, unspecified: Secondary | ICD-10-CM | POA: Diagnosis not present

## 2016-03-09 NOTE — Progress Notes (Signed)
Patient ID: Elijah Ramos, male   DOB: 1926-04-07, 82 y.o.   MRN: [id number]    Nursing Home Location:  [facility]  Place of Service: SNF (31)  PCP: Marga Melnick, MD  Allergies  Allergen Reactions  . Aleve [Naproxen Sodium]   . Beta Adrenergic Blockers   . Penicillins   . Sulfa Antibiotics     Chief Complaint  Patient presents with  . Medical Management of Chronic Issues    Routine Visit    HPI:  Patient is a 82 y.o. male seen today at [facility] for routine follow up on chronic conditions. Pt with a pmh of CKD, deafness, dementia, dysphagia, CVA, hx of DVT. Pt is very HOH making ROS difficult to obtain. In the last month pt was found to have hem positive stool after CBC revealed worsening anemia. GI consult has been placed and iron supplements added. Staff without acute concerns at this time. Pt reports ongoing pain to bilateral knees which he uses asper cream and oxycodone PRN.  No changes in diet noted.  Pt denies shortness of breath, chest pains, constipation or diarrhea. No acute changes in cognitive status. No changes in mood or behavior.   Review of Systems:  Review of Systems  Unable to perform ROS pt is severely HOH unable to perform ROS   Past Medical History:  Diagnosis Date  . Bilateral chronic knee pain   . CHF (congestive heart failure) (HCC)   . CKD (chronic kidney disease)   . Deafness, bilateral    reads lips  . Dementia without behavioral disturbance   . Dysphagia   . H/O acute respiratory failure   . H/O: alcohol abuse   . Hyperkalemia   . Hypertension   . Late effects of CVA (cerebrovascular accident)   . Personal history of DVT (deep vein thrombosis)   . PNA (pneumonia)    RLL  . Protein calorie malnutrition (HCC)   . Stroke [facility])    History reviewed. No pertinent surgical history. Social History:   has an unknown smoking status. He has never used smokeless tobacco. He reports that he does not drink alcohol or use  drugs.  History reviewed. No pertinent family history.  Medications: Patient's Medications  New Prescriptions   No medications on file  Previous Medications   ACIDOPHILUS (RISAQUAD) CAPS CAPSULE    Take 1 capsule by mouth 2 (two) times daily. For digestive health   ATORVASTATIN (LIPITOR) 10 MG TABLET    Take 5 mg by mouth daily. For CHF   CETIRIZINE (ZYRTEC ALLERGY) 10 MG TABLET    Take 10 mg by mouth at bedtime. For allergies   DOCUSATE SODIUM (COLACE) 100 MG CAPSULE    Take 100 mg by mouth daily. For constipation   DONEPEZIL (ARICEPT) 10 MG TABLET    Take 10 mg by mouth at bedtime. For dementia   FERROUS SULFATE 325 (65 FE) MG TABLET    Take 325 mg by mouth 2 (two) times daily with a meal.   FLUTICASONE (FLONASE) 50 MCG/ACT NASAL SPRAY    Place 2 sprays into both nostrils daily.    FOLIC ACID (FOLVITE) 1 MG TABLET    Take 1 mg by mouth daily.   MAGNESIUM HYDROXIDE (MILK OF MAGNESIA PO)    If no BM in 3 days, give 30 cc of milk of magnesium by mouth x 1 dose in 24 hour as needed   MEMANTINE (NAMENDA XR) 14 MG CP24 24 HR CAPSULE  Take 14 mg by mouth daily.   MULTIPLE MINERALS-VITAMINS PO    1 tablet by mouth for wound healing   NUTRITIONAL SUPPLEMENTS (ENSURE CLEAR) LIQD    Give 200 ml by mouth twice daily ( JUICE)   OXYCODONE (OXY-IR) 5 MG CAPSULE    Take 5 mg by mouth every 6 (six) hours as needed. For moderate to severe pain   POLYETHYLENE GLYCOL (MIRALAX / GLYCOLAX) PACKET    Take 17 g by mouth daily. For bowel health   SODIUM BICARBONATE 650 MG TABLET    Take 650 mg by mouth 2 (two) times daily. For chronic kidney disease   THIAMINE 100 MG TABLET    Take 100 mg by mouth daily. For supplement   TROLAMINE SALICYLATE (ASPERCREME) 10 % CREAM    Apply 1 application topically as needed for muscle pain (apply to both knees 3 times daily as needed).  Modified Medications   No medications on file  Discontinued Medications   No medications on file     Physical Exam: Vitals:   03/09/16  1324  BP: 130/72  Pulse: 63  Resp: 18  Temp: 97.4 F (36.3 C)  Weight: 164 lb (74.4 kg)  Height: 5\' 9"  (1.753 m)    Physical Exam  Constitutional: He appears well-developed and well-nourished. No distress.  HENT:  Head: Normocephalic and atraumatic.  Neck: Normal range of motion. Neck supple.  Cardiovascular: Normal rate, regular rhythm and normal heart sounds.   Pulmonary/Chest: Effort normal and breath sounds normal.  Abdominal: Soft. Bowel sounds are normal.  Musculoskeletal: He exhibits no edema or tenderness.  Neurological: He is alert.  Skin: Skin is warm and dry. He is not diaphoretic.  Psychiatric: He has a normal mood and affect.    Labs reviewed: Basic Metabolic Panel:  Recent Labs  16/04/9610/07/16 08/29/15 11/30/15  NA 141 141 140  K 5.3 4.8 4.8  BUN 38* 40* 37*  CREATININE 2.7* 3.0* 2.2*   Liver Function Tests:  Recent Labs  08/29/15 11/30/15  AST 11* 10*  ALT 6* 6*  ALKPHOS 86 72   No results for input(s): LIPASE, AMYLASE in the last 8760 hours. No results for input(s): AMMONIA in the last 8760 hours. CBC:  Recent Labs  08/29/15 11/30/15 02/08/16  WBC 6.0 5.7 6.3  HGB 12.1* 9.9* 11.3*  HCT 38* 31* 35*  PLT 249 279 235   TSH:  Recent Labs  11/30/15  TSH 0.76   A1C: No results found for: HGBA1C Lipid Panel:  Recent Labs  08/29/15 11/30/15  CHOL 197 144  HDL 5* 31*  LDLCALC 131 94  TRIG 139 96     Assessment/Plan 1. Anemia, iron deficiency -conts on iron supplements. hgb improved on recent CBC, GI consult   2. Bilateral chronic knee pain Chronic knee pain, conts on asper cream and oxy IR PRN  3. CKD (chronic kidney disease) stage 3, GFR 30-59 ml/min conts on sodium bicarbonate BID  4. Dementia without behavioral disturbance -without significant decline, conts on namenda and aricept   Meer Reindl K. Biagio Borgubanks, AGNP  [facility] [phone](Monday-Friday 8 am - 5 pm) [phone] (after hours)

## 2016-03-13 LAB — HEPATIC FUNCTION PANEL
ALK PHOS: 67 U/L (ref 25–125)
ALT: 8 U/L — AB (ref 10–40)
AST: 12 U/L — AB (ref 14–40)
BILIRUBIN, TOTAL: 0.2 mg/dL

## 2016-03-13 LAB — BASIC METABOLIC PANEL
BUN: 51 mg/dL — AB (ref 4–21)
Creatinine: 2.7 mg/dL — AB (ref 0.6–1.3)
Glucose: 93 mg/dL
Potassium: 5.4 mmol/L — AB (ref 3.4–5.3)
Sodium: 141 mmol/L (ref 137–147)

## 2016-03-30 ENCOUNTER — Non-Acute Institutional Stay (SKILLED_NURSING_FACILITY): Payer: Medicare Other | Admitting: Nurse Practitioner

## 2016-03-30 ENCOUNTER — Encounter: Payer: Self-pay | Admitting: Nurse Practitioner

## 2016-03-30 DIAGNOSIS — F0390 Unspecified dementia without behavioral disturbance: Secondary | ICD-10-CM | POA: Diagnosis not present

## 2016-03-30 DIAGNOSIS — F1011 Alcohol abuse, in remission: Secondary | ICD-10-CM

## 2016-03-30 DIAGNOSIS — I6990 Unspecified sequelae of unspecified cerebrovascular disease: Secondary | ICD-10-CM | POA: Diagnosis not present

## 2016-03-30 DIAGNOSIS — D509 Iron deficiency anemia, unspecified: Secondary | ICD-10-CM | POA: Diagnosis not present

## 2016-03-30 DIAGNOSIS — N183 Chronic kidney disease, stage 3 unspecified: Secondary | ICD-10-CM

## 2016-03-30 DIAGNOSIS — F1010 Alcohol abuse, uncomplicated: Secondary | ICD-10-CM

## 2016-03-30 DIAGNOSIS — J309 Allergic rhinitis, unspecified: Secondary | ICD-10-CM

## 2016-03-30 DIAGNOSIS — K5909 Other constipation: Secondary | ICD-10-CM

## 2016-03-30 NOTE — Progress Notes (Signed)
Patient ID: Elijah Ramos, male   DOB: June 26, 1926, 82 y.o.   MRN: [id number]    Nursing Home Location:  [facility]  Place of Service: SNF (31)  PCP: Marga Melnick, MD  Allergies  Allergen Reactions  . Aleve [Naproxen Sodium]   . Beta Adrenergic Blockers   . Penicillins   . Sulfa Antibiotics     Chief Complaint  Patient presents with  . Medical Management of Chronic Issues    Routine Visit    HPI:  Patient is a 82 y.o. male seen today at [facility] for routine follow up on chronic conditions. Pt with a pmh of CKD, deafness, dementia, dysphagia, CVA, hx of DVT. Pt is very HOH making ROS difficult to obtain. Pt has been stable in the last month. Reports fall but no injury noted. Pt has appt to GI due to worsening anemia and hem positive stools. Since iron was started hgb has improved.  Staff without any concerns and pt has no complaints.    Review of Systems:  Review of Systems  Unable to perform ROS pt is severely HOH unable to perform ROS   Past Medical History:  Diagnosis Date  . Bilateral chronic knee pain   . CHF (congestive heart failure) (HCC)   . CKD (chronic kidney disease)   . Deafness, bilateral    reads lips  . Dementia without behavioral disturbance   . Dysphagia   . H/O acute respiratory failure   . H/O: alcohol abuse   . Hyperkalemia   . Hypertension   . Late effects of CVA (cerebrovascular accident)   . Personal history of DVT (deep vein thrombosis)   . PNA (pneumonia)    RLL  . Protein calorie malnutrition (HCC)   . Stroke [facility])    History reviewed. No pertinent surgical history. Social History:   has an unknown smoking status. He has never used smokeless tobacco. He reports that he does not drink alcohol or use drugs.  History reviewed. No pertinent family history.  Medications: Patient's Medications  New Prescriptions   No medications on file  Previous Medications   ACIDOPHILUS (RISAQUAD) CAPS CAPSULE    Take 1 capsule  by mouth 2 (two) times daily. For digestive health   ATORVASTATIN (LIPITOR) 10 MG TABLET    Take 5 mg by mouth daily. For CHF   CETIRIZINE (ZYRTEC ALLERGY) 10 MG TABLET    Take 10 mg by mouth at bedtime. For allergies   DOCUSATE SODIUM (COLACE) 100 MG CAPSULE    Take 100 mg by mouth daily. For constipation   DONEPEZIL (ARICEPT) 10 MG TABLET    Take 10 mg by mouth at bedtime. For dementia   FERROUS SULFATE 325 (65 FE) MG TABLET    Take 325 mg by mouth 2 (two) times daily with a meal.   FLUTICASONE (FLONASE) 50 MCG/ACT NASAL SPRAY    Place 2 sprays into both nostrils daily.    FOLIC ACID (FOLVITE) 1 MG TABLET    Take 1 mg by mouth daily.   MAGNESIUM HYDROXIDE (MILK OF MAGNESIA PO)    If no BM in 3 days, give 30 cc of milk of magnesium by mouth x 1 dose in 24 hour as needed   MEMANTINE (NAMENDA XR) 14 MG CP24 24 HR CAPSULE    Take 14 mg by mouth daily.   MULTIPLE MINERALS-VITAMINS PO    1 tablet by mouth for wound healing   NUTRITIONAL SUPPLEMENTS (ENSURE CLEAR) LIQD  Give 200 ml by mouth twice daily ( JUICE)   OXYCODONE (OXY-IR) 5 MG CAPSULE    Take 5 mg by mouth every 6 (six) hours as needed. For moderate to severe pain   POLYETHYLENE GLYCOL (MIRALAX / GLYCOLAX) PACKET    Take 17 g by mouth daily. For bowel health   SODIUM BICARBONATE 650 MG TABLET    Take 650 mg by mouth 2 (two) times daily. For chronic kidney disease   THIAMINE 100 MG TABLET    Take 100 mg by mouth daily. For supplement   TROLAMINE SALICYLATE (ASPERCREME) 10 % CREAM    Apply 1 application topically as needed for muscle pain (apply to both knees 3 times daily as needed).  Modified Medications   No medications on file  Discontinued Medications   No medications on file     Physical Exam: Vitals:   03/30/16 1051  BP: 106/65  Pulse: 79  Resp: 19  Temp: 98.6 F (37 C)  Weight: 165 lb (74.8 kg)  Height: 5\' 9"  (1.753 m)    Physical Exam  Constitutional: He appears well-developed and well-nourished. No distress.    HENT:  Head: Normocephalic and atraumatic.  Neck: Normal range of motion. Neck supple.  Cardiovascular: Normal rate, regular rhythm and normal heart sounds.   Pulmonary/Chest: Effort normal and breath sounds normal.  Abdominal: Soft. Bowel sounds are normal.  Musculoskeletal: He exhibits no edema or tenderness.  Neurological: He is alert.  Skin: Skin is warm and dry. He is not diaphoretic.  Psychiatric: He has a normal mood and affect.    Labs reviewed: Basic Metabolic Panel:  Recent Labs  96/10/5400/13/17 11/30/15 03/13/16  NA 141 140 141  K 4.8 4.8 5.4*  BUN 40* 37* 51*  CREATININE 3.0* 2.2* 2.7*   Liver Function Tests:  Recent Labs  08/29/15 11/30/15 03/13/16  AST 11* 10* 12*  ALT 6* 6* 8*  ALKPHOS 86 72 67   No results for input(s): LIPASE, AMYLASE in the last 8760 hours. No results for input(s): AMMONIA in the last 8760 hours. CBC:  Recent Labs  08/29/15 11/30/15 02/08/16  WBC 6.0 5.7 6.3  HGB 12.1* 9.9* 11.3*  HCT 38* 31* 35*  PLT 249 279 235   TSH:  Recent Labs  11/30/15  TSH 0.76   A1C: No results found for: HGBA1C Lipid Panel:  Recent Labs  08/29/15 11/30/15  CHOL 197 144  HDL 5* 31*  LDLCALC 131 94  TRIG 139 96     Assessment/Plan 1. Late effects of CVA (cerebrovascular accident) Stable, no worsening of symptoms. ASA on hold due to hem positive stools   2. Anemia, iron deficiency -conts on ferrous sulfate BID, hem positive stools. appt on 04/23/16 to see GI (Dr Reece LevySpark)  3. H/O: alcohol abuse conts on thiamine and folic acid  4. Dementia without behavioral disturbance Stable, no major changes in cognitive or functional status. conts on aricept and namenda)   5. Other constipation Well controlled on miralax and colace   6. Allergic rhinitis, unspecified allergic rhinitis type Stable on zyrtec and flonase   7. CKD -worsening of renal disease noted on recent labs. Pt not on nephrotoxic medication and hydration encouraged from staff.     Janene HarveyJessica K. Biagio Borgubanks, AGNP  [facility] [phone](Monday-Friday 8 am - 5 pm) [phone] (after hours)

## 2016-04-12 IMAGING — CR DG CHEST 1V PORT
1 series · 1 of 1 positions shown · non-contrast
Comparison: Portable exam 5758 hr compared to 10/27/2013

CLINICAL DATA: Pneumonia, pleural effusion

EXAM:
PORTABLE CHEST - 1 VIEW

[AP]
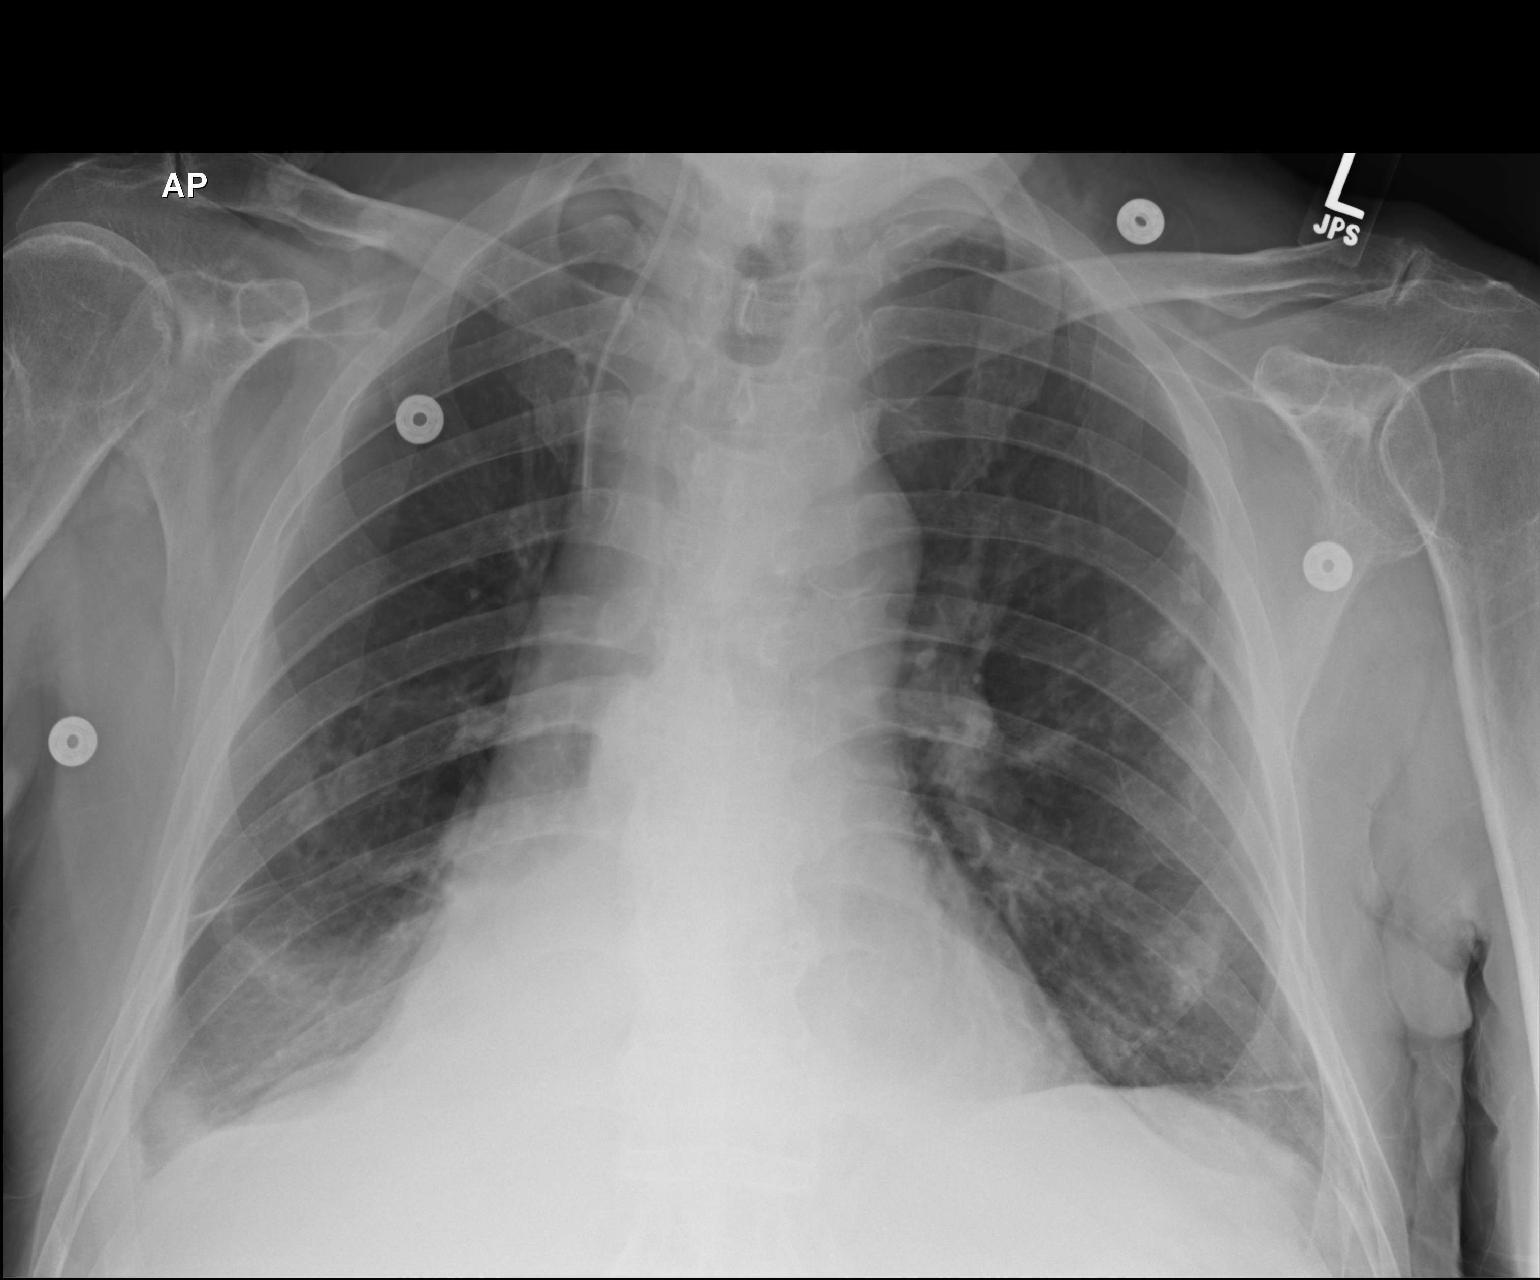

[1 of 1 positions shown; findings below may reference images not displayed]

FINDINGS: Tip of right jugular line projects over proximal SVC.

Stable heart size, mediastinal contours and pulmonary vascularity.

Calcified pleural plaques in left hemi thorax.

Minimal left basilar atelectasis.

Persistent right lower lobe atelectasis with partial atelectasis of
right middle lobe.

Small right pleural effusion.

No pneumothorax.

Volume loss in right chest.
IMPRESSION: Persistent atelectasis of right lower lobe and partial atelectasis
right middle lobe.

## 2016-04-17 IMAGING — US US RENAL
1 series · 14 of 25 positions shown · non-contrast
Comparison: CT ABD-PELV W/O CM dated 07/06/2009

CLINICAL DATA: Renal disease.

EXAM:
RENAL/URINARY TRACT ULTRASOUND COMPLETE

[Series 1: us renal · 0.22mm/px · 47 acquisitions, 14 frames shown]
[im 1/47]
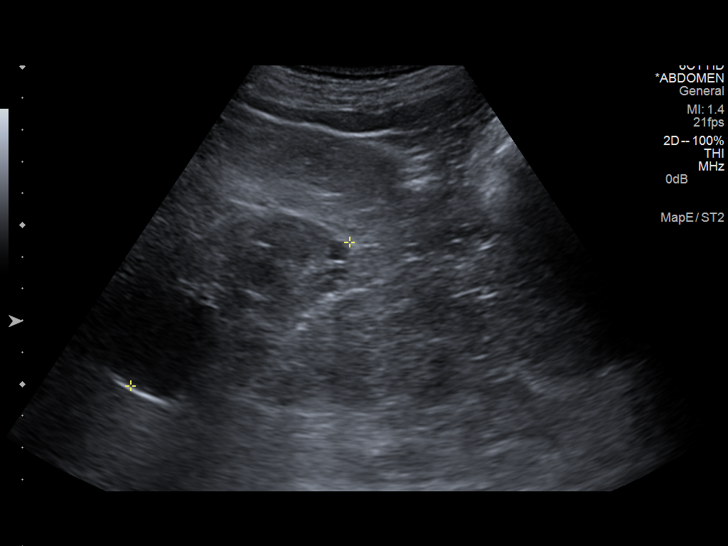
[im 4/47]
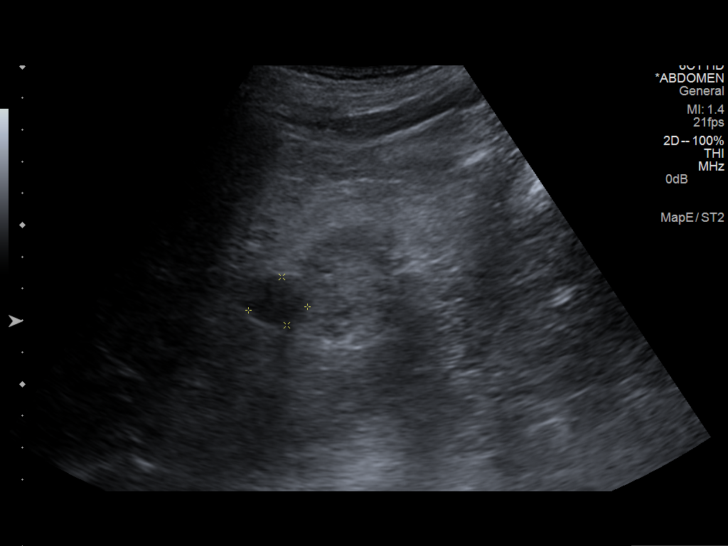
[im 8/47]
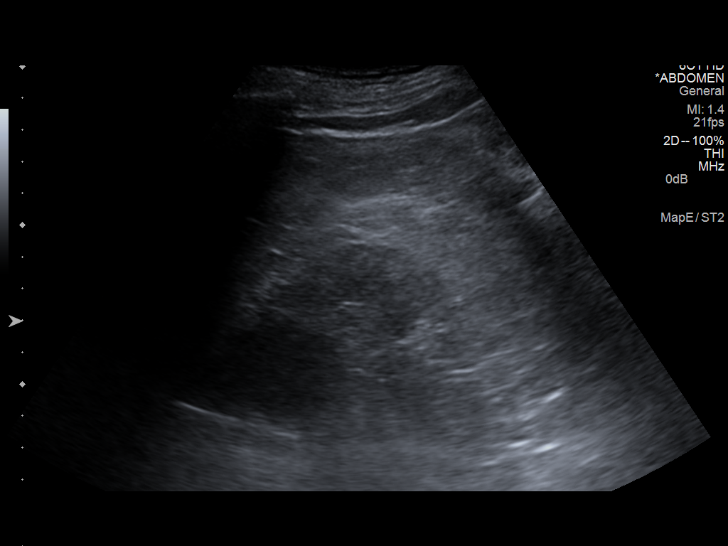
[im 12/47]
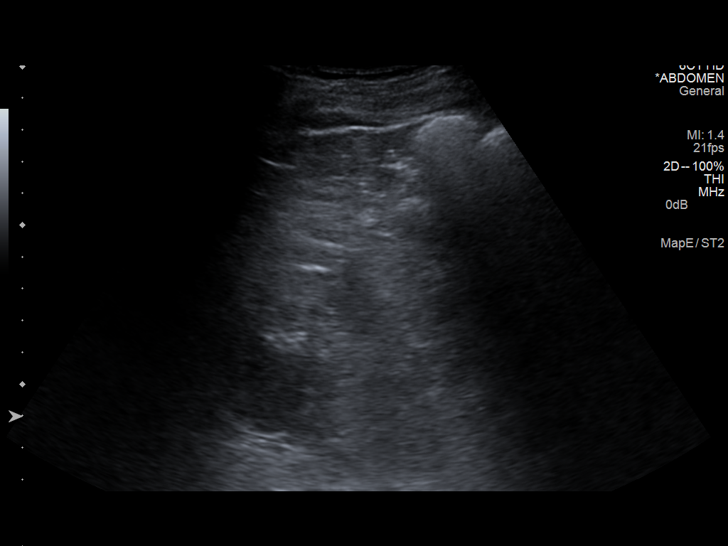
[im 16/47]
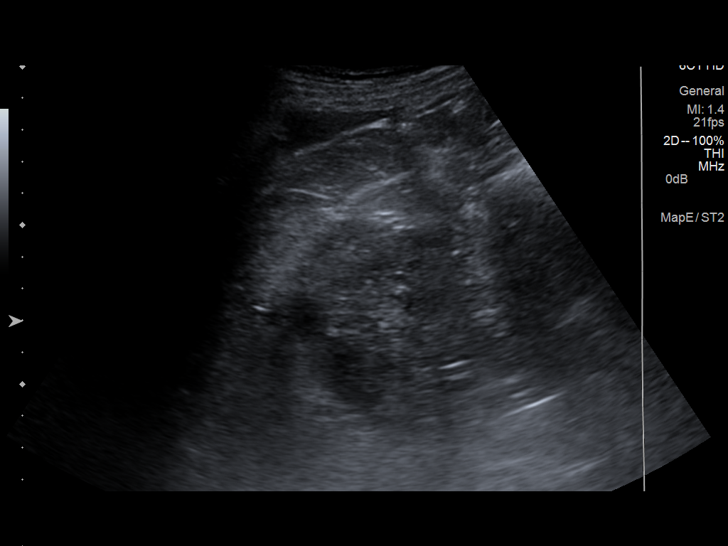
[im 18/47]
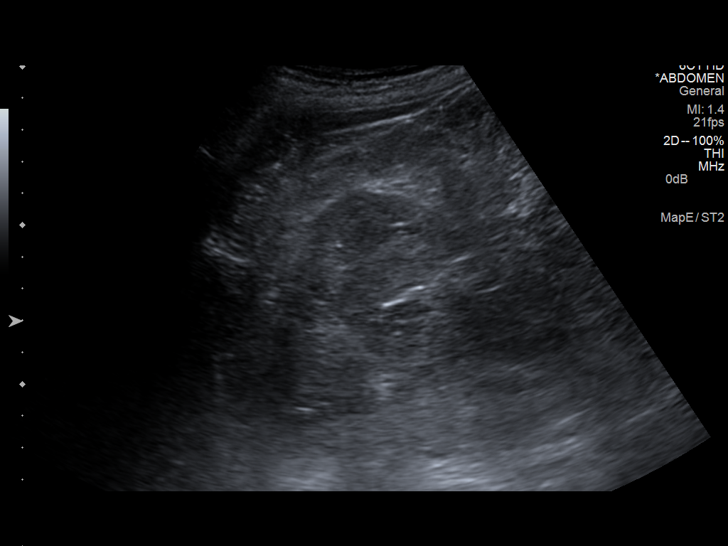
[im 22/47]
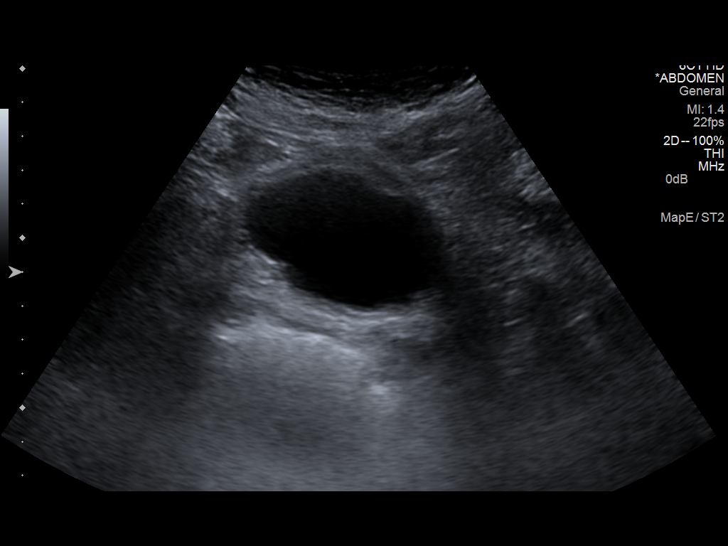
[im 25/47]
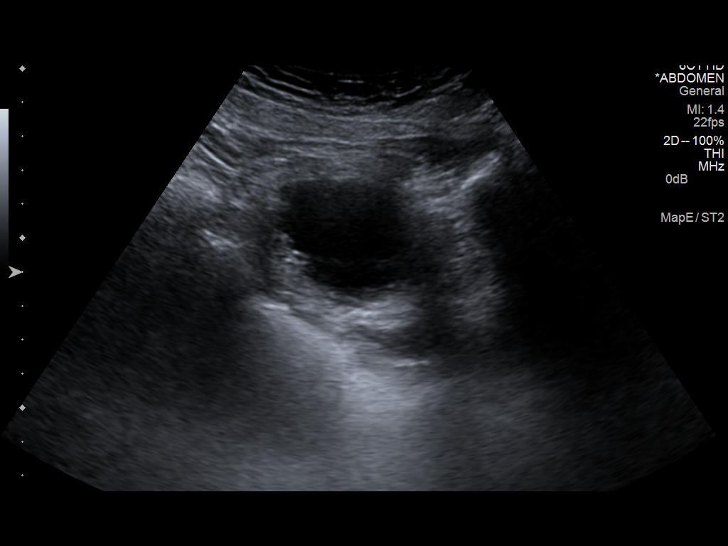
[im 29/47]
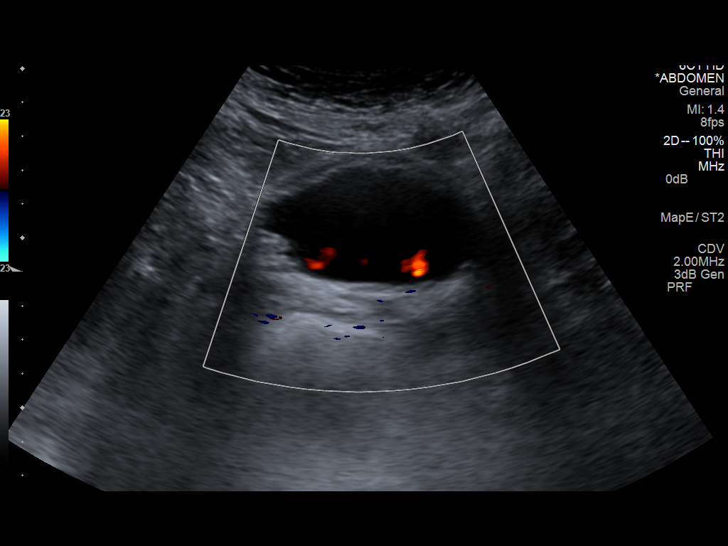
[im 31/47]
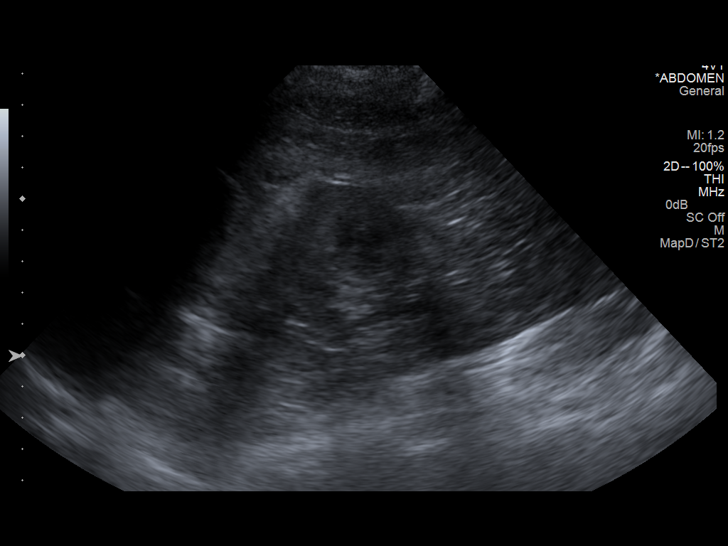
[im 35/47]
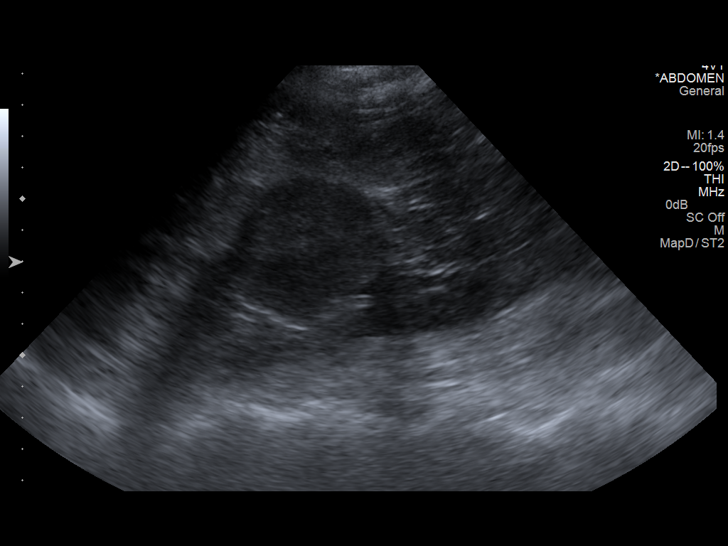
[im 39/47]
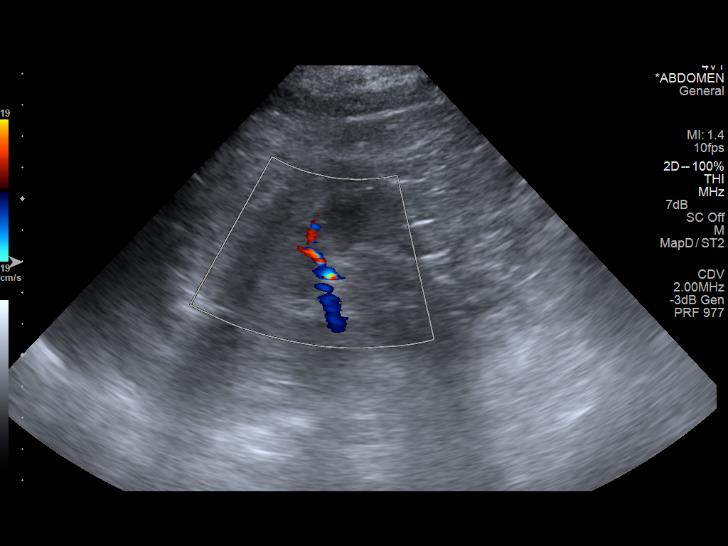
[im 43/47]
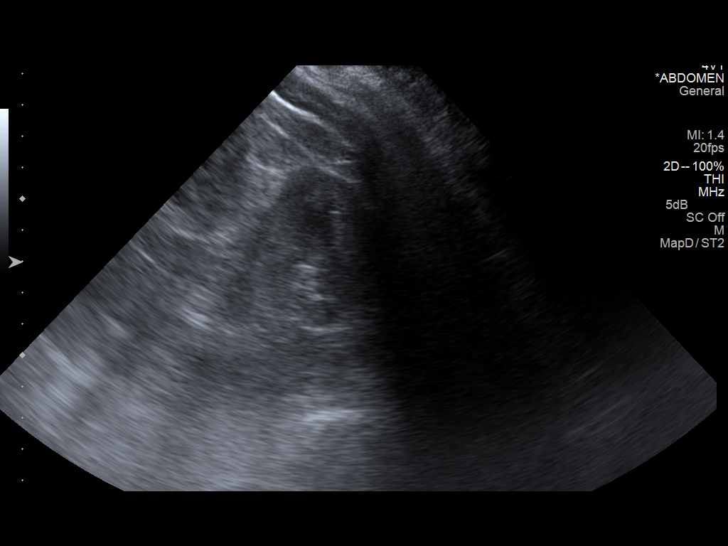
[im 47/47]
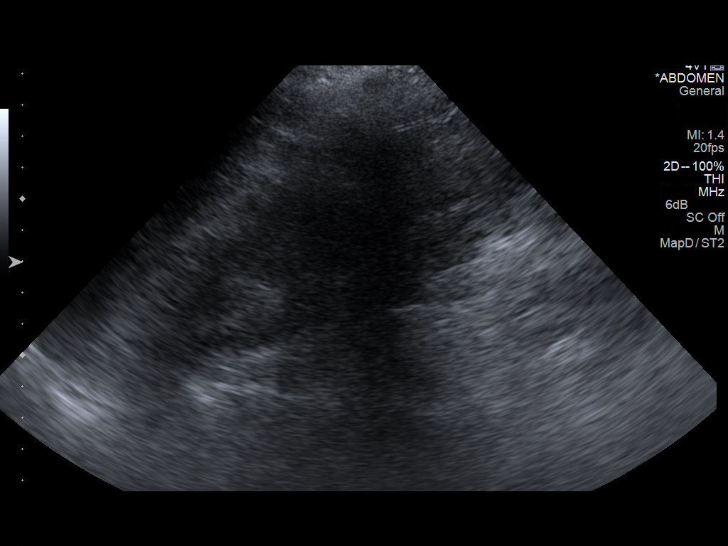

[14 of 25 positions shown; findings below may reference images not displayed]

FINDINGS: Right Kidney:

Length: 8.2 cm.. Right kidney is echogenic consistent chronic renal
disease. A 3.5 x 3.5 x 3.3 cm hypoechoic lesion right upper pole
noted most consistent with a cyst. An adjacent 1.9 x 1.5 x 0.7 cm
hypoechoic density is present again most likely a cyst. These
correspond to a simple cyst and hyperdense cyst noted in the regions
of the right kidney on prior CT of 3090.

Left Kidney:

Length: 4.5 cm.. Left in the past atrophy from prior CT scan. Left
kidney is echodense consistent with chronic renal disease.

Bladder:

Bladder is nondistended. The bladder wall however is irregular.
Bladder pathology including cystitis and/or malignancy cannot be
excluded.
IMPRESSION: 1. Bilateral echogenic kidneys with severe left renal atrophy. These
findings consistent with chronic renal disease. Cysts present in the
left kidney stable from prior CT of [DATE]. Bladder wall irregularity. Bladder wall pathology including
cystitis and/or malignancy cannot be excluded.

## 2016-04-23 ENCOUNTER — Encounter: Payer: Self-pay | Admitting: Gastroenterology

## 2016-04-23 ENCOUNTER — Encounter (INDEPENDENT_AMBULATORY_CARE_PROVIDER_SITE_OTHER): Payer: Self-pay

## 2016-04-23 ENCOUNTER — Ambulatory Visit (INDEPENDENT_AMBULATORY_CARE_PROVIDER_SITE_OTHER): Payer: Medicare Other | Admitting: Gastroenterology

## 2016-04-23 VITALS — BP 128/76 | HR 72 | Ht 69.0 in | Wt 165.0 lb

## 2016-04-23 DIAGNOSIS — D509 Iron deficiency anemia, unspecified: Secondary | ICD-10-CM | POA: Diagnosis not present

## 2016-04-23 DIAGNOSIS — R195 Other fecal abnormalities: Secondary | ICD-10-CM | POA: Diagnosis not present

## 2016-04-23 DIAGNOSIS — H9193 Unspecified hearing loss, bilateral: Secondary | ICD-10-CM | POA: Diagnosis not present

## 2016-04-23 NOTE — Patient Instructions (Signed)
Pease have Mr. Heidi DachCarter's POA (family member) contact our office and ask for Marchelle Folksmanda to reschedule his office visit to discuss Upper Endoscopy/ Colonoscopy.  Thank you for choosing me and [facility].  Venita LickMalcolm T. Pleas KochStark, Jr., MD., Clementeen GrahamFACG

## 2016-04-23 NOTE — Progress Notes (Signed)
    History of Present Illness: This is an 82 year old male referred by Kirt Boysarter, Monica, DO for the evaluation of heme pos stool and iron deficiency anemia. Reviewed records from [facility]. CBC on 02/08/2016 with Hb=11.3. Occult positive stool documented on 02/03/2016. Iron studies from 01/07/2016 show a low saturation=10%, a low iron=12, a TIBC=204 and a ferritin=10. Pt unable to hear. He is accompanied by a staff member from [facility]. We contacted [facility] several days prior to this appt and requested that his power of attorney be present at this appointment however they are not here. I was unable to communicate with him by voice. Staff member was unable to communicate with him by voice as well. Staff member states his hearing his substantially deteriorated over the past month. I was able to write and he was able to answer some questions however he was unable to respond to several simple written questions. I was unable to understand his verbal communication. He does not appear to have any active digestive symptoms. He denies any prior gastrointestinal evaluation such as a colonoscopy or EGD.  Review of Systems: Pertinent positive and negative review of systems were noted in the above HPI section. All other review of systems were otherwise negative.  Current Medications, Allergies, Past Medical History, Past Surgical History, Family History and Social History were reviewed in [facility] electronic medical record.  Physical Exam: General: Well developed, well nourished, elderly, frail, in wheelchair, no acute distress Head: Normocephalic and atraumatic Eyes:  sclerae anicteric, EOMI Ears: No auditory acuity Mouth: No deformity or lesions Neck: Supple, no masses or thyromegaly Lungs: Clear throughout to auscultation Heart: Regular rate and rhythm; no murmurs, rubs or bruits Abdomen: Soft, non tender and non distended. No masses, hepatosplenomegaly or hernias noted. Normal  Bowel sounds Rectal: deferred Musculoskeletal: Symmetrical with no gross deformities  Skin: No lesions on visible extremities Pulses:  Normal pulses noted Extremities: No clubbing, cyanosis, edema or deformities noted Neurological: Alert oriented x 4, deaf. Garbled speech. Cervical Nodes:  No significant cervical adenopathy Inguinal Nodes: No significant inguinal adenopathy Psychological:  Alert and cooperative. Normal mood and affect  Assessment and Recommendations:  1. Occult blood in stool. Probable iron deficiency anemia however TIBC was also low. R/O GI lesion such as an ulcer, AVM, colorectal neoplasm, etc. Unable to adequately communicate with the patient due to his inability to hear and his garbled speech. Power of attorney is not here to discuss risk and benefits of colonoscopy and upper endoscopy to further evaluate. [facility] is to [facility] and have further evaluation of his hearing and neurologic status prior to return GI appt. Reschedule GI appointment with POA if colonoscopy and EGD are under consideration by POA.  cc: Kirt BoysMonica Macbeth, DO [address] [place] [id number]

## 2016-04-27 ENCOUNTER — Encounter: Payer: Self-pay | Admitting: Nurse Practitioner

## 2016-04-27 ENCOUNTER — Non-Acute Institutional Stay (SKILLED_NURSING_FACILITY): Payer: Medicare Other | Admitting: Nurse Practitioner

## 2016-04-27 DIAGNOSIS — J309 Allergic rhinitis, unspecified: Secondary | ICD-10-CM

## 2016-04-27 DIAGNOSIS — N183 Chronic kidney disease, stage 3 unspecified: Secondary | ICD-10-CM

## 2016-04-27 DIAGNOSIS — I6990 Unspecified sequelae of unspecified cerebrovascular disease: Secondary | ICD-10-CM | POA: Diagnosis not present

## 2016-04-27 DIAGNOSIS — F0390 Unspecified dementia without behavioral disturbance: Secondary | ICD-10-CM

## 2016-04-27 DIAGNOSIS — G8929 Other chronic pain: Secondary | ICD-10-CM | POA: Diagnosis not present

## 2016-04-27 DIAGNOSIS — M25561 Pain in right knee: Secondary | ICD-10-CM | POA: Diagnosis not present

## 2016-04-27 DIAGNOSIS — D500 Iron deficiency anemia secondary to blood loss (chronic): Secondary | ICD-10-CM

## 2016-04-27 DIAGNOSIS — M25562 Pain in left knee: Secondary | ICD-10-CM

## 2016-04-27 LAB — CBC AND DIFFERENTIAL
HCT: 33 % — AB (ref 41–53)
HEMOGLOBIN: 10.4 g/dL — AB (ref 13.5–17.5)
PLATELETS: 197 10*3/uL (ref 150–399)
WBC: 4.8 10*3/mL

## 2016-04-27 NOTE — Progress Notes (Signed)
Patient ID: Elijah SarasWilliam R Wilshire, male   DOB: 11/23/1925, 82 y.o.   MRN: [id number]    Nursing Home [facility]  Place of Service: SNF (31)  PCP: Marga MelnickWilliam Hopper, MD  Allergies  Allergen Reactions  . Aleve [Naproxen Sodium]   . Beta Adrenergic Blockers   . Penicillins   . Sulfa Antibiotics     Chief Complaint  Patient presents with  . Medical Management of Chronic Issues    Routine Visit    HPI:  Patient is a 82 y.o. male [facility] follow up on chronic conditions. Pt with a pmh of CKD, deafness, dementia, dysphagia, CVA, hx of DVT. Pt is very HOH making ROS difficult to obtain. Pt saw GI on 10/9, there was no RP with pt during the visit but recommending colonoscopy/endoscopy based on lab findings and hem positive. Plan to reschedule appt to when RP/POA can attend appt or get consent for procedure.  Otherwise pt has been stable. Nursing staff has no concerns.  Review of Systems:  Review of Systems  Unable to perform ROS pt is severely HOH unable to perform ROS   Past Medical History:  Diagnosis Date  . Anemia   . Bilateral chronic knee pain   . CHF (congestive heart failure) (HCC)   . CKD (chronic kidney disease)   . Deafness, bilateral    reads lips  . Dementia without behavioral disturbance   . DVT (deep venous thrombosis) (HCC)   . Dysphagia   . H/O acute respiratory failure   . H/O: alcohol abuse   . Hyperkalemia   . Hypertension   . Late effects of CVA (cerebrovascular accident)   . Personal history of DVT (deep vein thrombosis)   . PNA (pneumonia)    RLL  . Protein calorie malnutrition (HCC)   . Stroke [facility](HCC)    History reviewed. No pertinent surgical history. Social History:   has an unknown smoking status. He has never used smokeless tobacco. He reports that he does not drink alcohol or use drugs.  History reviewed. No pertinent family history.  Medications: Patient's Medications  New Prescriptions   No  medications on file  Previous Medications   ATORVASTATIN (LIPITOR) 10 MG TABLET    Take 5 mg by mouth daily. For CHF   BISACODYL (DULCOLAX) 10 MG SUPPOSITORY    Place 10 mg rectally as needed for moderate constipation. 1 dose every 24h prn   CETIRIZINE (ZYRTEC ALLERGY) 10 MG TABLET    Take 10 mg by mouth at bedtime. For allergies   DOCUSATE SODIUM (COLACE) 100 MG CAPSULE    Take 100 mg by mouth daily. For constipation   DONEPEZIL (ARICEPT) 10 MG TABLET    Take 10 mg by mouth at bedtime. For dementia   FERROUS SULFATE 325 (65 FE) MG TABLET    Take 325 mg by mouth 2 (two) times daily with a meal.   FLUTICASONE (FLONASE) 50 MCG/ACT NASAL SPRAY    Place 2 sprays into both nostrils daily.    FOLIC ACID (FOLVITE) 1 MG TABLET    Take 1 mg by mouth daily.   MAGNESIUM HYDROXIDE (MILK OF MAGNESIA PO)    If no BM in 3 days, give 30 cc of milk of magnesium by mouth x 1 dose in 24 hour as needed   MEMANTINE (NAMENDA XR) 14 MG CP24 24 HR CAPSULE    Take 14 mg by mouth daily.   MULTIPLE MINERALS-VITAMINS PO  1 tablet by mouth for wound healing   NUTRITIONAL SUPPLEMENTS (ENSURE CLEAR) LIQD    Give 200 ml by mouth twice daily ( JUICE)   OXYCODONE (OXY-IR) 5 MG CAPSULE    Take 5 mg by mouth every 6 (six) hours as needed. For moderate to severe pain   POLYETHYLENE GLYCOL (MIRALAX / GLYCOLAX) PACKET    Take 17 g by mouth daily. For bowel health   SODIUM BICARBONATE 650 MG TABLET    Take 650 mg by mouth 2 (two) times daily. For chronic kidney disease   THIAMINE 100 MG TABLET    Take 100 mg by mouth daily. For supplement   TROLAMINE SALICYLATE (ASPERCREME) 10 % CREAM    Apply 1 application topically as needed for muscle pain (apply to both knees 3 times daily as needed).  Modified Medications   No medications on file  Discontinued Medications   ACIDOPHILUS (RISAQUAD) CAPS CAPSULE    Take 1 capsule by mouth 2 (two) times daily. For digestive health     Physical Exam: Vitals:   04/27/16 0943  BP: 128/65    Pulse: 72  Resp: 18  Temp: 98.7 F (37.1 C)  Weight: 162 lb 3.2 oz (73.6 kg)  Height: 5\' 9"  (1.753 m)    Physical Exam  Constitutional: He appears well-developed and well-nourished. No distress.  HENT:  Head: Normocephalic and atraumatic.  HOH  Neck: Normal range of motion. Neck supple.  Cardiovascular: Normal rate, regular rhythm and normal heart sounds.   Pulmonary/Chest: Effort normal and breath sounds normal.  Abdominal: Soft. Bowel sounds are normal.  Musculoskeletal: He exhibits no edema or tenderness.  Neurological: He is alert.  Skin: Skin is warm and dry. He is not diaphoretic.  Psychiatric: He has a normal mood and affect.    Labs reviewed: Basic Metabolic Panel:  Recent Labs  16/10/96 11/30/15 03/13/16  NA 141 140 141  K 4.8 4.8 5.4*  BUN 40* 37* 51*  CREATININE 3.0* 2.2* 2.7*   Liver Function Tests:  Recent Labs  08/29/15 11/30/15 03/13/16  AST 11* 10* 12*  ALT 6* 6* 8*  ALKPHOS 86 72 67   No results for input(s): LIPASE, AMYLASE in the last 8760 hours. No results for input(s): AMMONIA in the last 8760 hours. CBC:  Recent Labs  11/30/15 02/08/16 04/27/16  WBC 5.7 6.3 4.8  HGB 9.9* 11.3* 10.4*  HCT 31* 35* 33*  PLT 279 235 197   TSH:  Recent Labs  11/30/15  TSH 0.76   A1C: No results found for: HGBA1C Lipid Panel:  Recent Labs  08/29/15 11/30/15  CHOL 197 144  HDL 5* 31*  LDLCALC 131 94  TRIG 139 96     Assessment/Plan 1. Iron deficiency anemia due to chronic blood loss GI workup for possible ulcer, wanting to do colonoscopy/EGD once consent is obtained from POA. Cont iron twice daily and monitor hgb   2. Late effects of CVA (cerebrovascular accident) No longer on ASA due to hem positive stools, no progressive symptoms from CVA   3. Dementia without behavioral disturbance, unspecified dementia type Stable, no behaviors. conts on aricept and namenda  4. Allergic rhinitis, unspecified chronicity, unspecified seasonality,  unspecified trigger Stable on flonase and zyrtec  5. Bilateral chronic knee pain Stable conts on hydrocodone apap 5/325 mg a 6 hours PRN pain  6. CKD Progressive disease, conts on sodium bicarbonate, will follow up BMP with next labs   Jessica K. Biagio Borg  [facility] [phone]  8 am - 5 pm) [phone] (after hours)

## 2016-05-15 LAB — CBC AND DIFFERENTIAL
HCT: 33 % — AB (ref 41–53)
Hemoglobin: 10.2 g/dL — AB (ref 13.5–17.5)
Platelets: 237 10*3/uL (ref 150–399)
WBC: 5.9 10^3/mL

## 2016-05-15 LAB — BASIC METABOLIC PANEL
BUN: 45 mg/dL — AB (ref 4–21)
Creatinine: 2.6 mg/dL — AB (ref 0.6–1.3)
GLUCOSE: 107 mg/dL
Potassium: 5.5 mmol/L — AB (ref 3.4–5.3)
SODIUM: 141 mmol/L (ref 137–147)

## 2016-05-17 ENCOUNTER — Encounter: Payer: Self-pay | Admitting: *Deleted

## 2016-05-18 ENCOUNTER — Non-Acute Institutional Stay (SKILLED_NURSING_FACILITY): Payer: Medicare Other | Admitting: Nurse Practitioner

## 2016-05-18 ENCOUNTER — Encounter: Payer: Self-pay | Admitting: Nurse Practitioner

## 2016-05-18 DIAGNOSIS — J309 Allergic rhinitis, unspecified: Secondary | ICD-10-CM

## 2016-05-18 DIAGNOSIS — N183 Chronic kidney disease, stage 3 unspecified: Secondary | ICD-10-CM

## 2016-05-18 DIAGNOSIS — M1710 Unilateral primary osteoarthritis, unspecified knee: Secondary | ICD-10-CM

## 2016-05-18 DIAGNOSIS — D500 Iron deficiency anemia secondary to blood loss (chronic): Secondary | ICD-10-CM | POA: Diagnosis not present

## 2016-05-18 DIAGNOSIS — M179 Osteoarthritis of knee, unspecified: Secondary | ICD-10-CM

## 2016-05-18 NOTE — Progress Notes (Signed)
Patient ID: Elijah Ramos, male   DOB: 06/03/1926, 82 y.o.   MRN: [id number]    Nursing [facility]  Place of Service: SNF (31)  PCP: Marga MelnickWilliam Hopper, MD  Allergies  Allergen Reactions  . Aleve [Naproxen Sodium]   . Beta Adrenergic Blockers   . Penicillins   . Sulfa Antibiotics     Chief Complaint  Patient presents with  . Medical Management of Chronic Issues    Routine Visit    HPI:  Patient is a 82 y.o. male [facility] follow up on chronic conditions. Pt with a pmh of CKD, deafness, dementia, dysphagia, CVA, hx of DVT. Pt is very HOH making ROS difficult to obtain.  Pt with occult blood in stool, hgb remains stable on iron tablet. Pt has seen GI doctor however POA was not at appt so no plans for colonoscopy or EGD was made.   Review of Systems:  Review of Systems  Unable to perform ROS pt is severely HOH unable to perform ROS   Past Medical History:  Diagnosis Date  . Anemia   . Bilateral chronic knee pain   . CHF (congestive heart failure) (HCC)   . CKD (chronic kidney disease)   . Deafness, bilateral    reads lips  . Dementia without behavioral disturbance   . DVT (deep venous thrombosis) (HCC)   . Dysphagia   . H/O acute respiratory failure   . H/O: alcohol abuse   . Hyperkalemia   . Hypertension   . Late effects of CVA (cerebrovascular accident)   . Personal history of DVT (deep vein thrombosis)   . PNA (pneumonia)    RLL  . Protein calorie malnutrition (HCC)   . Stroke [facility](HCC)    History reviewed. No pertinent surgical history. Social History:   has an unknown smoking status. He has never used smokeless tobacco. He reports that he does not drink alcohol or use drugs.  History reviewed. No pertinent family history.  Medications: Patient's Medications  New Prescriptions   No medications on file  Previous Medications   ATORVASTATIN (LIPITOR) 10 MG TABLET    Take 5 mg by mouth daily. For CHF   BISACODYL (DULCOLAX) 10 MG SUPPOSITORY    Place 10 mg rectally as needed for moderate constipation. 1 dose every 24h prn   CETIRIZINE (ZYRTEC ALLERGY) 10 MG TABLET    Take 10 mg by mouth at bedtime. For allergies   DOCUSATE SODIUM (COLACE) 100 MG CAPSULE    Take 100 mg by mouth daily. For constipation   DONEPEZIL (ARICEPT) 10 MG TABLET    Take 10 mg by mouth at bedtime. For dementia   FERROUS SULFATE 325 (65 FE) MG TABLET    Take 325 mg by mouth 2 (two) times daily with a meal.   FLUTICASONE (FLONASE) 50 MCG/ACT NASAL SPRAY    Place 2 sprays into both nostrils daily.    FOLIC ACID (FOLVITE) 1 MG TABLET    Take 1 mg by mouth daily.   MAGNESIUM HYDROXIDE (MILK OF MAGNESIA PO)    If no BM in 3 days, give 30 cc of milk of magnesium by mouth x 1 dose in 24 hour as needed   MEMANTINE (NAMENDA XR) 14 MG CP24 24 HR CAPSULE    Take 14 mg by mouth daily.   MULTIPLE MINERALS-VITAMINS PO    1 tablet by mouth for wound healing   NUTRITIONAL SUPPLEMENTS (ENSURE CLEAR) LIQD  Give 200 ml by mouth twice daily ( JUICE)   OXYCODONE (OXY-IR) 5 MG CAPSULE    Take 5 mg by mouth every 6 (six) hours as needed. For moderate to severe pain   POLYETHYLENE GLYCOL (MIRALAX / GLYCOLAX) PACKET    Take 17 g by mouth daily. For bowel health   PROPYLENE GLYCOL (SYSTANE BALANCE OP)    Place 1 drop into both eyes 2 (two) times daily.   SODIUM BICARBONATE 650 MG TABLET    Take 650 mg by mouth 2 (two) times daily. For chronic kidney disease   THIAMINE 100 MG TABLET    Take 100 mg by mouth daily. For supplement   TROLAMINE SALICYLATE (ASPERCREME) 10 % CREAM    Apply 1 application topically as needed for muscle pain (apply to both knees 3 times daily as needed).  Modified Medications   No medications on file  Discontinued Medications   No medications on file     Physical Exam: Vitals:   05/18/16 1120  BP: 130/66  Pulse: 72  Resp: 18  Temp: 98.6 F (37 C)  Weight: 162 lb (73.5 kg)  Height: 5\' 9"  (1.753 m)    Physical  Exam  Constitutional: He appears well-developed and well-nourished. No distress.  HENT:  Head: Normocephalic and atraumatic.  HOH  Neck: Normal range of motion. Neck supple.  Cardiovascular: Normal rate, regular rhythm and normal heart sounds.   Pulmonary/Chest: Effort normal and breath sounds normal.  Abdominal: Soft. Bowel sounds are normal.  Musculoskeletal: He exhibits no edema or tenderness.  Neurological: He is alert.  Skin: Skin is warm and dry. He is not diaphoretic.  Psychiatric: He has a normal mood and affect.    Labs reviewed: Basic Metabolic Panel:  Recent Labs  09/81/1905/17/17 03/13/16 05/15/16  NA 140 141 141  K 4.8 5.4* 5.5*  BUN 37* 51* 45*  CREATININE 2.2* 2.7* 2.6*   Liver Function Tests:  Recent Labs  08/29/15 11/30/15 03/13/16  AST 11* 10* 12*  ALT 6* 6* 8*  ALKPHOS 86 72 67   No results for input(s): LIPASE, AMYLASE in the last 8760 hours. No results for input(s): AMMONIA in the last 8760 hours. CBC:  Recent Labs  02/08/16 04/27/16 05/15/16  WBC 6.3 4.8 5.9  HGB 11.3* 10.4* 10.2*  HCT 35* 33* 33*  PLT 235 197 237   TSH:  Recent Labs  11/30/15  TSH 0.76   A1C: No results found for: HGBA1C Lipid Panel:  Recent Labs  08/29/15 11/30/15  CHOL 197 144  HDL 5* 31*  LDLCALC 131 94  TRIG 139 96     Assessment/Plan 1. Rhinitis  Remains stable. conts on flonase, and zyrtec   2. Osteoarthritis of knee, unspecified laterality, unspecified osteoarthritis type Not using PRN medication, complaining of knee pain during visit. Will scheduled tylenol 650 mg BID for OA  3. CKD (chronic kidney disease) stage 3, GFR 30-59 ml/min -renal disease remains stable. Not on nephrotoxic medications  4. Iron deficiency anemia due to chronic blood loss hgb stable. conts on iron BID.    Janene HarveyJessica K. Biagio Borgubanks, AGNP  [facility] [phone](Monday-Friday 8 am - 5 pm) [phone] (after hours)

## 2016-06-20 ENCOUNTER — Non-Acute Institutional Stay (SKILLED_NURSING_FACILITY): Payer: Medicare Other | Admitting: Nurse Practitioner

## 2016-06-20 ENCOUNTER — Encounter: Payer: Self-pay | Admitting: Nurse Practitioner

## 2016-06-20 DIAGNOSIS — K5909 Other constipation: Secondary | ICD-10-CM

## 2016-06-20 DIAGNOSIS — N183 Chronic kidney disease, stage 3 unspecified: Secondary | ICD-10-CM

## 2016-06-20 DIAGNOSIS — M1710 Unilateral primary osteoarthritis, unspecified knee: Secondary | ICD-10-CM | POA: Diagnosis not present

## 2016-06-20 DIAGNOSIS — J309 Allergic rhinitis, unspecified: Secondary | ICD-10-CM

## 2016-06-20 DIAGNOSIS — M179 Osteoarthritis of knee, unspecified: Secondary | ICD-10-CM

## 2016-06-20 DIAGNOSIS — D500 Iron deficiency anemia secondary to blood loss (chronic): Secondary | ICD-10-CM

## 2016-06-20 NOTE — Progress Notes (Signed)
Patient ID: Elijah Ramos, male   DOB: 06/06/1926, 82 y.o.   MRN: [id number]    Nursing Home Location:  [facility]  Place of Service: SNF (31)  PCP: Marga MelnickWilliam Hopper, MD  Allergies  Allergen Reactions  . Aleve [Naproxen Sodium]   . Beta Adrenergic Blockers   . Penicillins   . Sulfa Antibiotics     Chief Complaint  Patient presents with  . Medical Management of Chronic Issues    Routine Visit    HPI:  Patient is a 82 y.o. male [facility] for routine follow up on chronic conditions. Pt with a pmh of CKD, deafness, dementia, dysphagia, CVA, hx of DVT. Pt reports he has ongoing runny nose, no increase in congestion or worsening of cough. Complaints of ongoing knee pain. Worse in right. States he feels weak at times however staff reports he is walking well with restorative going further than he has in the past. Staff reports he is staying in bed most of the time when he is not working with restorative, his does not appear to be new.  Denies depression or anxiety.  Sleeps well.  Moving bowels regularly.   Review of Systems:  Review of Systems  Unable to perform ROS pt is severely HOH unable to perform ROS   Past Medical History:  Diagnosis Date  . Anemia   . Bilateral chronic knee pain   . CHF (congestive heart failure) (HCC)   . CKD (chronic kidney disease)   . Deafness, bilateral    reads lips  . Dementia without behavioral disturbance   . DVT (deep venous thrombosis) (HCC)   . Dysphagia   . H/O acute respiratory failure   . H/O: alcohol abuse   . Hyperkalemia   . Hypertension   . Late effects of CVA (cerebrovascular accident)   . Personal history of DVT (deep vein thrombosis)   . PNA (pneumonia)    RLL  . Protein calorie malnutrition (HCC)   . Stroke [facility](HCC)    History reviewed. No pertinent surgical history. Social History:   has an unknown smoking status. He has never used smokeless tobacco. He reports that he does not drink alcohol  or use drugs.  History reviewed. No pertinent family history.  Medications: Patient's Medications  New Prescriptions   No medications on file  Previous Medications   ACETAMINOPHEN (TYLENOL) 325 MG TABLET    Take 650 mg by mouth every 12 (twelve) hours. scheduled   ATORVASTATIN (LIPITOR) 10 MG TABLET    Take 5 mg by mouth daily. For CHF   BISACODYL (DULCOLAX) 10 MG SUPPOSITORY    Place 10 mg rectally as needed for moderate constipation. 1 dose every 24h prn   CETIRIZINE (ZYRTEC ALLERGY) 10 MG TABLET    Take 10 mg by mouth at bedtime. For allergies   DOCUSATE SODIUM (COLACE) 100 MG CAPSULE    Take 100 mg by mouth daily. For constipation   DONEPEZIL (ARICEPT) 10 MG TABLET    Take 10 mg by mouth at bedtime. For dementia   FERROUS SULFATE 325 (65 FE) MG TABLET    Take 325 mg by mouth 2 (two) times daily with a meal.   FLUTICASONE (FLONASE) 50 MCG/ACT NASAL SPRAY    Place 2 sprays into both nostrils daily.    FOLIC ACID (FOLVITE) 1 MG TABLET    Take 1 mg by mouth daily.   MAGNESIUM HYDROXIDE (MILK OF MAGNESIA PO)    If no BM in  3 days, give 30 cc of milk of magnesium by mouth x 1 dose in 24 hour as needed   MEMANTINE (NAMENDA XR) 14 MG CP24 24 HR CAPSULE    Take 14 mg by mouth daily.   MULTIPLE MINERALS-VITAMINS PO    1 tablet by mouth for wound healing   NUTRITIONAL SUPPLEMENTS (ENSURE CLEAR) LIQD    Give 200 ml by mouth twice daily ( JUICE)   OXYCODONE (OXY-IR) 5 MG CAPSULE    Take 5 mg by mouth every 6 (six) hours as needed. For moderate to severe pain   POLYETHYLENE GLYCOL (MIRALAX / GLYCOLAX) PACKET    Take 17 g by mouth daily. For bowel health   PROPYLENE GLYCOL (SYSTANE BALANCE OP)    Place 1 drop into both eyes 2 (two) times daily.   SODIUM BICARBONATE 650 MG TABLET    Take 650 mg by mouth 2 (two) times daily. For chronic kidney disease   THIAMINE 100 MG TABLET    Take 100 mg by mouth daily. For supplement   TROLAMINE SALICYLATE (ASPERCREME) 10 % CREAM    Apply 1 application topically as  needed for muscle pain (apply to both knees 3 times daily as needed).  Modified Medications   No medications on file  Discontinued Medications   No medications on file     Physical Exam: Vitals:   06/20/16 1108  BP: 117/72  Pulse: (!) 58  Resp: 18  Temp: (!) 96.8 F (36 C)  SpO2: 96%  Weight: 162 lb (73.5 kg)  Height: 5\' 9"  (1.753 m)    Physical Exam  Constitutional: He appears well-developed and well-nourished. No distress.  HENT:  Head: Normocephalic and atraumatic.  HOH  Neck: Normal range of motion. Neck supple.  Cardiovascular: Normal rate, regular rhythm and normal heart sounds.   Pulmonary/Chest: Effort normal and breath sounds normal.  Abdominal: Soft. Bowel sounds are normal.  Musculoskeletal: He exhibits no edema or tenderness.  Neurological: He is alert.  Skin: Skin is warm and dry. He is not diaphoretic.  Psychiatric: He has a normal mood and affect.    Labs reviewed: Basic Metabolic Panel:  Recent Labs  16/10/96 03/13/16 05/15/16  NA 140 141 141  K 4.8 5.4* 5.5*  BUN 37* 51* 45*  CREATININE 2.2* 2.7* 2.6*   Liver Function Tests:  Recent Labs  08/29/15 11/30/15 03/13/16  AST 11* 10* 12*  ALT 6* 6* 8*  ALKPHOS 86 72 67   No results for input(s): LIPASE, AMYLASE in the last 8760 hours. No results for input(s): AMMONIA in the last 8760 hours. CBC:  Recent Labs  02/08/16 04/27/16 05/15/16  WBC 6.3 4.8 5.9  HGB 11.3* 10.4* 10.2*  HCT 35* 33* 33*  PLT 235 197 237   TSH:  Recent Labs  11/30/15  TSH 0.76   A1C: No results found for: HGBA1C Lipid Panel:  Recent Labs  08/29/15 11/30/15  CHOL 197 144  HDL 5* 31*  LDLCALC 131 94  TRIG 139 96     Assessment/Plan 1. Osteoarthritis of knee, unspecified laterality, unspecified osteoarthritis type Worsening pain in right knee, walking more. Will schedule asper cream BID and increase tylenol to q 8 hours.   2. CKD (chronic kidney disease) stage 3, GFR 30-59 ml/min Renal function  stable, conts on sodium bicarb  3. Iron deficiency anemia due to chronic blood loss hgb stable, conts on ferrous sulfate BID  4. Allergic rhinitis, unspecified chronicity, unspecified seasonality, unspecified trigger Ongoing rhinitis, conts on  flonase and zyrtec  5. Other constipation Well controlled on colace 100 mg daily  Elijah Ramos K. Biagio Borgubanks, AGNP  [facility] [phone](Monday-Friday 8 am - 5 pm) [phone] (after hours)

## 2016-07-26 ENCOUNTER — Other Ambulatory Visit: Payer: Self-pay

## 2016-07-26 MED ORDER — OXYCODONE HCL 5 MG PO CAPS: 5.0000 mg | ORAL_CAPSULE | Freq: Four times a day (QID) | ORAL | 0 refills | Status: DC | PRN

## 2016-07-26 NOTE — Telephone Encounter (Signed)
Prescription request was received from:    [facility] [address] [place] [address]  Phone: [phone] Fax: [phone] 

## 2016-07-31 ENCOUNTER — Other Ambulatory Visit: Payer: Self-pay | Admitting: *Deleted

## 2016-07-31 MED ORDER — OXYCODONE HCL 5 MG PO CAPS: 5.0000 mg | ORAL_CAPSULE | Freq: Four times a day (QID) | ORAL | 0 refills | Status: DC | PRN

## 2016-07-31 NOTE — Telephone Encounter (Signed)
[facility] [phone] Fax: [phone]  

## 2016-08-17 ENCOUNTER — Non-Acute Institutional Stay (SKILLED_NURSING_FACILITY): Payer: Medicare Other | Admitting: Nurse Practitioner

## 2016-08-17 DIAGNOSIS — H1033 Unspecified acute conjunctivitis, bilateral: Secondary | ICD-10-CM | POA: Diagnosis not present

## 2016-08-17 DIAGNOSIS — F0390 Unspecified dementia without behavioral disturbance: Secondary | ICD-10-CM

## 2016-08-17 DIAGNOSIS — M1710 Unilateral primary osteoarthritis, unspecified knee: Secondary | ICD-10-CM | POA: Diagnosis not present

## 2016-08-17 DIAGNOSIS — D500 Iron deficiency anemia secondary to blood loss (chronic): Secondary | ICD-10-CM

## 2016-08-17 DIAGNOSIS — H9193 Unspecified hearing loss, bilateral: Secondary | ICD-10-CM

## 2016-08-17 DIAGNOSIS — M179 Osteoarthritis of knee, unspecified: Secondary | ICD-10-CM

## 2016-08-17 NOTE — Progress Notes (Signed)
Patient ID: Elijah Ramos, male   DOB: July 27, 1925, 83 y.o.   MRN: [id number]    Nursing Home Location:  [facility] Room: 129 A  Place of Service: SNF (31)  PCP: Marga Melnick, MD  Allergies  Allergen Reactions  . Aleve [Naproxen Sodium]   . Beta Adrenergic Blockers   . Penicillins   . Sulfa Antibiotics     Chief Complaint  Patient presents with  . Medical Management of Chronic Issues    Routine Visit    HPI:  Patient is a 83 y.o. male seen today at [facility] for routine follow up on chronic conditions. Pt with a pmh of CKD, deafness, dementia, dysphagia, CVA, hx of DVT. ROS and HPI limited due to [facility] and dementia. Pt noted to have red eyes with minimal drainage. Reports occ itch. No pain or blurred visit.  There has been no acute issues in the last month. Nursing without acute concerns.  Review of Systems:  Review of Systems  Unable to perform ROS pt is severely HOH unable to perform ROS   Past Medical History:  Diagnosis Date  . Anemia   . Bilateral chronic knee pain   . CHF (congestive heart failure) (HCC)   . CKD (chronic kidney disease)   . Deafness, bilateral    reads lips  . Dementia without behavioral disturbance   . DVT (deep venous thrombosis) (HCC)   . Dysphagia   . H/O acute respiratory failure   . H/O: alcohol abuse   . Hyperkalemia   . Hypertension   . Late effects of CVA (cerebrovascular accident)   . Personal history of DVT (deep vein thrombosis)   . PNA (pneumonia)    RLL  . Protein calorie malnutrition (HCC)   . Stroke [facility])    No past surgical history on file. Social History:   has an unknown smoking status. He has never used smokeless tobacco. He reports that he does not drink alcohol or use drugs.  No family history on file.  Medications: Patient's Medications  New Prescriptions   No medications on file  Previous Medications   ACETAMINOPHEN (TYLENOL) 325 MG TABLET    Take 650 mg by mouth every 12 (twelve)  hours. scheduled   ATORVASTATIN (LIPITOR) 10 MG TABLET    Take 5 mg by mouth daily. For CHF   BISACODYL (DULCOLAX) 10 MG SUPPOSITORY    Place 10 mg rectally as needed for moderate constipation. 1 dose every 24h prn   CETIRIZINE (ZYRTEC ALLERGY) 10 MG TABLET    Take 10 mg by mouth at bedtime. For allergies   DOCUSATE SODIUM (COLACE) 100 MG CAPSULE    Take 100 mg by mouth daily. For constipation   FERROUS SULFATE 325 (65 FE) MG TABLET    Take 325 mg by mouth 2 (two) times daily with a meal.   FLUTICASONE (FLONASE) 50 MCG/ACT NASAL SPRAY    Place 2 sprays into both nostrils daily.    FOLIC ACID (FOLVITE) 1 MG TABLET    Take 1 mg by mouth daily.   MAGNESIUM HYDROXIDE (MILK OF MAGNESIA PO)    If no BM in 3 days, give 30 cc of milk of magnesium by mouth x 1 dose in 24 hour as needed   MEMANTINE HCL-DONEPEZIL HCL (NAMZARIC) 14-10 MG CP24    Take 1 tablet by mouth daily.   MULTIPLE MINERALS-VITAMINS PO    1 tablet by mouth for wound healing   NUTRITIONAL SUPPLEMENTS (ENSURE CLEAR) LIQD  Give 200 ml by mouth twice daily ( JUICE)   OXYCODONE (OXY-IR) 5 MG CAPSULE    Take 1 capsule (5 mg total) by mouth every 6 (six) hours as needed. For moderate to severe pain   POLYETHYLENE GLYCOL (MIRALAX / GLYCOLAX) PACKET    Take 17 g by mouth daily. For bowel health   PROPYLENE GLYCOL (SYSTANE BALANCE OP)    Place 1 drop into both eyes 2 (two) times daily.   SODIUM BICARBONATE 650 MG TABLET    Take 650 mg by mouth 2 (two) times daily. For chronic kidney disease   THIAMINE 100 MG TABLET    Take 100 mg by mouth daily. For supplement   TROLAMINE SALICYLATE (ASPERCREME) 10 % CREAM    Apply 1 application topically as needed for muscle pain (apply to both knees 3 times daily as needed).  Modified Medications   No medications on file  Discontinued Medications   DONEPEZIL (ARICEPT) 10 MG TABLET    Take 10 mg by mouth at bedtime. For dementia   MEMANTINE (NAMENDA XR) 14 MG CP24 24 HR CAPSULE    Take 14 mg by mouth daily.       Physical Exam: Vitals:   08/17/16 1436  BP: 130/60  Pulse: 76  Resp: 20  Temp: 97.3 F (36.3 C)  SpO2: 95%  Weight: 161 lb 6.4 oz (73.2 kg)  Height: 5\' 9"  (1.753 m)    Physical Exam  Constitutional: He appears well-developed and well-nourished. No distress.  HENT:  Head: Normocephalic and atraumatic.  HOH  Eyes: Pupils are equal, round, and reactive to light. Right conjunctiva is injected. Left conjunctiva is injected.  Neck: Normal range of motion. Neck supple.  Cardiovascular: Normal rate, regular rhythm and normal heart sounds.   Pulmonary/Chest: Effort normal and breath sounds normal.  Abdominal: Soft. Bowel sounds are normal.  Musculoskeletal: He exhibits no edema or tenderness.  Neurological: He is alert.  Skin: Skin is warm and dry. He is not diaphoretic.  Psychiatric: He has a normal mood and affect.    Labs reviewed: Basic Metabolic Panel:  Recent Labs  16/04/9604/17/17 03/13/16 05/15/16  NA 140 141 141  K 4.8 5.4* 5.5*  BUN 37* 51* 45*  CREATININE 2.2* 2.7* 2.6*   Liver Function Tests:  Recent Labs  08/29/15 11/30/15 03/13/16  AST 11* 10* 12*  ALT 6* 6* 8*  ALKPHOS 86 72 67   No results for input(s): LIPASE, AMYLASE in the last 8760 hours. No results for input(s): AMMONIA in the last 8760 hours. CBC:  Recent Labs  02/08/16 04/27/16 05/15/16  WBC 6.3 4.8 5.9  HGB 11.3* 10.4* 10.2*  HCT 35* 33* 33*  PLT 235 197 237   TSH:  Recent Labs  11/30/15  TSH 0.76   A1C: No results found for: HGBA1C Lipid Panel:  Recent Labs  08/29/15 11/30/15  CHOL 197 144  HDL 5* 31*  LDLCALC 131 94  TRIG 139 96     Assessment/Plan 1. Acute bacterial conjunctivitis of both eyes Ofloxacin 1 drop bilaterally q 4 hours x 2 days then q 6 hours x 5 days  2. Osteoarthritis of knee, unspecified laterality, unspecified osteoarthritis type Stable, cont current regimen  3. Iron deficiency anemia due to chronic blood loss -hem positive stool, family has opted  not to have this evaluated any further by GI, cont on iron  4. Dementia without behavioral disturbance, unspecified dementia type Stable on namzaric, without acute changes to cognitive or functional status  5. Deafness, bilateral Unchanged, staff to provide supportive care  Olon Russ K. Biagio Borg  [facility] [phone] 8 am - 5 pm) [phone] (after hours)

## 2016-09-14 ENCOUNTER — Non-Acute Institutional Stay (SKILLED_NURSING_FACILITY): Payer: Medicare Other | Admitting: Nurse Practitioner

## 2016-09-14 ENCOUNTER — Non-Acute Institutional Stay: Payer: Self-pay

## 2016-09-14 DIAGNOSIS — H9193 Unspecified hearing loss, bilateral: Secondary | ICD-10-CM | POA: Diagnosis not present

## 2016-09-14 DIAGNOSIS — F0390 Unspecified dementia without behavioral disturbance: Secondary | ICD-10-CM | POA: Diagnosis not present

## 2016-09-14 DIAGNOSIS — M1710 Unilateral primary osteoarthritis, unspecified knee: Secondary | ICD-10-CM

## 2016-09-14 DIAGNOSIS — M179 Osteoarthritis of knee, unspecified: Secondary | ICD-10-CM

## 2016-09-14 DIAGNOSIS — I10 Essential (primary) hypertension: Secondary | ICD-10-CM | POA: Diagnosis not present

## 2016-09-14 DIAGNOSIS — G4700 Insomnia, unspecified: Secondary | ICD-10-CM | POA: Insufficient documentation

## 2016-09-14 DIAGNOSIS — E46 Unspecified protein-calorie malnutrition: Secondary | ICD-10-CM

## 2016-09-14 NOTE — Progress Notes (Signed)
Patient ID: Elijah Ramos, male   DOB: 05-Oct-1925, 83 y.o.   MRN: [id number]    Nursing Home Location:  [facility] Room: 129 A  Place of Service: SNF (31)  PCP: Marga Melnick, MD  Allergies  Allergen Reactions  . Aleve [Naproxen Sodium]   . Beta Adrenergic Blockers   . Penicillins   . Sulfa Antibiotics     Chief Complaint  Patient presents with  . Medical Management of Chronic Issues    Resident is being seen for routine visit.     HPI:  Patient is a 83 y.o. male seen today at [facility] for routine follow up on chronic conditions. Pt with a pmh of CKD, deafness, dementia, dysphagia, CVA, hx of DVT. ROS and HPI limited due to [facility] and dementia. We were able to communicate with bedside writing board and staff RN input. Pt noted to be doing well over the last month. Conjunctivitis has cleared. No signs of redness or drainage. Pt has mild c/o right knee pain. No point tenderness, redness, or swelling on assessment. 4+ bilateral quadriceps strength, mild ROM limitation due to immobility, however, no facial grimacing with passive movement. He reports good appetite and normal bowel and bladder function. He admits to trouble sleeping and is requesting assistance for this. No other acute changes or complaints. Nursing without further concerns.   Review of Systems:  Review of Systems  Unable to perform ROS Pt is severely HOH unable to perform complete ROS with writing board   Past Medical History:  Diagnosis Date  . Anemia   . Bilateral chronic knee pain   . CHF (congestive heart failure) (HCC)   . CKD (chronic kidney disease)   . Deafness, bilateral    reads lips  . Dementia without behavioral disturbance   . DVT (deep venous thrombosis) (HCC)   . Dysphagia   . H/O acute respiratory failure   . H/O: alcohol abuse   . Hyperkalemia   . Hypertension   . Late effects of CVA (cerebrovascular accident)   . Personal history of DVT (deep vein thrombosis)   .  PNA (pneumonia)    RLL  . Protein calorie malnutrition (HCC)   . Stroke [facility])    History reviewed. No pertinent surgical history. Social History:   has an unknown smoking status. He has never used smokeless tobacco. He reports that he does not drink alcohol or use drugs.  History reviewed. No pertinent family history.  Medications: Patient's Medications  New Prescriptions   No medications on file  Previous Medications   ACETAMINOPHEN (TYLENOL) 325 MG TABLET    Take 650 mg by mouth every 12 (twelve) hours. scheduled   ATORVASTATIN (LIPITOR) 10 MG TABLET    Take 5 mg by mouth daily. For CHF   BISACODYL (DULCOLAX) 10 MG SUPPOSITORY    Place 10 mg rectally as needed for moderate constipation. 1 dose every 24h prn   CETIRIZINE (ZYRTEC ALLERGY) 10 MG TABLET    Take 10 mg by mouth at bedtime. For allergies   DOCUSATE SODIUM (COLACE) 100 MG CAPSULE    Take 100 mg by mouth daily. For constipation   FERROUS SULFATE 325 (65 FE) MG TABLET    Take 325 mg by mouth 2 (two) times daily with a meal.   FLUTICASONE (FLONASE) 50 MCG/ACT NASAL SPRAY    Place 2 sprays into both nostrils daily.    FOLIC ACID (FOLVITE) 1 MG TABLET    Take 1 mg by mouth daily.  MAGNESIUM HYDROXIDE (MILK OF MAGNESIA PO)    If no BM in 3 days, give 30 cc of milk of magnesium by mouth x 1 dose in 24 hour as needed   MEMANTINE HCL-DONEPEZIL HCL (NAMZARIC) 14-10 MG CP24    Take 1 tablet by mouth daily.   MULTIPLE MINERALS-VITAMINS PO    1 tablet by mouth for wound healing   NUTRITIONAL SUPPLEMENTS (ENSURE CLEAR) LIQD    Give 200 ml by mouth twice daily ( JUICE)   OXYCODONE (OXY-IR) 5 MG CAPSULE    Take 1 capsule (5 mg total) by mouth every 6 (six) hours as needed. For moderate to severe pain   POLYETHYLENE GLYCOL (MIRALAX / GLYCOLAX) PACKET    Take 17 g by mouth daily. For bowel health   PROPYLENE GLYCOL (SYSTANE BALANCE OP)    Place 1 drop into both eyes 2 (two) times daily.   SODIUM BICARBONATE 650 MG TABLET    Take 650 mg by  mouth 2 (two) times daily. For chronic kidney disease   THIAMINE 100 MG TABLET    Take 100 mg by mouth daily. For supplement   TROLAMINE SALICYLATE (ASPERCREME) 10 % CREAM    Apply 1 application topically as needed for muscle pain (apply to both knees 3 times daily as needed).  Modified Medications   No medications on file  Discontinued Medications   No medications on file     Physical Exam: Vitals:   09/14/16 0925  BP: 138/68  Pulse: (!) 55  Resp: 16  Temp: 98.6 F (37 C)  SpO2: 94%  Weight: 163 lb (73.9 kg)  Height: 5\' 9"  (1.753 m)    Physical Exam  Constitutional: He appears well-developed and well-nourished. No distress.  HENT:  Head: Normocephalic and atraumatic.  HOH  Eyes: Pupils are equal, round, and reactive to light. Right eye exhibits no discharge. Left eye exhibits no discharge.  Neck: Normal range of motion. Neck supple. No JVD present.  Cardiovascular: Normal rate, regular rhythm and normal heart sounds.   Pulses:      Radial pulses are 2+ on the right side, and 2+ on the left side.       Dorsalis pedis pulses are 1+ on the right side, and 1+ on the left side.  Pulmonary/Chest: Effort normal and breath sounds normal. No respiratory distress. He has no wheezes.  Abdominal: Soft. Bowel sounds are normal. He exhibits no distension. There is no tenderness. There is no guarding.  Musculoskeletal: He exhibits no edema or tenderness.       Right knee: He exhibits decreased range of motion. He exhibits no swelling, no effusion, no ecchymosis, no erythema and no bony tenderness. No tenderness found. No medial joint line and no lateral joint line tenderness noted.       Left knee: He exhibits decreased range of motion. He exhibits no swelling, no effusion, no ecchymosis, no erythema and no bony tenderness. No tenderness found. No medial joint line and no lateral joint line tenderness noted.  Neurological: He is alert. He displays no atrophy. He exhibits normal muscle tone.    Alert to person, and time. Disoriented to place.   Skin: Skin is warm and dry. He is not diaphoretic.  Psychiatric: He has a normal mood and affect.    Labs reviewed: Basic Metabolic Panel:  Recent Labs  81/19/14 03/13/16 05/15/16  NA 140 141 141  K 4.8 5.4* 5.5*  BUN 37* 51* 45*  CREATININE 2.2* 2.7* 2.6*   Liver  Function Tests:  Recent Labs  11/30/15 03/13/16  AST 10* 12*  ALT 6* 8*  ALKPHOS 72 67   No results for input(s): LIPASE, AMYLASE in the last 8760 hours. No results for input(s): AMMONIA in the last 8760 hours. CBC:  Recent Labs  02/08/16 04/27/16 05/15/16  WBC 6.3 4.8 5.9  HGB 11.3* 10.4* 10.2*  HCT 35* 33* 33*  PLT 235 197 237   TSH:  Recent Labs  11/30/15  TSH 0.76   A1C: No results found for: HGBA1C Lipid Panel:  Recent Labs  11/30/15  CHOL 144  HDL 31*  LDLCALC 94  TRIG 96     Assessment/Plan   1. Protein-calorie malnutrition, unspecified severity (HCC) -Stable. Receiving nutrition supplements. Weight stable at 163lb today, last two readings= 161lb, 162 lb  2. Deafness, bilateral -Stable. Able to use writing board supplied by speech therapy for communication, although remains limited.   3. Dementia without behavioral disturbance, unspecified dementia type -Stable, on Namzaric. No acute changes to cognitive or functional status  4. Osteoarthritis of knee, unspecified laterality, unspecified osteoarthritis type -Stable. Continues to have discomfort.  -Currently using Aspercreme application, tylenol, and oxycodone as needed for pain. Continue with physical therapy and movement maintenance as tolerated to prevent further compensation.  -Will continue to monitor, sooner if needed  5. Insomnia-  -Will add melatonin to medication regimen as needed for c/o insomina -Will have staff monitor for changes in behavior or oversedation -Will follow next month for improvements, or sooner if needed   Naz Denunzio K. Biagio Borgubanks, AGNP  [facility]  Adult Medicine [phone](Monday-Friday 8 am - 5 pm) [phone] (after hours)

## 2016-09-20 ENCOUNTER — Encounter: Payer: Self-pay | Admitting: Internal Medicine

## 2016-09-20 ENCOUNTER — Non-Acute Institutional Stay (SKILLED_NURSING_FACILITY): Payer: Medicare Other | Admitting: Internal Medicine

## 2016-09-20 DIAGNOSIS — D649 Anemia, unspecified: Secondary | ICD-10-CM

## 2016-09-20 DIAGNOSIS — S0512XA Contusion of eyeball and orbital tissues, left eye, initial encounter: Secondary | ICD-10-CM

## 2016-09-20 DIAGNOSIS — S0012XA Contusion of left eyelid and periocular area, initial encounter: Secondary | ICD-10-CM | POA: Diagnosis not present

## 2016-09-20 NOTE — Patient Instructions (Signed)
See assessment and plan under each diagnosis acutely for this visit  

## 2016-09-20 NOTE — Progress Notes (Signed)
    Facility Location: [facility]  Room Number: 129-A  Code Status: Full Code   This is a nursing facility follow up for specific acute issue of bruise above L eye.  Interim medical record and care since last [facility] visit was updated with review of diagnostic studies and change in clinical status since last visit were documented.  HPI: Last night staff noted a bruise above the left eye involving the the lid with purplish/red discoloration. Excess tearing was noted. He was treated for acute bacterial conjunctivitis of both eyes in early February for which he received antibacterial eyedrops for 1 week. His last hemoglobin and hematocrit revealed a stable anemia. His platelet count was normal. No indices were documented. He is on folic acid. B12 level was low normal at 304 in 2015. He has oxycodone 5 mg every 6 hours listed as needed for osteoarthritic pain. He is unable to relate whether he is having pain due to his dementia.  Review of systems: Dementia invalidated responses. Patient is deaf.  Whiteboard attempted for communication, but patient did not respond to written questions.    Physical exam:  Pertinent or positive findings:Bilateral ptosis, greater on left than right.  Bilateral ectropion, greater on left than right. Pin point pupils present. Purple bruise on left medial eyelid and down the edge of the nose. The pattern suggests this might be related to trauma from his glasses. Vascular nevus on right lower lip.  Teeth absent.  Contractures of left fifth digit and all digits of right hand.   Heart sounds distant.  Systolic murmur, grade 1. 5-2 right base.  Trace pitting edema of bilateral lower extremities. Decreased pedal pulses. Lung sounds decreased in bases.     General appearance:Adequately nourished; no acute distress , increased work of breathing is present.   Lymphatic: No lymphadenopathy about the head, neck, axilla . Eyes:   There is no scleral icterus. Ears:  External ear exam shows no significant lesions or deformities.   Nose:  External nasal examination shows no deformity or inflammation. Nasal mucosa are pink and moist without lesions ,exudates Oral exam:  Gums are healthy appearing.There is no oropharyngeal erythema or exudate . Neck:  No thyromegaly, masses, tenderness noted.    Heart:  Normal rate and regular rhythm. S1 and S2 normal without gallop, click, rub .  Lungs:Chest wheezes, rhonchi,rales , rubs. Abdomen:Bowel sounds are normal. Abdomen is soft and nontender with no organomegaly, hernias,masses. GU: deferred  Extremities:  No cyanosis, clubbing Neurologic exam : Cn 2-6 intact Strength equal  in upper & lower extremities Balance,Rhomberg,finger to nose testing could not be completed due to clinical state Skin: Warm & dry w/o tenting. No significant rash.  #1 pattern bruising around the left medial eye suggesting trauma from eyeglasses; clinically no suggestion of a bleeding dyscrasia. He is on no medications or supplements that should induce such #2 narcotic administration for osteoarthritis #3 dementia with inability to validate pain Plan: CBC and differential Wean and discontinue the narcotic whose risk outweigh any benefits

## 2016-09-21 ENCOUNTER — Other Ambulatory Visit: Payer: Self-pay

## 2016-09-21 ENCOUNTER — Encounter: Payer: Self-pay | Admitting: Nurse Practitioner

## 2016-09-21 MED ORDER — OXYCODONE HCL 5 MG PO CAPS: 5.0000 mg | ORAL_CAPSULE | Freq: Three times a day (TID) | ORAL | 0 refills | Status: DC | PRN

## 2016-09-21 NOTE — Addendum Note (Signed)
Addended by: Chriss DriverLANE, TERRY L on: 09/21/2016 02:55 PM   Modules accepted: Level of Service

## 2016-09-21 NOTE — Telephone Encounter (Signed)
Prescription request was received from:    [facility] [address] [place] [address]  Phone: [phone] Fax: [phone] 

## 2016-09-21 NOTE — Progress Notes (Signed)
Opened in error

## 2016-09-21 NOTE — Progress Notes (Signed)
Patient ID: Elijah Ramos, male   DOB: [birth year], 83 y.o.   MRN: [id number]    Nursing Home Location:  [facility] Room: 129 A  Place of Service: SNF (31)  PCP: Marga Melnick, MD  Allergies  Allergen Reactions  . Aleve [Naproxen Sodium]   . Beta Adrenergic Blockers   . Penicillins   . Sulfa Antibiotics     Chief Complaint  Patient presents with  . Medical Management of Chronic Issues    Resident is being seen for routine visit.    HPI:  Patient is a 83 y.o. male seen today at [facility] for routine follow up on chronic conditions. Pt with a pmh of CKD, deafness, dementia, dysphagia, CVA, hx of DVT. ROS and HPI limited due to [facility] and dementia. We were able to communicate with bedside writing board and staff RN input. Pt noted to be doing well over the last month. Conjunctivitis has cleared. No signs of redness or drainage. Pt has mild c/o right knee pain. No point tenderness, redness, or swelling on assessment. 4+ bilateral quadriceps strength, mild ROM limitation due to immobility, however, no facial grimacing with passive movement. He reports good appetite and normal bowel and bladder function. He admits to trouble sleeping and is requesting assistance for this. No other acute changes or complaints. Nursing without further concerns.   Review of Systems:  Review of Systems  Unable to perform ROS Pt is severely HOH unable to perform complete ROS with writing board   Past Medical History:  Diagnosis Date  . Anemia   . Bilateral chronic knee pain   . CHF (congestive heart failure) (HCC)   . CKD (chronic kidney disease)   . Deafness, bilateral    reads lips  . Dementia without behavioral disturbance   . DVT (deep venous thrombosis) (HCC)   . Dysphagia   . H/O acute respiratory failure   . H/O: alcohol abuse   . Hyperkalemia   . Hypertension   . Late effects of CVA (cerebrovascular accident)   . Personal history of DVT (deep vein thrombosis)   .  PNA (pneumonia)    RLL  . Protein calorie malnutrition (HCC)   . Stroke [facility])    History reviewed. No pertinent surgical history. Social History:   has an unknown smoking status. He has never used smokeless tobacco. He reports that he does not drink alcohol or use drugs.  History reviewed. No pertinent family history.  Medications: Patient's Medications  New Prescriptions   No medications on file  Previous Medications   ACETAMINOPHEN (TYLENOL) 325 MG TABLET    Take 650 mg by mouth every 12 (twelve) hours. scheduled   ATORVASTATIN (LIPITOR) 10 MG TABLET    Take 5 mg by mouth daily. For CHF   BISACODYL (DULCOLAX) 10 MG SUPPOSITORY    Place 10 mg rectally as needed for moderate constipation. 1 dose every 24h prn   CETIRIZINE (ZYRTEC ALLERGY) 10 MG TABLET    Take 10 mg by mouth at bedtime. For allergies   DOCUSATE SODIUM (COLACE) 100 MG CAPSULE    Take 100 mg by mouth daily. For constipation   FERROUS SULFATE 325 (65 FE) MG TABLET    Take 325 mg by mouth 2 (two) times daily with a meal.   FLUTICASONE (FLONASE) 50 MCG/ACT NASAL SPRAY    Place 2 sprays into both nostrils daily.    FOLIC ACID (FOLVITE) 1 MG TABLET    Take 1 mg by mouth daily.  MAGNESIUM HYDROXIDE (MILK OF MAGNESIA PO)    If no BM in 3 days, give 30 cc of milk of magnesium by mouth x 1 dose in 24 hour as needed   MELATONIN 3 MG TABS    Take by mouth at bedtime as needed.   MEMANTINE HCL-DONEPEZIL HCL (NAMZARIC) 14-10 MG CP24    Take 1 tablet by mouth daily.   MULTIPLE MINERALS-VITAMINS PO    1 tablet by mouth for wound healing   NUTRITIONAL SUPPLEMENTS (ENSURE CLEAR) LIQD    Give 200 ml by mouth twice daily ( JUICE)   OXYCODONE (OXY-IR) 5 MG CAPSULE    Take 1 capsule (5 mg total) by mouth every 8 (eight) hours as needed. For moderate to severe pain   POLYETHYLENE GLYCOL (MIRALAX / GLYCOLAX) PACKET    Take 17 g by mouth daily. For bowel health   PROPYLENE GLYCOL (SYSTANE BALANCE OP)    Place 1 drop into both eyes 2 (two) times  daily.   SODIUM BICARBONATE 650 MG TABLET    Take 650 mg by mouth 2 (two) times daily. For chronic kidney disease   THIAMINE 100 MG TABLET    Take 100 mg by mouth daily. For supplement   TROLAMINE SALICYLATE (ASPERCREME) 10 % CREAM    Apply 1 application topically as needed for muscle pain (apply to both knees 3 times daily as needed).  Modified Medications   No medications on file  Discontinued Medications   No medications on file     Physical Exam: Vitals:   09/14/16 1443  BP: 138/68  Pulse: (!) 55  Resp: 16  Temp: 98.6 F (37 C)  SpO2: 94%  Weight: 163 lb (73.9 kg)  Height: 5\' 9"  (1.753 m)    Physical Exam  Constitutional: He appears well-developed and well-nourished. No distress.  HENT:  Head: Normocephalic and atraumatic.  HOH  Eyes: Pupils are equal, round, and reactive to light. Right eye exhibits no discharge. Left eye exhibits no discharge.  Neck: Normal range of motion. Neck supple. No JVD present.  Cardiovascular: Normal rate, regular rhythm and normal heart sounds.   Pulses:      Radial pulses are 2+ on the right side, and 2+ on the left side.       Dorsalis pedis pulses are 1+ on the right side, and 1+ on the left side.  Pulmonary/Chest: Effort normal and breath sounds normal. No respiratory distress. He has no wheezes.  Abdominal: Soft. Bowel sounds are normal. He exhibits no distension. There is no tenderness. There is no guarding.  Musculoskeletal: He exhibits no edema or tenderness.       Right knee: He exhibits decreased range of motion. He exhibits no swelling, no effusion, no ecchymosis, no erythema and no bony tenderness. No tenderness found. No medial joint line and no lateral joint line tenderness noted.       Left knee: He exhibits decreased range of motion. He exhibits no swelling, no effusion, no ecchymosis, no erythema and no bony tenderness. No tenderness found. No medial joint line and no lateral joint line tenderness noted.  Neurological: He is  alert. He displays no atrophy. He exhibits normal muscle tone.  Alert to person, and time. Disoriented to place.   Skin: Skin is warm and dry. He is not diaphoretic.  Psychiatric: He has a normal mood and affect.    Labs reviewed: Basic Metabolic Panel:  Recent Labs  78/29/56 03/13/16 05/15/16  NA 140 141 141  K 4.8  5.4* 5.5*  BUN 37* 51* 45*  CREATININE 2.2* 2.7* 2.6*   Liver Function Tests:  Recent Labs  11/30/15 03/13/16  AST 10* 12*  ALT 6* 8*  ALKPHOS 72 67   No results for input(s): LIPASE, AMYLASE in the last 8760 hours. No results for input(s): AMMONIA in the last 8760 hours. CBC:  Recent Labs  02/08/16 04/27/16 05/15/16  WBC 6.3 4.8 5.9  HGB 11.3* 10.4* 10.2*  HCT 35* 33* 33*  PLT 235 197 237   TSH:  Recent Labs  11/30/15  TSH 0.76   A1C: No results found for: HGBA1C Lipid Panel:  Recent Labs  11/30/15  CHOL 144  HDL 31*  LDLCALC 94  TRIG 96     Assessment/Plan   1. Protein-calorie malnutrition, unspecified severity (HCC) -Stable. Receiving nutrition supplements. Weight stable at 163lb today, last two readings= 161lb, 162 lb  2. Deafness, bilateral -Stable. Able to use writing board supplied by speech therapy for communication, although remains limited.   3. Dementia without behavioral disturbance, unspecified dementia type -Stable, on Namzaric. No acute changes to cognitive or functional status  4. Osteoarthritis of knee, unspecified laterality, unspecified osteoarthritis type -Stable. Continues to have discomfort.  -Currently using Aspercreme application, tylenol, and oxycodone as needed for pain. Continue with physical therapy and movement maintenance as tolerated to prevent further compensation.  -Will continue to monitor, sooner if needed  5. Insomnia-  -Will add melatonin to medication regimen as needed for c/o insomina -Will have staff monitor for changes in behavior or oversedation -Will follow next month for improvements, or  sooner if needed   Aqua Denslow K. Biagio Borgubanks, AGNP  [facility] [phone](Monday-Friday 8 am - 5 pm) [phone] (after hours)

## 2016-09-22 LAB — CBC AND DIFFERENTIAL
HEMATOCRIT: 37 % — AB (ref 41–53)
HEMOGLOBIN: 11.2 g/dL — AB (ref 13.5–17.5)
Platelets: 175 10*3/uL (ref 150–399)
WBC: 5.4 10*3/mL

## 2016-10-17 ENCOUNTER — Non-Acute Institutional Stay (SKILLED_NURSING_FACILITY): Payer: Medicare Other | Admitting: Nurse Practitioner

## 2016-10-17 ENCOUNTER — Encounter: Payer: Self-pay | Admitting: Nurse Practitioner

## 2016-10-17 DIAGNOSIS — D649 Anemia, unspecified: Secondary | ICD-10-CM

## 2016-10-17 DIAGNOSIS — H9193 Unspecified hearing loss, bilateral: Secondary | ICD-10-CM

## 2016-10-17 DIAGNOSIS — E46 Unspecified protein-calorie malnutrition: Secondary | ICD-10-CM | POA: Diagnosis not present

## 2016-10-17 DIAGNOSIS — M1710 Unilateral primary osteoarthritis, unspecified knee: Secondary | ICD-10-CM

## 2016-10-17 DIAGNOSIS — M179 Osteoarthritis of knee, unspecified: Secondary | ICD-10-CM

## 2016-10-17 DIAGNOSIS — G4700 Insomnia, unspecified: Secondary | ICD-10-CM

## 2016-10-17 NOTE — Progress Notes (Signed)
Patient ID: Elijah Ramos, male   DOB: 03-Jun-1926, 83 y.o.   MRN: [id number]    Nursing Home Location:  [facility] Room: 129 A  Place of Service: SNF (31)  PCP: Marga Melnick, MD  Allergies  Allergen Reactions  . Aleve [Naproxen Sodium]   . Beta Adrenergic Blockers   . Penicillins   . Sulfa Antibiotics     Chief Complaint  Patient presents with  . Medical Management of Chronic Issues    Resident is being seen for a routine visit.     HPI:  Patient is a 83 y.o. male seen today at [facility] for routine follow up on chronic conditions. Pt with a PMH of CKD, deafness, dementia, dysphagia, CVA, hx of DVT. ROS and HPI limited due to [facility] and dementia. We were able to communicate with bedside writing board and staff RN input. Pt noted to be doing well over the last month. Ecchymosis to left eye from 09/20/16 has cleared. No signs of redness, however continues to have clear, watery drainage. Pt has c/o mild right knee pain. He reports decreased appetite and normal bowel and bladder function. He continues to admit to trouble sleeping. No other acute changes or complaints. Nursing without further concerns.   Review of Systems:  Review of Systems  Unable to perform ROS Pt is severely HOH unable to perform complete ROS with writing board   Past Medical History:  Diagnosis Date  . Anemia   . Bilateral chronic knee pain   . CHF (congestive heart failure) (HCC)   . CKD (chronic kidney disease)   . Deafness, bilateral    reads lips  . Dementia without behavioral disturbance   . DVT (deep venous thrombosis) (HCC)   . Dysphagia   . H/O acute respiratory failure   . H/O: alcohol abuse   . Hyperkalemia   . Hypertension   . Late effects of CVA (cerebrovascular accident)   . Personal history of DVT (deep vein thrombosis)   . PNA (pneumonia)    RLL  . Protein calorie malnutrition (HCC)   . Stroke [facility])    History reviewed. No pertinent surgical history. Social  History:   has an unknown smoking status. He has never used smokeless tobacco. He reports that he does not drink alcohol or use drugs.  History reviewed. No pertinent family history.  Medications: Patient's Medications  New Prescriptions   No medications on file  Previous Medications   ACETAMINOPHEN (TYLENOL) 325 MG TABLET    Take 650 mg by mouth every 12 (twelve) hours. scheduled   ATORVASTATIN (LIPITOR) 10 MG TABLET    Take 5 mg by mouth daily. For CHF   BISACODYL (DULCOLAX) 10 MG SUPPOSITORY    Place 10 mg rectally as needed for moderate constipation. 1 dose every 24h prn   CETIRIZINE (ZYRTEC ALLERGY) 10 MG TABLET    Take 10 mg by mouth at bedtime. For allergies   DOCUSATE SODIUM (COLACE) 100 MG CAPSULE    Take 100 mg by mouth daily. For constipation   FERROUS SULFATE 325 (65 FE) MG TABLET    Take 325 mg by mouth 2 (two) times daily with a meal.   FLUTICASONE (FLONASE) 50 MCG/ACT NASAL SPRAY    Place 2 sprays into both nostrils daily.    FOLIC ACID (FOLVITE) 1 MG TABLET    Take 1 mg by mouth daily.   MAGNESIUM HYDROXIDE (MILK OF MAGNESIA PO)    If no BM in 3 days, give  30 cc of milk of magnesium by mouth x 1 dose in 24 hour as needed   MELATONIN 3 MG TABS    Take by mouth at bedtime as needed.   MEMANTINE HCL-DONEPEZIL HCL (NAMZARIC) 14-10 MG CP24    Take 1 tablet by mouth daily.   MULTIPLE MINERALS-VITAMINS PO    1 tablet by mouth for wound healing   NUTRITIONAL SUPPLEMENTS (ENSURE CLEAR) LIQD    Give 200 ml by mouth twice daily ( JUICE)   OXYCODONE (OXY-IR) 5 MG CAPSULE    Take 1 capsule (5 mg total) by mouth every 8 (eight) hours as needed. For moderate to severe pain   POLYETHYLENE GLYCOL (MIRALAX / GLYCOLAX) PACKET    Take 17 g by mouth daily. For bowel health   PROPYLENE GLYCOL (SYSTANE BALANCE OP)    Place 1 drop into both eyes 2 (two) times daily.   SODIUM BICARBONATE 650 MG TABLET    Take 650 mg by mouth 2 (two) times daily. For chronic kidney disease   THIAMINE 100 MG TABLET     Take 100 mg by mouth daily. For supplement   TROLAMINE SALICYLATE (ASPERCREME) 10 % CREAM    Apply 1 application topically as needed for muscle pain (apply to both knees 3 times daily as needed).  Modified Medications   No medications on file  Discontinued Medications   No medications on file     Physical Exam: Vitals:   10/17/16 1025  BP: 132/74  Pulse: 68  Resp: 20  Temp: 98.1 F (36.7 C)  SpO2: 98%  Weight: 164 lb (74.4 kg)  Height:  (1.753 m)    Physical Exam  Constitutional: He appears well-developed and well-nourished. No distress.  HENT:  Head: Normocephalic and atraumatic.  HOH  Eyes: Pupils are equal, round, and reactive to light. Right eye exhibits discharge. Left eye exhibits discharge.  Clear, watery discharge. Chronic  Neck: Normal range of motion. Neck supple. No JVD present.  Cardiovascular: Normal rate, regular rhythm and normal heart sounds.   Pulses:      Radial pulses are 2+ on the right side, and 2+ on the left side.       Dorsalis pedis pulses are 1+ on the right side, and 1+ on the left side.  Pulmonary/Chest: Effort normal and breath sounds normal. No respiratory distress. He has no wheezes.  Abdominal: Soft. Bowel sounds are normal. He exhibits no distension. There is no tenderness. There is no guarding.  Musculoskeletal: He exhibits no edema or tenderness.       Right knee: He exhibits decreased range of motion. He exhibits no swelling, no effusion, no ecchymosis, no erythema and no bony tenderness. No tenderness found. No medial joint line and no lateral joint line tenderness noted.       Left knee: He exhibits decreased range of motion. He exhibits no swelling, no effusion, no ecchymosis, no erythema and no bony tenderness. No tenderness found. No medial joint line and no lateral joint line tenderness noted.  Neurological: He is alert. He displays no atrophy. He exhibits normal muscle tone.  Alert to person, and time. Disoriented to place.     Skin: Skin is warm and dry. He is not diaphoretic.  Psychiatric: He has a normal mood and affect.    Labs reviewed: Basic Metabolic Panel:  Recent Labs  96/04/54 03/13/16 05/15/16  NA 140 141 141  K 4.8 5.4* 5.5*  BUN 37* 51* 45*  CREATININE 2.2* 2.7* 2.6*  Liver Function Tests:  Recent Labs  11/30/15 03/13/16  AST 10* 12*  ALT 6* 8*  ALKPHOS 72 67   No results for input(s): LIPASE, AMYLASE in the last 8760 hours. No results for input(s): AMMONIA in the last 8760 hours. CBC:  Recent Labs  04/27/16 05/15/16 09/22/16  WBC 4.8 5.9 5.4  HGB 10.4* 10.2* 11.2*  HCT 33* 33* 37*  PLT 197 237 175   TSH:  Recent Labs  11/30/15  TSH 0.76   A1C: No results found for: HGBA1C Lipid Panel:  Recent Labs  11/30/15  CHOL 144  HDL 31*  LDLCALC 94  TRIG 96     Assessment/Plan 1. Deafness, bilateral -Stable. Able to use writing board supplied by speech therapy for communication, although remains limited.   2. Osteoarthritis of knee, unspecified laterality, unspecified osteoarthritis type -Stable. Continues to have discomfort.  No point tenderness, redness, or swelling on assessment. 4+ bilateral quadriceps strength, mild ROM limitation due to immobility, however, no facial grimacing with passive movement.  -Currently using Aspercreme application as needed, tylenol, and oxycodone as needed for pain. Continue with physical therapy and movement maintenance as tolerated to prevent further compensation.  -Unsure of how often he is asking for cream application to knee. He states that this does help. Will schedule BID. -Will continue to monitor, sooner if needed  3. Protein-calorie malnutrition, unspecified severity (HCC) -Stable. Receiving nutrition supplements. Weight stable at 164lb today, last two readings= 163lb, 163 lb  4. Insomnia, unspecified type -Melatonin added 09/14/16 to medication regimen as needed for c/o insomnia.  -At this point, he is unable to get restful  sleep. Will continue for now. Unable to get a clear understanding of needs due to communication barrier.  -Will follow for further changes   5. Anemia - hem positive stools however family did not want further work up . hgb has improved on recent labs.   6 Allergic conjunctivitis  pataday 0.2 % 1 drop daily into both eyes x 1 week   Lorik Guo K. Biagio Borg  [facility] [phone] 8 am - 5 pm) [phone] (after hours)

## 2016-11-30 ENCOUNTER — Non-Acute Institutional Stay (SKILLED_NURSING_FACILITY): Payer: Medicare Other | Admitting: Nurse Practitioner

## 2016-11-30 ENCOUNTER — Encounter: Payer: Self-pay | Admitting: Nurse Practitioner

## 2016-11-30 DIAGNOSIS — M1710 Unilateral primary osteoarthritis, unspecified knee: Secondary | ICD-10-CM | POA: Diagnosis not present

## 2016-11-30 DIAGNOSIS — E46 Unspecified protein-calorie malnutrition: Secondary | ICD-10-CM | POA: Diagnosis not present

## 2016-11-30 DIAGNOSIS — N183 Chronic kidney disease, stage 3 unspecified: Secondary | ICD-10-CM

## 2016-11-30 DIAGNOSIS — D649 Anemia, unspecified: Secondary | ICD-10-CM

## 2016-11-30 DIAGNOSIS — E782 Mixed hyperlipidemia: Secondary | ICD-10-CM | POA: Diagnosis not present

## 2016-11-30 DIAGNOSIS — M179 Osteoarthritis of knee, unspecified: Secondary | ICD-10-CM

## 2016-11-30 DIAGNOSIS — F0390 Unspecified dementia without behavioral disturbance: Secondary | ICD-10-CM

## 2016-11-30 NOTE — Progress Notes (Signed)
Patient ID: Elijah Ramos, male   DOB: May 02, 1926, 83 y.o.   MRN: [id number]    Nursing Home Location:  [facility] Room: 129 A  Place of Service: SNF (31)  PCP: Pecola Lawless, MD  Allergies  Allergen Reactions  . Aleve [Naproxen Sodium]   . Beta Adrenergic Blockers   . Penicillins   . Sulfa Antibiotics     Chief Complaint  Patient presents with  . Medical Management of Chronic Issues    Resident is being seen for a routine visit.     HPI:  Patient is a 83 y.o. male seen today at [facility] for routine follow up on chronic conditions. Pt with a PMH of CKD, deafness, dementia, dysphagia, CVA, hx of DVT. ROS and HPI limited due to [facility] and dementia. We were able to communicate with bedside writing board and staff RN input. Pt reports ongoing pain to knees. Did not request PRN therefore it was stopped. Tylenol is scheduled every 12 hours.  No changes in bowel or bladder. conts to have dark stools but no GI work up per family. Nursing without any acute issues at this time.   Review of Systems:  Review of Systems  Unable to perform ROS Pt is severely HOH unable to perform complete ROS with writing board   Past Medical History:  Diagnosis Date  . Anemia   . Bilateral chronic knee pain   . CHF (congestive heart failure) (HCC)   . CKD (chronic kidney disease)   . Deafness, bilateral    reads lips  . Dementia without behavioral disturbance   . DVT (deep venous thrombosis) (HCC)   . Dysphagia   . H/O acute respiratory failure   . H/O: alcohol abuse   . Hyperkalemia   . Hypertension   . Late effects of CVA (cerebrovascular accident)   . Personal history of DVT (deep vein thrombosis)   . PNA (pneumonia)    RLL  . Protein calorie malnutrition (HCC)   . Stroke [facility])    History reviewed. No pertinent surgical history. Social History:   has an unknown smoking status. He has never used smokeless tobacco. He reports that he does not drink alcohol or  use drugs.  History reviewed. No pertinent family history.  Medications: Patient's Medications  New Prescriptions   No medications on file  Previous Medications   ACETAMINOPHEN (TYLENOL) 325 MG TABLET    Take 650 mg by mouth every 12 (twelve) hours. scheduled   ATORVASTATIN (LIPITOR) 10 MG TABLET    Take 5 mg by mouth daily. For CHF   BISACODYL (DULCOLAX) 10 MG SUPPOSITORY    Place 10 mg rectally as needed for moderate constipation. 1 dose every 24h prn   CETIRIZINE (ZYRTEC ALLERGY) 10 MG TABLET    Take 10 mg by mouth at bedtime. For allergies   DOCUSATE SODIUM (COLACE) 100 MG CAPSULE    Take 100 mg by mouth daily. For constipation   FERROUS SULFATE 325 (65 FE) MG TABLET    Take 325 mg by mouth 2 (two) times daily with a meal.   FLUTICASONE (FLONASE) 50 MCG/ACT NASAL SPRAY    Place 2 sprays into both nostrils daily.    FOLIC ACID (FOLVITE) 1 MG TABLET    Take 1 mg by mouth daily.   MAGNESIUM HYDROXIDE (MILK OF MAGNESIA PO)    If no BM in 3 days, give 30 cc of milk of magnesium by mouth x 1 dose in 24 hour as  needed   MELATONIN 3 MG TABS    Take by mouth at bedtime as needed.   MEMANTINE HCL-DONEPEZIL HCL (NAMZARIC) 14-10 MG CP24    Take 1 tablet by mouth daily.   MULTIPLE MINERALS-VITAMINS PO    1 tablet by mouth for wound healing   POLYETHYLENE GLYCOL (MIRALAX / GLYCOLAX) PACKET    Take 17 g by mouth daily. For bowel health   PROPYLENE GLYCOL (SYSTANE BALANCE OP)    Place 1 drop into both eyes 2 (two) times daily.   SODIUM BICARBONATE 650 MG TABLET    Take 650 mg by mouth 2 (two) times daily. For chronic kidney disease   THIAMINE 100 MG TABLET    Take 100 mg by mouth daily. For supplement   TROLAMINE SALICYLATE (ASPERCREME) 10 % CREAM    Apply 1 application topically as needed for muscle pain (apply to both knees 3 times daily as needed).  Modified Medications   No medications on file  Discontinued Medications   NUTRITIONAL SUPPLEMENTS (ENSURE CLEAR) LIQD    Give 200 ml by mouth twice  daily ( JUICE)   OXYCODONE (OXY-IR) 5 MG CAPSULE    Take 1 capsule (5 mg total) by mouth every 8 (eight) hours as needed. For moderate to severe pain     Physical Exam: Vitals:   11/30/16 1131  BP: 125/83  Pulse: 63  Resp: 19  Temp: 97.7 F (36.5 C)  SpO2: 95%  Weight: 161 lb 3.2 oz (73.1 kg)  Height: 5\' 9"  (1.753 m)    Physical Exam  Constitutional: He appears well-developed and well-nourished. No distress.  HENT:  Head: Normocephalic and atraumatic.  HOH  Eyes: Pupils are equal, round, and reactive to light.  Neck: Normal range of motion. Neck supple. No JVD present.  Cardiovascular: Normal rate, regular rhythm and normal heart sounds.   Pulmonary/Chest: Effort normal and breath sounds normal. No respiratory distress. He has no wheezes.  Abdominal: Soft. Bowel sounds are normal. He exhibits no distension. There is no tenderness. There is no guarding.  Musculoskeletal: He exhibits no edema or tenderness.       Right knee: He exhibits decreased range of motion. He exhibits no swelling, no effusion, no ecchymosis, no erythema and no bony tenderness. No tenderness found. No medial joint line and no lateral joint line tenderness noted.       Left knee: He exhibits decreased range of motion. He exhibits no swelling, no effusion, no ecchymosis, no erythema and no bony tenderness. No tenderness found. No medial joint line and no lateral joint line tenderness noted.  Neurological: He is alert. He displays no atrophy. He exhibits normal muscle tone.  Alert to person, and time. Disoriented to place.   Skin: Skin is warm and dry. He is not diaphoretic.  Psychiatric: He has a normal mood and affect.    Labs reviewed: Basic Metabolic Panel:  Recent Labs  16/10/96 05/15/16  NA 141 141  K 5.4* 5.5*  BUN 51* 45*  CREATININE 2.7* 2.6*   Liver Function Tests:  Recent Labs  03/13/16  AST 12*  ALT 8*  ALKPHOS 67   No results for input(s): LIPASE, AMYLASE in the last 8760  hours. No results for input(s): AMMONIA in the last 8760 hours. CBC:  Recent Labs  04/27/16 05/15/16 09/22/16  WBC 4.8 5.9 5.4  HGB 10.4* 10.2* 11.2*  HCT 33* 33* 37*  PLT 197 237 175   TSH: No results for input(s): TSH in the last  8760 hours. A1C: No results found for: HGBA1C Lipid Panel: No results for input(s): CHOL, HDL, LDLCALC, TRIG, CHOLHDL, LDLDIRECT in the last 8760 hours.   Assessment/Plan 1. Chronic anemia No Gi workup per family. Hgb stable at this time. conts on iron supplement.   2. Osteoarthritis of knee, unspecified laterality, unspecified osteoarthritis type Ongoing pain to knees, will scheduled tylenol every 8 hours. To use aspercream PRN  3. Hyperlipidemia conts on lipitor, will need follow up lipids at next visit.   4. CKD (chronic kidney disease) stage 3, GFR 30-59 ml/min conts on sodium bicarb, will follow up lab.   5. Protein-calorie malnutrition, unspecified severity (HCC) Weight remains stable. Will follow up CMP.   6. Dementia without behavioral disturbance, unspecified dementia type Stable, conts on namzaric; Without acute decline in cognitive  or functional status.  Janene HarveyJessica K. Biagio Borgubanks, AGNP  [facility] [phone](Monday-Friday 8 am - 5 pm) [phone] (after hours)

## 2016-12-04 LAB — HEPATIC FUNCTION PANEL
ALT: 10 U/L (ref 10–40)
AST: 15 U/L (ref 14–40)
Alkaline Phosphatase: 81 U/L (ref 25–125)

## 2016-12-04 LAB — BASIC METABOLIC PANEL
BUN: 41 mg/dL — AB (ref 4–21)
CREATININE: 2.6 mg/dL — AB (ref 0.6–1.3)
Glucose: 86 mg/dL
Potassium: 5.5 mmol/L — AB (ref 3.4–5.3)
Sodium: 143 mmol/L (ref 137–147)

## 2016-12-28 ENCOUNTER — Non-Acute Institutional Stay (SKILLED_NURSING_FACILITY): Payer: Medicare Other | Admitting: Adult Health

## 2016-12-28 ENCOUNTER — Encounter: Payer: Self-pay | Admitting: Adult Health

## 2016-12-28 DIAGNOSIS — N183 Chronic kidney disease, stage 3 unspecified: Secondary | ICD-10-CM

## 2016-12-28 DIAGNOSIS — K5901 Slow transit constipation: Secondary | ICD-10-CM

## 2016-12-28 DIAGNOSIS — E782 Mixed hyperlipidemia: Secondary | ICD-10-CM

## 2016-12-28 DIAGNOSIS — J309 Allergic rhinitis, unspecified: Secondary | ICD-10-CM | POA: Diagnosis not present

## 2016-12-28 DIAGNOSIS — G4700 Insomnia, unspecified: Secondary | ICD-10-CM

## 2016-12-28 DIAGNOSIS — H9193 Unspecified hearing loss, bilateral: Secondary | ICD-10-CM

## 2016-12-28 DIAGNOSIS — H04123 Dry eye syndrome of bilateral lacrimal glands: Secondary | ICD-10-CM

## 2016-12-28 NOTE — Progress Notes (Signed)
DATE:  12/28/2016   MRN:  [id number]  BIRTHDAY: [birth year]  Facility:  Nursing Home Location:  [facility] Room Number: 129-A  LEVEL OF CARE:  SNF [phone])  Contact Information    Name Relation Home Work Lathrop Son [phone]  [phone]   Zhane, Bluitt   [phone]   Flint Melter  [phone]        Code Status History    Date Active Date Inactive Code Status Order ID Comments User Context   10/26/2013  6:44 PM 11/16/2013  5:26 PM DNR [id number]  Rica Mast Inpatient       Chief Complaint  Patient presents with  . Medical Management of Chronic Issues    Routine    HISTORY OF PRESENT ILLNESS:  This is an 62-YO male who is seen for a routine visit.  He is a long-term care resident of [facility] and [address]. He has PMH of deafness, dementia, dysphagia, CVA and history of DVT. He was seen in his room today. He was hard of hearing and communicated to me that it is better to use his communication board. He did not verbalize any concerns.    PAST MEDICAL HISTORY:  Past Medical History:  Diagnosis Date  . Anemia   . Bilateral chronic knee pain   . CHF (congestive heart failure) (HCC)   . CKD (chronic kidney disease)   . Deafness, bilateral    reads lips  . Dementia without behavioral disturbance   . DVT (deep venous thrombosis) (HCC)   . Dysphagia   . H/O acute respiratory failure   . H/O: alcohol abuse   . Hyperkalemia   . Hypertension   . Late effects of CVA (cerebrovascular accident)   . Personal history of DVT (deep vein thrombosis)   . PNA (pneumonia)    RLL  . Protein calorie malnutrition (HCC)   . Stroke [facility])      CURRENT MEDICATIONS: Reviewed  Patient's Medications  New Prescriptions   No medications on file  Previous Medications   ACETAMINOPHEN (TYLENOL) 500 MG TABLET    Take 1,000 mg by mouth every 8 (eight) hours. Scheduled   ATORVASTATIN (LIPITOR) 10 MG TABLET    Take 5 mg by  mouth daily. Take 1/2 tablet to = 5 mg for CHF   BISACODYL (DULCOLAX) 10 MG SUPPOSITORY    Place 10 mg rectally as needed for moderate constipation. 1 dose every 24h prn   CETIRIZINE (ZYRTEC ALLERGY) 10 MG TABLET    Take 10 mg by mouth at bedtime. For allergies   DOCUSATE SODIUM (COLACE) 100 MG CAPSULE    Take 100 mg by mouth daily. For constipation   FERROUS SULFATE 325 (65 FE) MG TABLET    Take 325 mg by mouth 2 (two) times daily with a meal.   FLUTICASONE (FLONASE) 50 MCG/ACT NASAL SPRAY    Place 2 sprays into both nostrils daily.    FOLIC ACID (FOLVITE) 1 MG TABLET    Take 1 mg by mouth daily.   MAGNESIUM HYDROXIDE (MILK OF MAGNESIA PO)    If no BM in 3 days, give 30 cc of milk of magnesium by mouth x 1 dose in 24 hour as needed   MELATONIN 3 MG TABS    Take 3 mg by mouth at bedtime as needed.    MEMANTINE HCL-DONEPEZIL HCL (NAMZARIC) 14-10 MG CP24    Take 1 tablet by mouth daily.   MULTIPLE MINERALS-VITAMINS PO  1 tablet by mouth for wound healing   POLYETHYLENE GLYCOL (MIRALAX / GLYCOLAX) PACKET    Take 17 g by mouth daily. For bowel health   PROPYLENE GLYCOL (SYSTANE BALANCE OP)    Place 1 drop into both eyes 2 (two) times daily.   SODIUM BICARBONATE 650 MG TABLET    Take 650 mg by mouth 2 (two) times daily. For chronic kidney disease   THIAMINE 100 MG TABLET    Take 100 mg by mouth daily. For supplement   TROLAMINE SALICYLATE (ASPERCREME) 10 % CREAM    Apply 1 application topically 2 (two) times daily as needed for muscle pain.   Modified Medications   No medications on file  Discontinued Medications   ACETAMINOPHEN (TYLENOL) 325 MG TABLET    Take 650 mg by mouth every 12 (twelve) hours. scheduled     Allergies  Allergen Reactions  . Aleve [Naproxen Sodium]   . Beta Adrenergic Blockers   . Penicillins   . Sulfa Antibiotics      REVIEW OF SYSTEMS:  GENERAL: no change in appetite, no fatigue, no weight changes, no fever, chills or weakness EYES: Denies change in vision, dry  eyes, eye pain, itching or discharge EARS: Denies ringing in ears, or earache NOSE: Denies nasal congestion or epistaxis MOUTH and THROAT: Denies oral discomfort, gingival pain or bleeding, pain from teeth or hoarseness   RESPIRATORY: no cough, SOB, DOE, wheezing, hemoptysis CARDIAC: no chest pain, edema or palpitations GI: no abdominal pain, diarrhea, constipation, heart burn, nausea or vomiting GU: Denies dysuria, frequency, hematuria, incontinence, or discharge PSYCHIATRIC: Denies feeling of depression or anxiety. No report of hallucinations, insomnia, paranoia, or agitation   PHYSICAL EXAMINATION  GENERAL APPEARANCE: Well nourished. In no acute distress. Normal body habitus SKIN:  Skin is warm and dry.  HEAD: Normal in size and contour. No evidence of trauma EYES: Lids open and close normally. No blepharitis, entropion or ectropion. PERRL. Conjunctivae are clear and sclerae are white. Lenses are without opacity EARS: Pinnae are normal. Hard of hearing MOUTH and THROAT: Lips are without lesions. Oral mucosa is moist and without lesions. Tongue is normal in shape, size, and color and without lesions NECK: supple, trachea midline, no neck masses, no thyroid tenderness, no thyromegaly LYMPHATICS: no LAN in the neck, no supraclavicular LAN RESPIRATORY: breathing is even & unlabored, BS CTAB CARDIAC: RRR, no murmur,no extra heart sounds, no edema GI: abdomen soft, normal BS, no masses, no tenderness, no hepatomegaly, no splenomegaly EXTREMITIES:  Able to move X 4 extremities, BLE has generalized weakness, uses wheelchair PSYCHIATRIC: Alert and oriented X 3. Affect and behavior are appropriate   LABS/RADIOLOGY: Labs reviewed: Basic Metabolic Panel:  Recent Labs  81/19/14 05/15/16 12/04/16  NA 141 141 143  K 5.4* 5.5* 5.5*  BUN 51* 45* 41*  CREATININE 2.7* 2.6* 2.6*   Liver Function Tests:  Recent Labs  03/13/16 12/04/16  AST 12* 15  ALT 8* 10  ALKPHOS 67 81    CBC:  Recent Labs  04/27/16 05/15/16 09/22/16  WBC 4.8 5.9 5.4  HGB 10.4* 10.2* 11.2*  HCT 33* 33* 37*  PLT 197 237 175    ASSESSMENT/PLAN:  1. CKD (chronic kidney disease) stage 3, GFR 30-59 ml/min - Continue sodium bicarbonate 650 mg 1 tab by mouth twice a day Lab Results  Component Value Date   CREATININE 2.6 (A) 12/04/2016    2. Insomnia, unspecified type - continue melatonin 3 mg 1 tab by mouth daily at bedtime  when necessary   3. Allergic rhinitis, unspecified seasonality, unspecified trigger - continue Protopic Propionate 50 g Spray Instill 2 Sprays in Each Nostril Daily and cetirizine 10 mg 1 tab daily   4. Mixed hyperlipidemia - continue atorvastatin 10 mg 1/2 tab 5 mg by mouth daily= 5 mg by mouth daily at bedtime  Lab Results  Component Value Date   CHOL 144 11/30/2015   HDL 31 (A) 11/30/2015   LDLCALC 94 11/30/2015   TRIG 96 11/30/2015    5. Dry eyes - Continue Systane 0.6% 1 drop on each eye twice a day   6. Slow transit constipation - Continue MiraLAX 17 g by mouth daily and docusate 100 mg 1 capsule by mouth daily  7. Bilateral deafness - continue use of communication board, face patient when communicating     Goals of care:  Long-term care   Riddhi Grether C. Medina-Vargas - NP    [facility] [phone]

## 2017-01-22 ENCOUNTER — Non-Acute Institutional Stay (SKILLED_NURSING_FACILITY): Payer: Medicare Other | Admitting: Adult Health

## 2017-01-22 ENCOUNTER — Encounter: Payer: Self-pay | Admitting: Adult Health

## 2017-01-22 DIAGNOSIS — N184 Chronic kidney disease, stage 4 (severe): Secondary | ICD-10-CM | POA: Diagnosis not present

## 2017-01-22 DIAGNOSIS — F0390 Unspecified dementia without behavioral disturbance: Secondary | ICD-10-CM

## 2017-01-22 DIAGNOSIS — J309 Allergic rhinitis, unspecified: Secondary | ICD-10-CM | POA: Diagnosis not present

## 2017-01-22 DIAGNOSIS — E875 Hyperkalemia: Secondary | ICD-10-CM

## 2017-01-22 DIAGNOSIS — K5901 Slow transit constipation: Secondary | ICD-10-CM

## 2017-01-22 DIAGNOSIS — G4700 Insomnia, unspecified: Secondary | ICD-10-CM

## 2017-01-22 DIAGNOSIS — D638 Anemia in other chronic diseases classified elsewhere: Secondary | ICD-10-CM | POA: Diagnosis not present

## 2017-01-22 DIAGNOSIS — E782 Mixed hyperlipidemia: Secondary | ICD-10-CM | POA: Diagnosis not present

## 2017-01-22 NOTE — Progress Notes (Signed)
DATE:  01/22/2017   MRN:  [id number]  BIRTHDAY: 1925-11-28  Facility:  Nursing Home Location:  [facility] Room Number: 129-A  LEVEL OF CARE:  SNF [phone])  Contact Information    Name Relation Home Work Tieton Son [phone]  [phone]   Gregori, Abril   [phone]   Flint Melter  [phone]        Code Status History    Date Active Date Inactive Code Status Order ID Comments User Context   10/26/2013  6:44 PM 11/16/2013  5:26 PM DNR [id number]  Rica Mast Inpatient       Chief Complaint  Patient presents with  . Medical Management of Chronic Issues    Routine visit    HISTORY OF PRESENT ILLNESS:  This is an 47-YO male seen for a routine visit.  He is a long-term care resident of [facility]. He is currently having OT for generalized weakness. He was seen in the room and denies any concerns. He continues to use the [employer] board due to being deaf. He has PMH of deafness, dementia, dysphagia, CVA and history of DVT.    PAST MEDICAL HISTORY:  Past Medical History:  Diagnosis Date  . Anemia   . Bilateral chronic knee pain   . CHF (congestive heart failure) (HCC)   . CKD (chronic kidney disease)   . Deafness, bilateral    reads lips  . Dementia without behavioral disturbance   . DVT (deep venous thrombosis) (HCC)   . Dysphagia   . H/O acute respiratory failure   . H/O: alcohol abuse   . Hyperkalemia   . Hypertension   . Late effects of CVA (cerebrovascular accident)   . Personal history of DVT (deep vein thrombosis)   . PNA (pneumonia)    RLL  . Protein calorie malnutrition (HCC)   . Stroke [facility])      CURRENT MEDICATIONS: Reviewed  Patient's Medications  New Prescriptions   No medications on file  Previous Medications   ACETAMINOPHEN (TYLENOL) 500 MG TABLET    Take 1,000 mg by mouth every 8 (eight) hours. Scheduled   ATORVASTATIN (LIPITOR) 10 MG TABLET     Take 5 mg by mouth daily. Take 1/2 tablet to = 5 mg for CHF   BISACODYL (DULCOLAX) 10 MG SUPPOSITORY    Place 10 mg rectally as needed for moderate constipation. 1 dose every 24h prn   CETIRIZINE (ZYRTEC ALLERGY) 10 MG TABLET    Take 10 mg by mouth at bedtime. For allergies   DOCUSATE SODIUM (COLACE) 100 MG CAPSULE    Take 100 mg by mouth daily. For constipation   FERROUS SULFATE 325 (65 FE) MG TABLET    Take 325 mg by mouth 2 (two) times daily with a meal.   FLUTICASONE (FLONASE) 50 MCG/ACT NASAL SPRAY    Place 2 sprays into both nostrils daily.    FOLIC ACID (FOLVITE) 1 MG TABLET    Take 1 mg by mouth daily.   MAGNESIUM HYDROXIDE (MILK OF MAGNESIA PO)    If no BM in 3 days, give 30 cc of milk of magnesium by mouth x 1 dose in 24 hour as needed   MELATONIN 3 MG TABS    Take 3 mg by mouth at bedtime as needed.    MEMANTINE HCL-DONEPEZIL HCL (NAMZARIC) 14-10 MG CP24    Take 1 tablet by mouth daily.   MULTIPLE MINERALS-VITAMINS PO  1 tablet by mouth for wound healing   POLYETHYLENE GLYCOL (MIRALAX / GLYCOLAX) PACKET    Take 17 g by mouth daily. For bowel health   PROPYLENE GLYCOL (SYSTANE BALANCE OP)    Place 1 drop into both eyes 2 (two) times daily.   SODIUM BICARBONATE 650 MG TABLET    Take 650 mg by mouth 2 (two) times daily. For chronic kidney disease   THIAMINE 100 MG TABLET    Take 100 mg by mouth daily. For supplement  Modified Medications   No medications on file  Discontinued Medications   TROLAMINE SALICYLATE (ASPERCREME) 10 % CREAM    Apply 1 application topically 2 (two) times daily as needed for muscle pain.      Allergies  Allergen Reactions  . Aleve [Naproxen Sodium]   . Beta Adrenergic Blockers   . Penicillins   . Sulfa Antibiotics      REVIEW OF SYSTEMS: Unable to obtain due to being severely hard of hearing and unable to complete ROS on writing board.     PHYSICAL EXAMINATION  GENERAL APPEARANCE: Well nourished. In no acute distress. Normal body habitus SKIN:   Skin is warm and dry.  HEAD: Normal in size and contour. No evidence of trauma EYES: Lids open and close normally. No blepharitis, entropion or ectropion. PERRL. Conjunctivae are clear and sclerae are white. Lenses are without opacity EARS: Pinnae are normal. Deaf MOUTH and THROAT: Lips are without lesions. Oral mucosa is moist and without lesions. Tongue is normal in shape, size, and color and without lesions NECK: supple, trachea midline, no neck masses, no thyroid tenderness, no thyromegaly LYMPHATICS: no LAN in the neck, no supraclavicular LAN RESPIRATORY: breathing is even & unlabored, BS CTAB CARDIAC: RRR, + murmur,no extra heart sounds, no edema GI: abdomen soft, normal BS, no masses, no tenderness, no hepatomegaly, no splenomegaly EXTREMITIES:  Able to move X 4 extremities, bilateral hands with arthritic deformities, R>L PSYCHIATRIC: Alert to self and time, disoriented to place. Affect and behavior are appropriate   LABS/RADIOLOGY: Labs reviewed: Basic Metabolic Panel:  Recent Labs  46/96/2908/29/17 05/15/16 12/04/16  NA 141 141 143  K 5.4* 5.5* 5.5*  BUN 51* 45* 41*  CREATININE 2.7* 2.6* 2.6*   Liver Function Tests:  Recent Labs  03/13/16 12/04/16  AST 12* 15  ALT 8* 10  ALKPHOS 67 81   CBC:  Recent Labs  04/27/16 05/15/16 09/22/16  WBC 4.8 5.9 5.4  HGB 10.4* 10.2* 11.2*  HCT 33* 33* 37*  PLT 197 237 175    ASSESSMENT/PLAN:  1. Hyperkalemia - re-check BMP Lab Results  Component Value Date   K 5.5 (A) 12/04/2016    2. Chronic kidney disease, stage IV (severe) (HCC) - GFR 2.61 ; Continue sodium bicarbonate 650 mg 1 tab by mouth twice a day, will refer to nephrology Lab Results  Component Value Date   CREATININE 2.6 (A) 12/04/2016    3. Anemia of chronic disease -  Discontinue ferrous sulfate, check CBC in 1 month Lab Results  Component Value Date   HGB 11.2 (A) 09/22/2016     4. Slow transit constipation - stable; continue MiraLAX 17 g by mouth daily  and docusate 100 mg 1 capsule by mouth daily   5. Allergic rhinitis, unspecified seasonality, unspecified trigger - stable; continue fluticasone 50 g instill 2 sprays in each nostril daily and cetirizine 10 mg 1 tab by mouth daily at bedtime   6. Insomnia, unspecified type - continue melatonin 3  mg 1 tab by mouth daily at bedtime when necessary   7. Mixed hyperlipidemia - continue atorvastatin 10 mg give 1/2 tab = 5 mg PO Q HS, check lipid panel   8. Dementia without behavioral disturbance, unspecified dementia type - continued Namzaric 14 mg-10 mg PO daily     Goals of care:  Long-term care    Hlee Fringer C. Medina-Vargas - NP    [facility] [phone]

## 2017-01-23 LAB — BASIC METABOLIC PANEL
BUN: 40 — AB (ref 4–21)
Creatinine: 2.4 — AB (ref 0.6–1.3)
Glucose: 97
Potassium: 5.6 — AB (ref 3.4–5.3)
Sodium: 142 (ref 137–147)

## 2017-01-23 LAB — LIPID PANEL
Cholesterol: 142 (ref 0–200)
HDL: 37 (ref 35–70)
LDL CALC: 78
LDL/HDL RATIO: 3.9
TRIGLYCERIDES: 136 (ref 40–160)

## 2017-01-24 ENCOUNTER — Encounter: Payer: Self-pay | Admitting: Adult Health

## 2017-01-24 ENCOUNTER — Non-Acute Institutional Stay (SKILLED_NURSING_FACILITY): Payer: Medicare Other | Admitting: Adult Health

## 2017-01-24 DIAGNOSIS — E875 Hyperkalemia: Secondary | ICD-10-CM | POA: Diagnosis not present

## 2017-01-24 NOTE — Progress Notes (Signed)
DATE:  01/24/2017   MRN:  [id number]  BIRTHDAY: 11/08/1925  Facility:  Nursing Home Location:  [facility] Room Number: 129-A  LEVEL OF CARE:  SNF [phone](31)  Contact Information    Name Relation Home Work [employer]   Klunk,[name] Son [phone]  [phone]   Marybelle KillingsCarter,[name] Other   [phone]   Flint MelterBRUCE,SHARON  [phone]        Code Status History    Date Active Date Inactive Code Status Order ID Comments User Context   10/26/2013  6:44 PM 11/16/2013  5:26 PM DNR [id number]  Rica MastWeber, Cynthia Inpatient       Chief Complaint  Patient presents with  . Acute Visit    Elevated Potassium    HISTORY OF PRESENT ILLNESS:  This is an 83-YO male seen due to K 5.6, elevated. He does not complain of any muscle pain. He does not take any K supplement.      PAST MEDICAL HISTORY:  Past Medical History:  Diagnosis Date  . Anemia   . Bilateral chronic knee pain   . CHF (congestive heart failure) (HCC)   . CKD (chronic kidney disease)   . Deafness, bilateral    reads lips  . Dementia without behavioral disturbance   . DVT (deep venous thrombosis) (HCC)   . Dysphagia   . H/O acute respiratory failure   . H/O: alcohol abuse   . Hyperkalemia   . Hypertension   . Late effects of CVA (cerebrovascular accident)   . Personal history of DVT (deep vein thrombosis)   . PNA (pneumonia)    RLL  . Protein calorie malnutrition (HCC)   . Stroke [facility](HCC)      CURRENT MEDICATIONS: Reviewed  Patient's Medications  New Prescriptions   No medications on file  Previous Medications   ACETAMINOPHEN (TYLENOL) 500 MG TABLET    Take 1,000 mg by mouth every 8 (eight) hours. Scheduled   ATORVASTATIN (LIPITOR) 10 MG TABLET    Take 5 mg by mouth daily. Take 1/2 tablet to = 5 mg for CHF   BISACODYL (DULCOLAX) 10 MG SUPPOSITORY    Place 10 mg rectally as needed for moderate constipation. 1 dose every 24h prn   CETIRIZINE (ZYRTEC ALLERGY) 10 MG TABLET    Take 10 mg by  mouth at bedtime. For allergies   DOCUSATE SODIUM (COLACE) 100 MG CAPSULE    Take 100 mg by mouth daily. For constipation   FLUTICASONE (FLONASE) 50 MCG/ACT NASAL SPRAY    Place 2 sprays into both nostrils daily.    FOLIC ACID (FOLVITE) 1 MG TABLET    Take 1 mg by mouth daily.   MAGNESIUM HYDROXIDE (MILK OF MAGNESIA PO)    If no BM in 3 days, give 30 cc of milk of magnesium by mouth x 1 dose in 24 hour as needed   MELATONIN 3 MG TABS    Take 3 mg by mouth at bedtime as needed.    MEMANTINE HCL-DONEPEZIL HCL (NAMZARIC) 14-10 MG CP24    Take 1 tablet by mouth daily.   MULTIPLE MINERALS-VITAMINS PO    1 tablet by mouth for wound healing   POLYETHYLENE GLYCOL (MIRALAX / GLYCOLAX) PACKET    Take 17 g by mouth daily. For bowel health   PROPYLENE GLYCOL (SYSTANE BALANCE OP)    Place 1 drop into both eyes 2 (two) times daily.   SODIUM BICARBONATE 650 MG TABLET    Take 650 mg by mouth 2 (  two) times daily. For chronic kidney disease   THIAMINE 100 MG TABLET    Take 100 mg by mouth daily. For supplement  Modified Medications   No medications on file  Discontinued Medications   FERROUS SULFATE 325 (65 FE) MG TABLET    Take 325 mg by mouth 2 (two) times daily with a meal.     Allergies  Allergen Reactions  . Aleve [Naproxen Sodium]   . Beta Adrenergic Blockers   . Penicillins   . Sulfa Antibiotics      REVIEW OF SYSTEMS: Unable to obtain due to being severely hard of hearing and unable to complete ROS on writing board.    PHYSICAL EXAMINATION  GENERAL APPEARANCE: Well nourished. In no acute distress. Normal body habitus SKIN:  Skin is warm and dry.  HEAD: Normal in size and contour. No evidence of trauma EYES: Lids open and close normally. No blepharitis, entropion or ectropion. PERRL. Conjunctivae are clear and sclerae are white. Lenses are without opacity EARS: Pinnae are normal. Deaf MOUTH and THROAT: Lips are without lesions. Oral mucosa is moist and without lesions. Tongue is normal in  shape, size, and color and without lesions NECK: supple, trachea midline, no neck masses, no thyroid tenderness, no thyromegaly LYMPHATICS: no LAN in the neck, no supraclavicular LAN RESPIRATORY: breathing is even & unlabored, BS CTAB CARDIAC: RRR, + murmur,no extra heart sounds, no edema GI: abdomen soft, normal BS, no masses, no tenderness, no hepatomegaly, no splenomegaly EXTREMITIES:  Able to move X 4 extremities, bilateral hands with arthritic deformities, R>L PSYCHIATRIC: Alert to self and time, disoriented to place. Affect and behavior are appropriate   LABS/RADIOLOGY: Labs reviewed: Basic Metabolic Panel:  Recent Labs  47/82/95 12/04/16 01/23/17  NA 141 143 142  K 5.5* 5.5* 5.6*  BUN 45* 41* 40*  CREATININE 2.6* 2.6* 2.4*   Liver Function Tests:  Recent Labs  03/13/16 12/04/16  AST 12* 15  ALT 8* 10  ALKPHOS 67 81   CBC:  Recent Labs  04/27/16 05/15/16 09/22/16  WBC 4.8 5.9 5.4  HGB 10.4* 10.2* 11.2*  HCT 33* 33* 37*  PLT 197 237 175    ASSESSMENT/PLAN:  1. Hyperkalemia - give Kayexalate 15gm/60 ml PO X 1; BMP on 01/25/17 Lab Results  Component Value Date   K 5.6 (A) 01/23/2017     Monina C. Medina-Vargas - NP    [employer] [phone]

## 2017-01-30 LAB — BASIC METABOLIC PANEL
BUN: 47 — AB (ref 4–21)
CREATININE: 2.6 — AB (ref 0.6–1.3)
GLUCOSE: 98
Potassium: 5.1 (ref 3.4–5.3)
Sodium: 143 (ref 137–147)

## 2017-02-05 ENCOUNTER — Non-Acute Institutional Stay (SKILLED_NURSING_FACILITY): Payer: Medicare Other

## 2017-02-05 DIAGNOSIS — Z0000 Encounter for general adult medical examination without abnormal findings: Secondary | ICD-10-CM | POA: Diagnosis not present

## 2017-02-05 NOTE — Patient Instructions (Signed)
[name] , Thank you for taking time to come for your Medicare Wellness Visit. I appreciate your ongoing commitment to your health goals. Please review the following plan we discussed and let me know if I can assist you in the future.   Screening recommendations/referrals: Colonoscopy excluded, pt over age 81 Recommended yearly ophthalmology/optometry visit for glaucoma screening and checkup Recommended yearly dental visit for hygiene and checkup  Vaccinations: Influenza vaccine up to date. Due 04/20/17 Pneumococcal vaccine 13 due, ordered Tdap vaccine due, ordered Shingles vaccine not in records  Advanced directives: Need copy for chart  Conditions/risks identified: None  Next appointment: Dr. Alwyn Ramos makes rounds  Preventive Care 65 Years and Older, Male Preventive care refers to lifestyle choices and visits with your health care provider that can promote health and wellness. What does preventive care include?  A yearly physical exam. This is also called an annual well check.  Dental exams once or twice a year.  Routine eye exams. Ask your health care provider how often you should have your eyes checked.  Personal lifestyle choices, including:  Daily care of your teeth and gums.  Regular physical activity.  Eating a healthy diet.  Avoiding tobacco and drug use.  Limiting alcohol use.  Practicing safe sex.  Taking low doses of aspirin every day.  Taking vitamin and mineral supplements as recommended by your health care provider. What happens during an annual well check? The services and screenings done by your health care provider during your annual well check will depend on your age, overall health, lifestyle risk factors, and family history of disease. Counseling  Your health care provider may ask you questions about your:  Alcohol use.  Tobacco use.  Drug use.  Emotional well-being.  Home and relationship well-being.  Sexual activity.  Eating  habits.  History of falls.  Memory and ability to understand (cognition).  Work and work Astronomerenvironment. Screening  You may have the following tests or measurements:  Height, weight, and BMI.  Blood pressure.  Lipid and cholesterol levels. These may be checked every 5 years, or more frequently if you are over 81 years old.  Skin check.  Lung cancer screening. You may have this screening every year starting at age 81 if you have a 30-pack-year history of smoking and currently smoke or have quit within the past 15 years.  Fecal occult blood test (FOBT) of the stool. You may have this test every year starting at age 81.  Flexible sigmoidoscopy or colonoscopy. You may have a sigmoidoscopy every 5 years or a colonoscopy every 10 years starting at age 81.  Prostate cancer screening. Recommendations will vary depending on your family history and other risks.  Hepatitis C blood test.  Hepatitis B blood test.  Sexually transmitted disease (STD) testing.  Diabetes screening. This is done by checking your blood sugar (glucose) after you have not eaten for a while (fasting). You may have this done every 1-3 years.  Abdominal aortic aneurysm (AAA) screening. You may need this if you are a current or former smoker.  Osteoporosis. You may be screened starting at age 81 if you are at high risk. Talk with your health care provider about your test results, treatment options, and if necessary, the need for more tests. Vaccines  Your health care provider may recommend certain vaccines, such as:  Influenza vaccine. This is recommended every year.  Tetanus, diphtheria, and acellular pertussis (Tdap, Td) vaccine. You may need a Td booster every 10 years.  Zoster vaccine. You may need this after age 61.  Pneumococcal 13-valent conjugate (PCV13) vaccine. One dose is recommended after age 41.  Pneumococcal polysaccharide (PPSV23) vaccine. One dose is recommended after age 64. Talk to your health  care provider about which screenings and vaccines you need and how often you need them. This information is not intended to replace advice given to you by your health care provider. Make sure you discuss any questions you have with your health care provider. Document Released: 07/29/2015 Document Revised: 03/21/2016 Document Reviewed: 05/03/2015 Elsevier Interactive Patient Education  2017 [place] Prevention in the Home Falls can cause injuries. They can happen to people of all ages. There are many things you can do to make your home safe and to help prevent falls. What can I do on the outside of my home?  Regularly fix the edges of walkways and driveways and fix any cracks.  Remove anything that might make you trip as you walk through a door, such as a raised step or threshold.  Trim any bushes or trees on the path to your home.  Use bright outdoor lighting.  Clear any walking paths of anything that might make someone trip, such as rocks or tools.  Regularly check to see if handrails are loose or broken. Make sure that both sides of any steps have handrails.  Any raised decks and porches should have guardrails on the edges.  Have any leaves, snow, or ice cleared regularly.  Use sand or salt on walking paths during winter.  Clean up any spills in your garage right away. This includes oil or grease spills. What can I do in the bathroom?  Use night lights.  Install grab bars by the toilet and in the tub and shower. Do not use towel bars as grab bars.  Use non-skid mats or decals in the tub or shower.  If you need to sit down in the shower, use a plastic, non-slip stool.  Keep the floor dry. Clean up any water that spills on the floor as soon as it happens.  Remove soap buildup in the tub or shower regularly.  Attach bath mats securely with double-sided non-slip rug tape.  Do not have throw rugs and other things on the floor that can make you trip. What can I do  in the bedroom?  Use night lights.  Make sure that you have a light by your bed that is easy to reach.  Do not use any sheets or blankets that are too big for your bed. They should not hang down onto the floor.  Have a firm chair that has side arms. You can use this for support while you get dressed.  Do not have throw rugs and other things on the floor that can make you trip. What can I do in the kitchen?  Clean up any spills right away.  Avoid walking on wet floors.  Keep items that you use a lot in easy-to-reach places.  If you need to reach something above you, use a strong step stool that has a grab bar.  Keep electrical cords out of the way.  Do not use floor polish or wax that makes floors slippery. If you must use wax, use non-skid floor wax.  Do not have throw rugs and other things on the floor that can make you trip. What can I do with my stairs?  Do not leave any items on the stairs.  Make sure that there are  handrails on both sides of the stairs and use them. Fix handrails that are broken or loose. Make sure that handrails are as long as the stairways.  Check any carpeting to make sure that it is firmly attached to the stairs. Fix any carpet that is loose or worn.  Avoid having throw rugs at the top or bottom of the stairs. If you do have throw rugs, attach them to the floor with carpet tape.  Make sure that you have a light switch at the top of the stairs and the bottom of the stairs. If you do not have them, ask someone to add them for you. What else can I do to help prevent falls?  Wear shoes that:  Do not have high heels.  Have rubber bottoms.  Are comfortable and fit you well.  Are closed at the toe. Do not wear sandals.  If you use a stepladder:  Make sure that it is fully opened. Do not climb a closed stepladder.  Make sure that both sides of the stepladder are locked into place.  Ask someone to hold it for you, if possible.  Clearly mark  and make sure that you can see:  Any grab bars or handrails.  First and last steps.  Where the edge of each step is.  Use tools that help you move around (mobility aids) if they are needed. These include:  Canes.  Walkers.  Scooters.  Crutches.  Turn on the lights when you go into a dark area. Replace any light bulbs as soon as they burn out.  Set up your furniture so you have a clear path. Avoid moving your furniture around.  If any of your floors are uneven, fix them.  If there are any pets around you, be aware of where they are.  Review your medicines with your doctor. Some medicines can make you feel dizzy. This can increase your chance of falling. Ask your doctor what other things that you can do to help prevent falls. This information is not intended to replace advice given to you by your health care provider. Make sure you discuss any questions you have with your health care provider. Document Released: 04/28/2009 Document Revised: 12/08/2015 Document Reviewed: 08/06/2014 Elsevier Interactive Patient Education  2017 [employer].

## 2017-02-05 NOTE — Progress Notes (Signed)
Subjective:   Elijah Ramos is a 83 y.o. male who presents for an Initial Medicare Annual Wellness Visit at [facility]; incapacitated patient unable to answer questions appropriately    Objective:    Today's Vitals   02/05/17 1241  BP: 132/66  Pulse: 66  Temp: 97.6 F (36.4 C)  TempSrc: Oral  SpO2: 97%  Weight: 165 lb (74.8 kg)  Height: 5\' 9"  (1.753 m)  PainSc: 5    Body mass index is 24.37 kg/m.  Current Medications (verified) Outpatient Encounter Prescriptions as of 02/05/2017  Medication Sig  . acetaminophen (TYLENOL) 500 MG tablet Take 1,000 mg by mouth every 8 (eight) hours. Scheduled  . atorvastatin (LIPITOR) 10 MG tablet Take 5 mg by mouth daily. Take 1/2 tablet to = 5 mg for CHF  . bisacodyl (DULCOLAX) 10 MG suppository Place 10 mg rectally as needed for moderate constipation. 1 dose every 24h prn  . cetirizine (ZYRTEC ALLERGY) 10 MG tablet Take 10 mg by mouth at bedtime. For allergies  . docusate sodium (COLACE) 100 MG capsule Take 100 mg by mouth daily. For constipation  . fluticasone (FLONASE) 50 MCG/ACT nasal spray Place 2 sprays into both nostrils daily.   . folic acid (FOLVITE) 1 MG tablet Take 1 mg by mouth daily.  . Magnesium Hydroxide (MILK OF MAGNESIA PO) If no BM in 3 days, give 30 cc of milk of magnesium by mouth x 1 dose in 24 hour as needed  . Melatonin 3 MG TABS Take 3 mg by mouth at bedtime as needed.   . Memantine HCl-Donepezil HCl (NAMZARIC) 14-10 MG CP24 Take 1 tablet by mouth daily.  . MULTIPLE MINERALS-VITAMINS PO 1 tablet by mouth for wound healing  . polyethylene glycol (MIRALAX / GLYCOLAX) packet Take 17 g by mouth daily. For bowel health  . Propylene Glycol (SYSTANE BALANCE OP) Place 1 drop into both eyes 2 (two) times daily.  . sodium bicarbonate 650 MG tablet Take 650 mg by mouth 2 (two) times daily. For chronic kidney disease  . thiamine 100 MG tablet Take 100 mg by mouth daily. For supplement   No facility-administered  encounter medications on file as of 02/05/2017.     Allergies (verified) Aleve [naproxen sodium]; Beta adrenergic blockers; Penicillins; and Sulfa antibiotics   History: Past Medical History:  Diagnosis Date  . Anemia   . Bilateral chronic knee pain   . CHF (congestive heart failure) (HCC)   . CKD (chronic kidney disease)   . Deafness, bilateral    reads lips  . Dementia without behavioral disturbance   . DVT (deep venous thrombosis) (HCC)   . Dysphagia   . H/O acute respiratory failure   . H/O: alcohol abuse   . Hyperkalemia   . Hypertension   . Late effects of CVA (cerebrovascular accident)   . Personal history of DVT (deep vein thrombosis)   . PNA (pneumonia)    RLL  . Protein calorie malnutrition (HCC)   . Stroke [facility](HCC)    History reviewed. No pertinent surgical history. History reviewed. No pertinent family history. Social History   Occupational History  . Not on file.   Social History Main Topics  . Smoking status: Unknown If Ever Smoked  . Smokeless tobacco: Never Used  . Alcohol use No     Comment: former abuse  . Drug use: No  . Sexual activity: No   Tobacco Counseling Counseling given: Not Answered   Activities of Daily Living No flowsheet  data found.  Immunizations and Health Maintenance Immunization History  Administered Date(s) Administered  . Influenza-Unspecified 04/27/2015, 04/09/2016  . PPD Test 11/16/2013   There are no preventive care reminders to display for this patient.  Patient Care Team: Elijah Lawless, MD as PCP - General (Internal Medicine) Rehab, [facility]  Indicate any recent Medical Services you may have received from other than [facility] providers in the past year (date may be approximate).    Assessment:   This is a routine wellness examination for Elijah Ramos.   Hearing/Vision screen No exam data present  Dietary issues and exercise activities discussed:    Goals    None      Depression Screen PHQ 2/9 Scores 02/05/2017  PHQ - 2 Score 1    Fall Risk Fall Risk  02/05/2017 08/17/2016 05/18/2016 03/30/2016 03/09/2016  Falls in the past year? No Yes Yes No No  Number falls in past yr: - 1 1 - -  Injury with Fall? - No No - -    Cognitive Function: MMSE - Mini Mental State Exam 02/05/2017  Not completed: Unable to complete        Screening Tests Health Maintenance  Topic Date Due  . TETANUS/TDAP  09/30/2043 (Originally 12/18/1944)  . PNA vac Low Risk Adult (1 of 2 - PCV13) 09/30/2043 (Originally 12/17/1992)  . INFLUENZA VACCINE  02/13/2017        Plan:    I have personally reviewed and addressed the Medicare Annual Wellness questionnaire and have noted the following in the patient's chart:  A. Medical and social history B. Use of alcohol, tobacco or illicit drugs  C. Current medications and supplements D. Functional ability and status E.  Nutritional status F.  Physical activity G. Advance directives H. List of other physicians I.  Hospitalizations, surgeries, and ER visits in previous 12 months J.  Vitals K. Screenings to include hearing, vision, cognitive, depression L. Referrals and appointments - none  In addition, unable to review and discuss with incapacitated patient certain preventive protocols, quality metrics, and best practice recommendations. A written personalized care plan for preventive services as well as general preventive health recommendations were provided to patient.  See attached scanned questionnaire for additional information.   Signed,   Annetta Maw, RN Nurse Health Advisor   Quick Notes   Health Maintenance: PNA 13, TDAP due     Abnormal Screen:PHQ-2:1     Patient Concerns: None     Nurse Concerns: None I have personally reviewed the health advisor's clinical note, was available for consultation, and agree with the assessment and plan as written. Elijah Ramos M.D., FACP, [facility]

## 2017-02-14 ENCOUNTER — Encounter: Payer: Self-pay | Admitting: Adult Health

## 2017-02-14 ENCOUNTER — Non-Acute Institutional Stay (SKILLED_NURSING_FACILITY): Payer: Medicare Other | Admitting: Adult Health

## 2017-02-14 DIAGNOSIS — G4700 Insomnia, unspecified: Secondary | ICD-10-CM

## 2017-02-14 DIAGNOSIS — N184 Chronic kidney disease, stage 4 (severe): Secondary | ICD-10-CM | POA: Diagnosis not present

## 2017-02-14 DIAGNOSIS — J309 Allergic rhinitis, unspecified: Secondary | ICD-10-CM | POA: Diagnosis not present

## 2017-02-14 DIAGNOSIS — F0390 Unspecified dementia without behavioral disturbance: Secondary | ICD-10-CM | POA: Diagnosis not present

## 2017-02-14 DIAGNOSIS — E782 Mixed hyperlipidemia: Secondary | ICD-10-CM

## 2017-02-14 DIAGNOSIS — H04123 Dry eye syndrome of bilateral lacrimal glands: Secondary | ICD-10-CM | POA: Diagnosis not present

## 2017-02-14 NOTE — Progress Notes (Signed)
DATE:  02/14/2017   MRN:  [id number]  BIRTHDAY: 11/06/1925  Facility:  Nursing Home Location:  [facility] Room Number: 129-A  LEVEL OF CARE:  SNF [phone]  Contact Information    Name Relation Home Work [place]   Tench,[name] Son [phone]  [phone]   Marybelle KillingsCarter,[name] Other   [phone]   Flint MelterBRUCE,SHARON  [phone]        Code Status History    Date Active Date Inactive Code Status Order ID Comments User Context   10/26/2013  6:44 PM 11/16/2013  5:26 PM DNR [id number]  Rica MastWeber, Cynthia Inpatient       Chief Complaint  Patient presents with  . Medical Management of Chronic Issues    Routine visit    HISTORY OF PRESENT ILLNESS:  This is an 83-YO male seen for a routine visit.  He is a long-term care [facility].  He has a PMH of deafness, dementia, dysphagia, CVA and history of DVT. He was seen in his room today. He was recently treated for hyperkalemia. Latest K 5.1. He did not verbalize any concerns.    PAST MEDICAL HISTORY:  Past Medical History:  Diagnosis Date  . Anemia   . Bilateral chronic knee pain   . CHF (congestive heart failure) (HCC)   . CKD (chronic kidney disease)   . Deafness, bilateral    reads lips  . Dementia without behavioral disturbance   . DVT (deep venous thrombosis) (HCC)   . Dysphagia   . H/O acute respiratory failure   . H/O: alcohol abuse   . Hyperkalemia   . Hypertension   . Late effects of CVA (cerebrovascular accident)   . Personal history of DVT (deep vein thrombosis)   . PNA (pneumonia)    RLL  . Protein calorie malnutrition (HCC)   . Stroke [facility](HCC)      CURRENT MEDICATIONS: Reviewed  Patient's Medications  New Prescriptions   No medications on file  Previous Medications   ACETAMINOPHEN (TYLENOL) 500 MG TABLET    Take 1,000 mg by mouth every 8 (eight) hours. Scheduled   ATORVASTATIN (LIPITOR) 10 MG TABLET    Take 5 mg by mouth daily. Take 1/2 tablet  to = 5 mg for HLD   BISACODYL (DULCOLAX) 10 MG SUPPOSITORY    Place 10 mg rectally as needed for moderate constipation. 1 dose every 24h prn   CETIRIZINE (ZYRTEC ALLERGY) 10 MG TABLET    Take 10 mg by mouth at bedtime. For allergies   DOCUSATE SODIUM (COLACE) 100 MG CAPSULE    Take 100 mg by mouth daily. For constipation   FLUTICASONE (FLONASE) 50 MCG/ACT NASAL SPRAY    Place 2 sprays into both nostrils daily.    FOLIC ACID (FOLVITE) 1 MG TABLET    Take 1 mg by mouth daily.   MAGNESIUM HYDROXIDE (MILK OF MAGNESIA PO)    If no BM in 3 days, give 30 cc of milk of magnesium by mouth x 1 dose in 24 hour as needed   MELATONIN 3 MG TABS    Take 3 mg by mouth at bedtime as needed.    MEMANTINE HCL-DONEPEZIL HCL (NAMZARIC) 14-10 MG CP24    Take 1 tablet by mouth daily.   MULTIPLE MINERALS-VITAMINS PO    1 tablet by mouth for wound healing   POLYETHYLENE GLYCOL (MIRALAX / GLYCOLAX) PACKET    Take 17 g by mouth daily. For bowel health   PROPYLENE  GLYCOL (SYSTANE BALANCE OP)    Place 1 drop into both eyes 2 (two) times daily.   SODIUM BICARBONATE 650 MG TABLET    Take 650 mg by mouth 2 (two) times daily. For chronic kidney disease   THIAMINE 100 MG TABLET    Take 100 mg by mouth daily. For supplement  Modified Medications   No medications on file  Discontinued Medications   No medications on file     Allergies  Allergen Reactions  . Aleve [Naproxen Sodium]   . Beta Adrenergic Blockers   . Penicillins   . Sulfa Antibiotics      REVIEW OF SYSTEMS:  GENERAL: no change in appetite, no fatigue, no weight changes, no fever, chills or weakness MOUTH and THROAT: Denies oral discomfort, gingival pain or bleeding, pain from teeth or hoarseness   RESPIRATORY: no cough, SOB, DOE, wheezing, hemoptysis CARDIAC: no chest pain, edema or palpitations GI: no abdominal pain, diarrhea, constipation, heart burn, nausea or vomiting GU: Denies dysuria, frequency, hematuria, incontinence, or  discharge PSYCHIATRIC: Denies feeling of depression or anxiety. No report of hallucinations, insomnia, paranoia, or agitation     PHYSICAL EXAMINATION  GENERAL APPEARANCE: Well nourished. In no acute distress. Normal body habitus SKIN:  Skin is warm and dry.  EARS:  Deaf, uses Public affairs consultantcommunication board MOUTH and THROAT: Lips are without lesions. Oral mucosa is moist and without lesions. Tongue is normal in shape, size, and color and without lesions RESPIRATORY: breathing is even & unlabored, BS CTAB CARDIAC: RRR, + murmur,no extra heart sounds, no edema GI: abdomen soft, normal BS, no masses, no tenderness, no hepatomegaly, no splenomegaly EXTREMITIES:  Able to move X 4 extremities PSYCHIATRIC: Alert to self, knows year but disoriented to month and place. He thinks he [place]. Affect and behavior are appropriate   LABS/RADIOLOGY: Labs reviewed: Basic Metabolic Panel:  Recent Labs  21/30/8605/22/18 01/23/17 01/30/17  NA 143 142 143  K 5.5* 5.6* 5.1  BUN 41* 40* 47*  CREATININE 2.6* 2.4* 2.6*   Liver Function Tests:  Recent Labs  03/13/16 12/04/16  AST 12* 15  ALT 8* 10  ALKPHOS 67 81   CBC:  Recent Labs  04/27/16 05/15/16 09/22/16  WBC 4.8 5.9 5.4  HGB 10.4* 10.2* 11.2*  HCT 33* 33* 37*  PLT 197 237 175   Lipid Panel:  Recent Labs  01/23/17  HDL 37    ASSESSMENT/PLAN:  1. Dementia without behavioral disturbance, unspecified dementia type - Stable; continue Namzaric 14-10 mg 1 capsule daily   2. Allergic rhinitis, unspecified seasonality, unspecified trigger - continue cetirizine 10 mg 1 tab daily at bedtime and fluticasone 50 g instill 2 sprays in each nostril daily   3. Mixed hyperlipidemia -  Continue Atorvastatin10 mg give 1/2 tab = 5 mg Q HS Lab Results  Component Value Date   CHOL 142 01/23/2017   HDL 37 01/23/2017   LDLCALC 78 01/23/2017   TRIG 136 01/23/2017     4. Chronic kidney disease, stage IV (severe) (HCC) - continue  sodium bicarbonate  650 mg 1 tab twice a day Lab Results  Component Value Date   CREATININE 2.6 (A) 01/30/2017     5. Insomnia, unspecified type - stable; continue melatonin 3 mg 1 tab daily at bedtime when necessary   6. Dry eyes - no complaints of eye pain; continue Systane 0.6% 1 drop to each eye twice a day      Goals of care:  Long-term care  Monina C. Solon - NP    [employer] [phone]

## 2017-02-22 LAB — CBC AND DIFFERENTIAL
HCT: 31 — AB (ref 41–53)
HEMOGLOBIN: 9.9 — AB (ref 13.5–17.5)
Neutrophils Absolute: 3
Platelets: 175 (ref 150–399)
WBC: 4.8

## 2017-02-25 LAB — CBC AND DIFFERENTIAL
HCT: 29 — AB (ref 41–53)
Hemoglobin: 9.4 — AB (ref 13.5–17.5)
NEUTROS ABS: 2
Platelets: 177 (ref 150–399)
WBC: 3.9

## 2017-02-25 LAB — VITAMIN B12: VITAMIN B 12: 417

## 2017-03-29 ENCOUNTER — Non-Acute Institutional Stay (SKILLED_NURSING_FACILITY): Payer: Medicare Other | Admitting: Adult Health

## 2017-03-29 ENCOUNTER — Encounter: Payer: Self-pay | Admitting: Adult Health

## 2017-03-29 DIAGNOSIS — N184 Chronic kidney disease, stage 4 (severe): Secondary | ICD-10-CM | POA: Diagnosis not present

## 2017-03-29 DIAGNOSIS — G4700 Insomnia, unspecified: Secondary | ICD-10-CM

## 2017-03-29 DIAGNOSIS — J309 Allergic rhinitis, unspecified: Secondary | ICD-10-CM | POA: Diagnosis not present

## 2017-03-29 DIAGNOSIS — F0390 Unspecified dementia without behavioral disturbance: Secondary | ICD-10-CM

## 2017-03-29 DIAGNOSIS — F1011 Alcohol abuse, in remission: Secondary | ICD-10-CM

## 2017-03-29 DIAGNOSIS — Z87898 Personal history of other specified conditions: Secondary | ICD-10-CM

## 2017-03-29 NOTE — Progress Notes (Signed)
DATE:  03/29/2017   MRN:  [id number]  BIRTHDAY: 02/08/1926  Facility:  Nursing Home Location:  [facility] Room Number: 129-A  LEVEL OF CARE:  SNF [phone])  Contact Information    Name Relation Home Work [place] Son [phone]  [phone]   Jenson, Beedle   [place]  [phone]        Code Status History    Date Active Date Inactive Code Status Order ID Comments User Context   10/26/2013  6:44 PM 11/16/2013  5:26 PM DNR [id number]  Ermalinda Memos Inpatient       Chief Complaint  Patient presents with  . Medical Management of Chronic Issues    Routine visit    HISTORY OF PRESENT ILLNESS:  This is an 35-YO male seen for a routine visit.  He is a long-term care resident at [facility].   He has a PMH of deafness, dementia, dysphagia, CVA, and history of DVT. He was seen in his room sleeping. He is deaf on both ears and uses communication board. He has been stable for the past month. Upon record review, he has not seen a nephrologist regarding CKD, stage IV.  Latest creatinine 2.61 and eGFR 20.81  .    PAST MEDICAL HISTORY:  Past Medical History:  Diagnosis Date  . Anemia   . Bilateral chronic knee pain   . CHF (congestive heart failure) ([place])   . CKD (chronic kidney disease)   . Deafness, bilateral    reads lips  . Dementia without behavioral disturbance   . DVT (deep venous thrombosis) ([place])   . Dysphagia   . H/O acute respiratory failure   . H/O: alcohol abuse   . Hyperkalemia   . Hypertension   . Late effects of CVA (cerebrovascular accident)   . Personal history of DVT (deep vein thrombosis)   . PNA (pneumonia)    RLL  . Protein calorie malnutrition ([place])   . Stroke [facility])      CURRENT MEDICATIONS: Reviewed  Patient's Medications  New Prescriptions   No medications on file  Previous Medications   ACETAMINOPHEN (TYLENOL) 500 MG TABLET    Take 1,000 mg  by mouth every 8 (eight) hours. Scheduled   ATORVASTATIN (LIPITOR) 10 MG TABLET    Take 5 mg by mouth daily. Take 1/2 tablet to = 5 mg for HLD   BISACODYL (DULCOLAX) 10 MG SUPPOSITORY    Place 10 mg rectally as needed for moderate constipation. 1 dose every 24h prn   CETIRIZINE (ZYRTEC ALLERGY) 10 MG TABLET    Take 10 mg by mouth at bedtime. For allergies   DOCUSATE SODIUM (COLACE) 100 MG CAPSULE    Take 100 mg by mouth daily. For constipation   FLUTICASONE (FLONASE) 50 MCG/ACT NASAL SPRAY    Place 2 sprays into both nostrils daily.    FOLIC ACID (FOLVITE) 1 MG TABLET    Take 1 mg by mouth daily.   MAGNESIUM HYDROXIDE (MILK OF MAGNESIA PO)    If no BM in 3 days, give 30 cc of milk of magnesium by mouth x 1 dose in 24 hour as needed   MELATONIN 3 MG TABS    Take 3 mg by mouth at bedtime as needed.    MEMANTINE HCL-DONEPEZIL HCL (NAMZARIC) 14-10 MG CP24    Take 1 tablet by mouth daily.   MULTIPLE MINERALS-VITAMINS PO    1 tablet by mouth  for wound healing   POLYETHYLENE GLYCOL (MIRALAX / GLYCOLAX) PACKET    Take 17 g by mouth daily. For bowel health   PROPYLENE GLYCOL (SYSTANE BALANCE OP)    Place 1 drop into both eyes 2 (two) times daily.   SODIUM BICARBONATE 650 MG TABLET    Take 650 mg by mouth 2 (two) times daily. For chronic kidney disease   THIAMINE 100 MG TABLET    Take 100 mg by mouth daily. For supplement  Modified Medications   No medications on file  Discontinued Medications   No medications on file     Allergies  Allergen Reactions  . Aleve [Naproxen Sodium]   . Beta Adrenergic Blockers   . Penicillins   . Sulfa Antibiotics      REVIEW OF SYSTEMS:    GENERAL: no change in appetite, no fatigue, no weight changes, no fever, chills or weakness MOUTH and THROAT: Denies oral discomfort, gingival pain or bleeding, pain from teeth or hoarseness   RESPIRATORY: no cough, SOB, DOE, wheezing, hemoptysis CARDIAC: no chest pain, edema or palpitations GI: no abdominal pain, diarrhea,  constipation, heart burn, nausea or vomiting GU: Denies dysuria, frequency, hematuria, incontinence, or discharge PSYCHIATRIC: Denies feeling of depression or anxiety. No report of hallucinations, insomnia, paranoia, or agitation    PHYSICAL EXAMINATION  GENERAL APPEARANCE: Well nourished. In no acute distress. Normal body habitus SKIN:  Skin is warm and dry.  HEAD: Normal in size and contour. No evidence of trauma MOUTH and THROAT: Lips are without lesions. Oral mucosa is moist and without lesions. RESPIRATORY: breathing is even & unlabored, BS CTAB CARDIAC: RRR, + murmur,no extra heart sounds, no edema GI: abdomen soft, normal BS, no masses, no tenderness, no hepatomegaly, no splenomegaly EXTREMITIES:  Able to move X 4 extremities, BLE has generalized weakness PSYCHIATRIC: Alert to self and time, disoriented to place. Affect and behavior are appropriate    LABS/RADIOLOGY: Labs reviewed: Basic Metabolic Panel:  Recent Labs  12/04/16 01/23/17 01/30/17  NA 143 142 143  K 5.5* 5.6* 5.1  BUN 41* 40* 47*  CREATININE 2.6* 2.4* 2.6*   Liver Function Tests:  Recent Labs  12/04/16  AST 15  ALT 10  ALKPHOS 81   CBC:  Recent Labs  05/15/16 09/22/16 02/25/17  WBC 5.9 5.4 3.9  NEUTROABS  --   --  2  HGB 10.2* 11.2* 9.4*  HCT 33* 37* 29*  PLT 237 175 177   Lipid Panel:  Recent Labs  01/23/17  HDL 37    ASSESSMENT/PLAN:  1. Chronic kidney disease, stage IV (severe) (HCC) -  eGFR 20.81, follow-up consult with nephrology, continue Sodium Bicarb 650 mg BID, check BMP Lab Results  Component Value Date   CREATININE 2.6 (A) 01/30/2017    2. Dementia without behavioral disturbance, unspecified dementia type - Continue Namzaric 14-10 mg daily, continue supportive care, fall precautions   3. H/O: alcohol abuse - no agitation has been reported, continue Folic acid 1 mg 1 tab daily   4. Allergic rhinitis, unspecified seasonality, unspecified trigger - stable, continue  fluticasone 50 g 2 sprays in each nostril daily and cetirizine 10 mg 1 tab daily at bedtime   5. Insomnia - continue Melatonin 3 mg 1 tab Q HS PRN     Goals of care:  Long-term care    Deshane Cotroneo C. [place] - NP    [employer] [phone]

## 2017-03-30 LAB — BASIC METABOLIC PANEL
BUN: 46 — AB (ref 4–21)
Creatinine: 2.6 — AB (ref 0.6–1.3)
GLUCOSE: 66
Potassium: 6.2 — AB (ref 3.4–5.3)
SODIUM: 143 (ref 137–147)

## 2017-05-02 ENCOUNTER — Non-Acute Institutional Stay (SKILLED_NURSING_FACILITY): Payer: Medicare Other | Admitting: Internal Medicine

## 2017-05-02 ENCOUNTER — Encounter: Payer: Self-pay | Admitting: Internal Medicine

## 2017-05-02 DIAGNOSIS — D500 Iron deficiency anemia secondary to blood loss (chronic): Secondary | ICD-10-CM

## 2017-05-02 DIAGNOSIS — E875 Hyperkalemia: Secondary | ICD-10-CM

## 2017-05-02 DIAGNOSIS — N183 Chronic kidney disease, stage 3 unspecified: Secondary | ICD-10-CM

## 2017-05-02 DIAGNOSIS — G4700 Insomnia, unspecified: Secondary | ICD-10-CM | POA: Diagnosis not present

## 2017-05-02 NOTE — Assessment & Plan Note (Signed)
05/02/17 no change in melatonin, but Zyrtec will be changed to loratadine as he was very lethargic at midday

## 2017-05-02 NOTE — Patient Instructions (Addendum)
See assessment and plan under each diagnosis in the problem list and acutely for this visit Total time 27  minutes; greater than 50% of the visit spent counseling patient and coordinating care for problems addressed at this encounter

## 2017-05-02 NOTE — Progress Notes (Signed)
   [facility] Room:129A Code Status:Full Code   This is a nursing facility follow up of chronic medical diagnoses  Interim medical record and care since [facility] visit was updated with review of diagnostic studies and change in clinical status since last visit were documented.  HPI: The patient is a permanent resident of facility with diagnoses of chronic congestive heart failure, essential hypertension, stroke complicated by dysphagia, dementia without behavioral disturbance, chronic kidney disease, protein caloric malnutrition, dyslipidemia, and iron deficiency anemia. He is on melatonin for sleep issues listed as insomnia. He is also on the sedating antihistamine Zyrtec. Renal insufficiency is significant with a BUN of 46 and creatinine of 2.6 on 9/15. At that time potassium was 6.2. He is on no potassium supplements. Anemia have been slightly progressive as of 8/13 with hemoglobin 9.4/hematocrit 29. On low-dose statin, LDL is at goal on 7/11 with a value of 78. In the context of having had a stroke, he continues on low-dose atorvastatin, is not on a beta blocker. He is on a combination of Memantine & Donepezil for his dementia.  Review of systems: Dementia prevented obtaining review of systems. His responses were unintelligible and mainly "uh , uh" Despite anemia, straff have not reported any bleeding dyscrasias. He has had isolated bruising.  Physical exam:  Pertinent or positive findings: Initially he was sleeping but could be aroused. He still was lethargic. He is markedly hard of hearing. He has bilateral ptosis. He is completely edentulous. Large venous nevus over the right lower lip. A grade 1.5 systolic murmur is present @ right base. Breath sounds are decreased. Pedal pulses are decreased. He appears to have pain in the right knee with range of motion testing. There is decreased range of motion of the right knee. He has flexion contractures of  the hands, greater on the right than the left. He has scattered bruising as well as keratoses over the forearms.Limited comprehension with variable command completion.  General appearance:Adequately nourished; no acute distress , increased work of breathing is present.   Lymphatic: No lymphadenopathy about the head, neck, axilla . Eyes: No conjunctival inflammation or lid edema is present. There is no scleral icterus. Ears:  External ear exam shows no significant lesions or deformities.   Nose:  External nasal examination shows no deformity or inflammation. Nasal mucosa are pink and moist without lesions ,exudates Oral exam: lips and gums are healthy appearing.There is no oropharyngeal erythema or exudate . Neck:  No thyromegaly, masses, tenderness noted.    Heart:  Normal rate and regular rhythm. S1 and S2 normal without gallop, click, rub .  Lungs: without wheezes, rhonchi,rales , rubs. Abdomen:Bowel sounds are normal. Abdomen is soft and nontender with no organomegaly, hernias,masses. GU: deferred  Extremities:  No cyanosis, clubbing,edema  Skin: Warm & dry w/o tenting. No significant lesions or rash.  See summary under each active problem in the Problem List with associated updated therapeutic plan

## 2017-05-02 NOTE — Assessment & Plan Note (Signed)
The progression of the anemia warrants recheck

## 2017-05-02 NOTE — Assessment & Plan Note (Signed)
Recheck renal function and potassium

## 2017-05-30 ENCOUNTER — Non-Acute Institutional Stay (SKILLED_NURSING_FACILITY): Payer: Medicare Other | Admitting: Adult Health

## 2017-05-30 ENCOUNTER — Encounter: Payer: Self-pay | Admitting: Adult Health

## 2017-05-30 DIAGNOSIS — E782 Mixed hyperlipidemia: Secondary | ICD-10-CM

## 2017-05-30 DIAGNOSIS — R531 Weakness: Secondary | ICD-10-CM

## 2017-05-30 DIAGNOSIS — N184 Chronic kidney disease, stage 4 (severe): Secondary | ICD-10-CM

## 2017-05-30 DIAGNOSIS — D638 Anemia in other chronic diseases classified elsewhere: Secondary | ICD-10-CM | POA: Diagnosis not present

## 2017-05-30 DIAGNOSIS — G4700 Insomnia, unspecified: Secondary | ICD-10-CM | POA: Diagnosis not present

## 2017-05-30 DIAGNOSIS — F0390 Unspecified dementia without behavioral disturbance: Secondary | ICD-10-CM

## 2017-05-30 NOTE — Progress Notes (Signed)
DATE:  05/30/2017   MRN:  [id number]  BIRTHDAY: 1925/10/25  Facility:  Nursing Home Location:  [facility] Room Number: 129-A  LEVEL OF CARE:  SNF [phone])  Contact Information    Name Relation Home Work [place] Son [phone]  [phone]   Kenric, Ginger   [place]  [phone]        Code Status History    Date Active Date Inactive Code Status Order ID Comments User Context   10/26/2013 18:44 11/16/2013 17:26 DNR [id number]  Ermalinda Memos Inpatient       Chief Complaint  Patient presents with  . Medical Management of Chronic Issues    Routine [facility] visit    HISTORY OF PRESENT ILLNESS:  This is an Elijah Ramos seen for a routine SNF visit.  He is a long-term care resident of [facility].  He has a PMH of deafness, dementia, dysphagia, CVA, and a history of DVT.  He was seen in the room today.  He is currently having PT and OT for therapeutic exercises due to weakness.    PAST MEDICAL HISTORY:  Past Medical History:  Diagnosis Date  . Anemia   . Bilateral chronic knee pain   . CHF (congestive heart failure) ([place])   . CKD (chronic kidney disease)   . Deafness, bilateral    reads lips  . Dementia without behavioral disturbance   . DVT (deep venous thrombosis) ([place])   . Dysphagia   . H/O acute respiratory failure   . H/O: alcohol abuse   . Hyperkalemia   . Hypertension   . Late effects of CVA (cerebrovascular accident)   . Personal history of DVT (deep vein thrombosis)   . PNA (pneumonia)    RLL  . Protein calorie malnutrition ([place])   . Stroke [facility])      CURRENT MEDICATIONS: Reviewed    Medication List        Accurate as of 05/30/17 10:33 AM. Always use your most recent med list.          acetaminophen 500 MG tablet Commonly known as:  TYLENOL   atorvastatin 10 MG tablet Commonly known as:  LIPITOR   bisacodyl 10 MG  suppository Commonly known as:  DULCOLAX   docusate sodium 100 MG capsule Commonly known as:  COLACE   fluticasone 50 MCG/ACT nasal spray Commonly known as:  FLONASE   folic acid 1 MG tablet Commonly known as:  FOLVITE   loratadine 10 MG tablet Commonly known as:  CLARITIN   Melatonin 3 MG Tabs   MILK OF MAGNESIA PO   MULTIPLE MINERALS-VITAMINS PO   NAMZARIC 14-10 MG Cp24 Generic drug:  Memantine HCl-Donepezil HCl   polyethylene glycol packet Commonly known as:  MIRALAX / GLYCOLAX   sodium bicarbonate 650 MG tablet   SYSTANE BALANCE OP   thiamine 100 MG tablet        Allergies  Allergen Reactions  . Aleve [Naproxen Sodium]   . Beta Adrenergic Blockers   . Penicillins   . Sulfa Antibiotics      REVIEW OF SYSTEMS:  GENERAL: no change in appetite, no fatigue, no weight changes, no fever, chills or weakness MOUTH and THROAT: Denies oral discomfort RESPIRATORY: no cough, SOB, DOE, wheezing, hemoptysis CARDIAC: no chest pain, edema or palpitations GI: no abdominal pain, diarrhea, constipation, heart burn, nausea or vomiting PSYCHIATRIC: Denies feeling of depression or anxiety. No report of hallucinations,  insomnia, paranoia, or agitation    PHYSICAL EXAMINATION  GENERAL APPEARANCE: Well nourished. In no acute distress. Normal body habitus EARS: deaf on both ears MOUTH and THROAT: Lips are without lesions. Oral mucosa is moist and without lesions. RESPIRATORY: breathing is even & unlabored, BS CTAB CARDIAC: RRR, + murmur,no extra heart sounds, no edema GI: abdomen soft, normal BS, no masses, no tenderness, no hepatomegaly, no splenomegaly EXTREMITIES:  Able to move X 4 extremities PSYCHIATRIC: Alert to self. Affect and behavior are appropriate   LABS/RADIOLOGY: Labs reviewed: 05/03/17  WBC 5.5 hemoglobin 9.9 hematocrit 32.3 MCV 98.3 platelets 216 glucose 84 calcium 8.5 creatinine 2.41 BUN 46.0 sodium 143 potassium 5.8 CO2 18 eGFR 10.1 Basic Metabolic  Panel: Recent Labs    01/23/17 01/30/17 03/30/17  NA 142 143 143  K 5.6* 5.1 6.2*  BUN 40* 47* 46*  CREATININE 2.4* 2.6* 2.6*   Liver Function Tests: Recent Labs    12/04/16  AST 15  ALT 10  ALKPHOS 81   CBC: Recent Labs    09/22/16 02/22/17 02/25/17  WBC 5.4 4.8 3.9  NEUTROABS  --  3 2  HGB 11.2* 9.9* 9.4*  HCT 37* 31* 29*  PLT 175 175 177   Lipid Panel: Recent Labs    01/23/17  HDL 37    ASSESSMENT/PLAN:  1. Generalized weakness - continue PT and OT, for therapeutic strengthening exercises, fall precautions   2. Chronic kidney disease, stage IV (severe) (HCC) - continue sodium bicarbonate 650 mg 1 tab twice a day Lab Results  Component Value Date   CREATININE 2.6 (A) 03/30/2017    3.  Insomnia, unspecified type - continue melatonin 3 mg 1 tab daily at bedtime when necessary   4. Dementia without behavioral disturbance, unspecified dementia type - continue Namzaric 14-10 mg  daily   5. Mixed hyperlipidemia - continue atorvastatin 10 mg 1/2 tab = 5 mg daily Lab Results  Component Value Date   CHOL 142 01/23/2017   HDL 37 01/23/2017   LDLCALC 78 01/23/2017   TRIG 136 01/23/2017     6. Anemia of chronic disease - stable Lab Results  Component Value Date   HGB 9.4 (A) 02/25/2017      Goals of care:  Long-term care    Jibri Schriefer C. [place] - NP    [employer] [phone]

## 2017-06-17 LAB — BASIC METABOLIC PANEL
BUN: 43 — AB (ref 4–21)
CREATININE: 2.4 — AB (ref 0.6–1.3)
Glucose: 92
Potassium: 5.7 — AB (ref 3.4–5.3)
SODIUM: 143 (ref 137–147)

## 2017-06-18 ENCOUNTER — Non-Acute Institutional Stay (SKILLED_NURSING_FACILITY): Payer: Medicare Other | Admitting: Adult Health

## 2017-06-18 ENCOUNTER — Encounter: Payer: Self-pay | Admitting: Adult Health

## 2017-06-18 DIAGNOSIS — N184 Chronic kidney disease, stage 4 (severe): Secondary | ICD-10-CM

## 2017-06-18 DIAGNOSIS — E875 Hyperkalemia: Secondary | ICD-10-CM

## 2017-06-18 NOTE — Progress Notes (Signed)
Location:  [facility] Room Number: 129-A Place of Service:  SNF (31) Provider:  Durenda Age, NP  Patient Care Team: Hendricks Limes, MD as PCP - General (Internal Medicine) Rehab, [place] ([place])  Extended Emergency Contact Information Primary Emergency Contact: Zuk,[name] Address: [address]          [place],  [address] Johnnette Litter of [place] Phone: [phone] Mobile Phone: [phone] Relation: Son Secondary Emergency Contact: Knobloch,[name]  [place] Mobile Phone: [phone] Relation: Other  Code Status:  Full Code Goals of care: Advanced Directive information Advanced Directives 05/02/2017  Does Patient Have a Medical Advance Directive? Yes  Type of Advance Directive (No Data)  Does patient want to make changes to medical advance directive? No - Patient declined  Copy of [place] in Chart? -  Would patient like information on creating a medical advance directive? -  Pre-existing out of facility DNR order (yellow form or pink MOST form) -     Chief Complaint  Patient presents with  . Acute Visit    Lab results revealing hyperkalemia with a potassium of 5.7     HPI:  Pt is an 83 y.o. male seen today for an acute visit for hyperkalemia with a potassium of 5.7 06/17/17.  He is a long-term care resident of [name].  He has a PMH deafness, dementia, dysphagia, CVA, and a history of DVT. He was seen in the room today. He was seen sleeping and woke up.      Past Medical History:  Diagnosis Date  . Anemia   . Bilateral chronic knee pain   . CHF (congestive heart failure) ([place])   . CKD (chronic kidney disease)   . Deafness, bilateral    reads lips  . Dementia without behavioral disturbance   . DVT (deep venous thrombosis) ([place])   . Dysphagia   . H/O acute respiratory failure   . H/O: alcohol abuse   . Hyperkalemia   .  Hypertension   . Late effects of CVA (cerebrovascular accident)   . Personal history of DVT (deep vein thrombosis)   . PNA (pneumonia)    RLL  . Protein calorie malnutrition ([place])   . Stroke [facility])    History reviewed. No pertinent surgical history.  Allergies  Allergen Reactions  . Aleve [Naproxen Sodium]   . Beta Adrenergic Blockers   . Penicillins   . Sulfa Antibiotics     Outpatient Encounter Medications as of 06/18/2017  Medication Sig  . acetaminophen (TYLENOL) 500 MG tablet Take 1,000 mg by mouth every 8 (eight) hours. Scheduled  . atorvastatin (LIPITOR) 10 MG tablet Take 5 mg by mouth daily. Take 1/2 tablet to = 5 mg for HLD  . bisacodyl (DULCOLAX) 10 MG suppository Place 10 mg rectally as needed for moderate constipation. 1 dose every 24h prn  . docusate sodium (COLACE) 100 MG capsule Take 100 mg by mouth daily. For constipation  . fluticasone (FLONASE) 50 MCG/ACT nasal spray Place 2 sprays into both nostrils daily.   . folic acid (FOLVITE) 1 MG tablet Take 1 mg by mouth daily.  Marland Kitchen loratadine (CLARITIN) 10 MG tablet Take 10 mg daily by mouth.  . Magnesium Hydroxide (MILK OF MAGNESIA PO) If no BM in 3 days, give 30 cc of milk of magnesium by mouth x 1 dose in 24 hour as needed  . Melatonin 3 MG TABS Take 3 mg by mouth  at bedtime as needed.   . Memantine HCl-Donepezil HCl (NAMZARIC) 14-10 MG CP24 Take 1 tablet by mouth daily.  . MULTIPLE MINERALS-VITAMINS PO 1 tablet by mouth for wound healing  . polyethylene glycol (MIRALAX / GLYCOLAX) packet Take 17 g by mouth daily. For bowel health  . Propylene Glycol (SYSTANE BALANCE OP) Place 1 drop into both eyes 2 (two) times daily.  . sodium bicarbonate 650 MG tablet Take 650 mg by mouth 2 (two) times daily. For chronic kidney disease  . sodium polystyrene (KAYEXALATE) 15 GM/60ML suspension Take 15 g by mouth once.  . thiamine 100 MG tablet Take 100 mg by mouth daily. For supplement   No facility-administered encounter medications on  file as of 06/18/2017.     Review of Systems  GENERAL: No change in appetite, no fatigue, no weight changes, no fever, chills or weakness EARS: Deaf on both ears RESPIRATORY: no cough, SOB, DOE, wheezing, hemoptysis CARDIAC: No chest pain, edema or palpitations GI: No abdominal pain, diarrhea, constipation, heart burn, nausea or vomiting PSYCHIATRIC: Denies feelings of depression or anxiety. No report of hallucinations, insomnia, paranoia, or agitation    Immunization History  Administered Date(s) Administered  . Influenza-Unspecified 04/27/2015, 04/09/2016, 04/20/2016  . PPD Test 11/16/2013   Pertinent  Health Maintenance Due  Topic Date Due  . INFLUENZA VACCINE  10/13/2017 (Originally 02/13/2017)  . PNA vac Low Risk Adult (1 of 2 - PCV13) 09/30/2043 (Originally 12/17/1992)   Fall Risk  02/05/2017 08/17/2016 05/18/2016 03/30/2016 03/09/2016  Falls in the past year? No Yes Yes No No  Number falls in past yr: - 1 1 - -  Injury with Fall? - No No - -      Vitals:   06/18/17 1158  BP: 138/74  Pulse: 74  Resp: 20  Temp: 98.3 F (36.8 C)  TempSrc: Oral  SpO2: 91%  Weight: 163 lb 6.4 oz (74.1 kg)  Height: _0  (1.753 m)   Body mass index is 24.13 kg/m.   Physical Exam  GENERAL APPEARANCE: Well nourished. In no acute distress. Normal body habitus SKIN:  Skin is warm and dry.  MOUTH and THROAT: Lips are without lesions. Oral mucosa is moist and without lesions.  RESPIRATORY: Breathing is even & unlabored, BS CTAB CARDIAC: RRR, + murmur,no extra heart sounds, no edema GI: Abdomen soft, normal BS, no masses, no tenderness EXTREMITIES:  Able to move X 4 extremities PSYCHIATRIC: Alert to self. Affect and behavior are appropriate   Labs reviewed: Recent Labs    01/23/17 01/30/17 03/30/17  NA 142 143 143  K 5.6* 5.1 6.2*  BUN 40* 47* 46*  CREATININE 2.4* 2.6* 2.6*   Recent Labs    12/04/16  AST 15  ALT 10  ALKPHOS 81   Recent Labs    09/22/16 02/22/17 02/25/17  WBC  5.4 4.8 3.9  NEUTROABS  --  3 2  HGB 11.2* 9.9* 9.4*  HCT 37* 31* 29*  PLT 175 175 177   Lab Results  Component Value Date   TSH 0.76 11/30/2015    Lab Results  Component Value Date   CHOL 142 01/23/2017   HDL 37 01/23/2017   LDLCALC 78 01/23/2017   TRIG 136 01/23/2017     Assessment/Plan  1. Hyperkalemia, diminished renal excretion - K 5.7, give Kayexalate  15 g/60 ml PO X 1   2. CKD, stage IV - eGFR 23.20, referred to nephrologist, BMP in AM    Aarush Stukey C. [place] - NP [place]  [facility] [phone]

## 2017-06-19 LAB — BASIC METABOLIC PANEL
BUN: 40 — AB (ref 4–21)
Creatinine: 2 — AB (ref 0.6–1.3)
Glucose: 83
POTASSIUM: 5.6 — AB (ref 3.4–5.3)
Sodium: 141 (ref 137–147)

## 2017-06-21 LAB — BASIC METABOLIC PANEL
BUN: 41 — AB (ref 4–21)
Creatinine: 2.1 — AB (ref 0.6–1.3)
GLUCOSE: 96
POTASSIUM: 4.4 (ref 3.4–5.3)
SODIUM: 143 (ref 137–147)

## 2017-07-01 ENCOUNTER — Encounter: Payer: Self-pay | Admitting: Adult Health

## 2017-07-01 ENCOUNTER — Non-Acute Institutional Stay (SKILLED_NURSING_FACILITY): Payer: Medicare Other | Admitting: Adult Health

## 2017-07-01 DIAGNOSIS — G4700 Insomnia, unspecified: Secondary | ICD-10-CM | POA: Diagnosis not present

## 2017-07-01 DIAGNOSIS — N184 Chronic kidney disease, stage 4 (severe): Secondary | ICD-10-CM

## 2017-07-01 DIAGNOSIS — H9193 Unspecified hearing loss, bilateral: Secondary | ICD-10-CM

## 2017-07-01 DIAGNOSIS — F0390 Unspecified dementia without behavioral disturbance: Secondary | ICD-10-CM

## 2017-07-01 DIAGNOSIS — F1011 Alcohol abuse, in remission: Secondary | ICD-10-CM

## 2017-07-01 DIAGNOSIS — E782 Mixed hyperlipidemia: Secondary | ICD-10-CM

## 2017-07-01 DIAGNOSIS — Z87898 Personal history of other specified conditions: Secondary | ICD-10-CM | POA: Diagnosis not present

## 2017-07-01 DIAGNOSIS — D638 Anemia in other chronic diseases classified elsewhere: Secondary | ICD-10-CM

## 2017-07-01 DIAGNOSIS — J309 Allergic rhinitis, unspecified: Secondary | ICD-10-CM

## 2017-07-01 NOTE — Progress Notes (Signed)
Location:  [facility] Room Number: 129-A Place of Service:  SNF (31) Provider:  Durenda Age, NP  Patient Care Team: Hendricks Limes, MD as PCP - General (Internal Medicine) Rehab, [place] ([place])  Extended Emergency Contact Information Primary Emergency Contact: Leabo,[name] Address: [address]          [place], [place] [address] Johnnette Litter of [place] Phone: [phone] Mobile Phone: [phone] Relation: Son Secondary Emergency Contact: Milson,[name]  [place] Mobile Phone: [phone] Relation: Other  Code Status:  Full Code  Goals of care: Advanced Directive information Advanced Directives 05/02/2017  Does Patient Have a Medical Advance Directive? Yes  Type of Advance Directive (No Data)  Does patient want to make changes to medical advance directive? No - Patient declined  Copy of [place] in Chart? -  Would patient like information on creating a medical advance directive? -  Pre-existing out of facility DNR order (yellow form or pink MOST form) -     Chief Complaint  Patient presents with  . Medical Management of Chronic Issues    Routine [facility] visit    HPI:  Pt is a 83 y.o. male seen today for medical management of chronic diseases.  He is a long-term care resident of [facility].  He has a PMH of dementia, deafness, dysphagia, CVA, and a history of DVT. He was seen in the dining room and was brought to his room. He is bilaterally deaf. He was recently treated for hyperkalemia and was given Kayexalate. Latest K 4.4, normal.     Past Medical History:  Diagnosis Date  . Anemia   . Bilateral chronic knee pain   . CHF (congestive heart failure) ([place])   . CKD (chronic kidney disease)   . Deafness, bilateral    reads lips  . Dementia without behavioral disturbance   . DVT (deep venous thrombosis) ([place])   . Dysphagia     . H/O acute respiratory failure   . H/O: alcohol abuse   . Hyperkalemia   . Hypertension   . Late effects of CVA (cerebrovascular accident)   . Personal history of DVT (deep vein thrombosis)   . PNA (pneumonia)    RLL  . Protein calorie malnutrition ([place])   . Stroke [facility])    History reviewed. No pertinent surgical history.  Allergies  Allergen Reactions  . Aleve [Naproxen Sodium]   . Beta Adrenergic Blockers   . Penicillins   . Sulfa Antibiotics     Outpatient Encounter Medications as of 07/01/2017  Medication Sig  . acetaminophen (TYLENOL) 500 MG tablet Take 1,000 mg by mouth every 8 (eight) hours. Scheduled  . atorvastatin (LIPITOR) 10 MG tablet Take 5 mg by mouth daily. Take 1/2 tablet to = 5 mg for HLD  . bisacodyl (DULCOLAX) 10 MG suppository Place 10 mg rectally as needed for moderate constipation. 1 dose every 24h prn  . docusate sodium (COLACE) 100 MG capsule Take 100 mg by mouth daily. For constipation  . fluticasone (FLONASE) 50 MCG/ACT nasal spray Place 2 sprays into both nostrils daily.   . folic acid (FOLVITE) 1 MG tablet Take 1 mg by mouth daily.  Marland Kitchen loratadine (CLARITIN) 10 MG tablet Take 10 mg daily by mouth.  . Magnesium Hydroxide (MILK OF MAGNESIA PO) If no BM in 3 days, give 30 cc of milk of magnesium by mouth x 1 dose in 24 hour as needed  .  Melatonin 3 MG TABS Take 3 mg by mouth at bedtime as needed.   . Memantine HCl-Donepezil HCl (NAMZARIC) 14-10 MG CP24 Take 1 tablet by mouth daily.  . MULTIPLE MINERALS-VITAMINS PO 1 tablet by mouth for wound healing  . polyethylene glycol (MIRALAX / GLYCOLAX) packet Take 17 g by mouth daily. For bowel health  . Propylene Glycol (SYSTANE BALANCE OP) Place 1 drop into both eyes 2 (two) times daily.  . sodium bicarbonate 650 MG tablet Take 650 mg by mouth 2 (two) times daily. For chronic kidney disease  . thiamine 100 MG tablet Take 100 mg by mouth daily. For supplement   No facility-administered encounter medications on  file as of 07/01/2017.     Review of Systems  GENERAL: No change in appetite, no fatigue, no weight changes, no fever, chills or weakness EARS:  Bilaterally deaf MOUTH and THROAT: Denies oral discomfort, gingival pain or bleeding RESPIRATORY: no cough, SOB, DOE, wheezing, hemoptysis CARDIAC: No chest pain, edema or palpitations GI: No abdominal pain, diarrhea, constipation, heart burn, nausea or vomiting GU: Denies dysuria, frequency, hematuria, incontinence, or discharge PSYCHIATRIC: Denies feelings of depression or anxiety. No report of hallucinations, insomnia, paranoia, or agitation   Immunization History  Administered Date(s) Administered  . Influenza-Unspecified 04/27/2015, 04/09/2016, 04/20/2016  . PPD Test 11/16/2013   Pertinent  Health Maintenance Due  Topic Date Due  . INFLUENZA VACCINE  10/13/2017 (Originally 02/13/2017)  . PNA vac Low Risk Adult (1 of 2 - PCV13) 09/30/2043 (Originally 12/17/1992)   Fall Risk  02/05/2017 08/17/2016 05/18/2016 03/30/2016 03/09/2016  Falls in the past year? No Yes Yes No No  Number falls in past yr: - 1 1 - -  Injury with Fall? - No No - -      Vitals:   07/01/17 1302  BP: 125/64  Pulse: 74  Resp: 18  Temp: (!) 97.3 F (36.3 C)  TempSrc: Oral  SpO2: 91%  Weight: 165 lb 6.4 oz (75 kg)  Height: '5\' 9"'  (1.753 m)   Body mass index is 24.43 kg/m.  Physical Exam  GENERAL APPEARANCE: Well nourished. In no acute distress. Normal body habitus SKIN:  Skin is warm and dry.  EARS: Pinnae are normal. Bilaterally deaf MOUTH and THROAT: Lips are without lesions. Oral mucosa is moist and without lesions. RESPIRATORY: Breathing is even & unlabored, BS CTAB CARDIAC: RRR, + murmur,no extra heart sounds, no edema GI: Abdomen soft, normal BS, no masses, no tenderness EXTREMITIES: Able to move X 4 extremities PSYCHIATRIC: Alert to self, disoriented to time and place. Affect and behavior are appropriate   Labs reviewed: 06/21/17  glucose 96 calcium  8.2 creatinine 2.12 BUN 40.6 sodium 143 K4.4 eGFR 26.69 05/03/17  WBC 5.5 hemoglobin 9.9 hematocrit 32.3 MCV 98.3 platelet 216 Recent Labs    06/17/17 06/19/17 06/21/17  NA 143 141 143  K 5.7* 5.6* 4.4  BUN 43* 40* 41*  CREATININE 2.4* 2.0* 2.1*   Recent Labs    12/04/16  AST 15  ALT 10  ALKPHOS 81   Recent Labs    09/22/16 02/22/17 02/25/17  WBC 5.4 4.8 3.9  NEUTROABS  --  3 2  HGB 11.2* 9.9* 9.4*  HCT 37* 31* 29*  PLT 175 175 177   Lab Results  Component Value Date   TSH 0.76 11/30/2015    Lab Results  Component Value Date   CHOL 142 01/23/2017   HDL 37 01/23/2017   LDLCALC 78 01/23/2017   TRIG 136 01/23/2017  Assessment/Plan  1. Chronic kidney disease, stage IV (severe) (HCC) -  continue sodium bicarbonate 650 mg 1 tablet twice a day Lab Results  Component Value Date   CREATININE 2.1 (A) 06/21/2017     2. Dementia without behavioral disturbance, unspecified dementia type - continue Namzaric 14-10  mg Daily   3. Insomnia, unspecified type - continue melatonin 3 mg 1 tab daily at bedtime when necessary   4. Allergic rhinitis, unspecified seasonality, unspecified trigger - stable change fluticasone  Will be changed to PRN   5. Deafness, bilateral - encourage use of communication board, encourage staff to communicate while communicating with residentent   6. Mixed hyperlipidemia - Continue atorvastatin 10 mg 1/2 tab = 5 mg daily at bedtime  Lab Results  Component Value Date   CHOL 142 01/23/2017   HDL 37 01/23/2017   LDLCALC 78 01/23/2017   TRIG 136 01/23/2017     7. Anemia of chronic disease  - hgb 9.9, stable    8. H/O: alcohol abuse - continue vitamin B1 100 mg 1 tab daily, folic acid 1 mg 1 tab daily      Family/ staff Communication: Discussed plan of care with charge nurse.  Labs/tests ordered:  None  Goals of care:   Long-term care    Durenda Age, NP [facility] [phone]  (Monday-Friday 8:00 a.m. - 5:00 p.m.) [phone] (after hours)

## 2017-08-05 ENCOUNTER — Encounter: Payer: Self-pay | Admitting: Adult Health

## 2017-08-05 ENCOUNTER — Non-Acute Institutional Stay (SKILLED_NURSING_FACILITY): Payer: Medicare Other | Admitting: Adult Health

## 2017-08-05 DIAGNOSIS — N184 Chronic kidney disease, stage 4 (severe): Secondary | ICD-10-CM

## 2017-08-05 DIAGNOSIS — F0390 Unspecified dementia without behavioral disturbance: Secondary | ICD-10-CM | POA: Diagnosis not present

## 2017-08-05 DIAGNOSIS — D638 Anemia in other chronic diseases classified elsewhere: Secondary | ICD-10-CM

## 2017-08-05 DIAGNOSIS — H9193 Unspecified hearing loss, bilateral: Secondary | ICD-10-CM

## 2017-08-05 DIAGNOSIS — H04123 Dry eye syndrome of bilateral lacrimal glands: Secondary | ICD-10-CM

## 2017-08-05 DIAGNOSIS — E782 Mixed hyperlipidemia: Secondary | ICD-10-CM

## 2017-08-05 DIAGNOSIS — Z87898 Personal history of other specified conditions: Secondary | ICD-10-CM | POA: Diagnosis not present

## 2017-08-05 DIAGNOSIS — F1011 Alcohol abuse, in remission: Secondary | ICD-10-CM

## 2017-08-05 NOTE — Progress Notes (Signed)
Location:  [facility] Room Number: 129-A Place of Service:  SNF (31) Provider:  Kenard Gower, NP  Patient Care Team: Pecola Lawless, MD as PCP - General (Internal Medicine) Rehab, [facility]  Extended Emergency Contact Information Primary Emergency Contact: Gfeller,[name] Address: [address]          [place], [place] [address] [name] of [place] Home Phone: [phone] Mobile Phone: [phone] Relation: Son Secondary Emergency Contact: Giuliani,[name]  [place] Mobile Phone: [phone] Relation: Other  Code Status:  Full Code  Goals of care: Advanced Directive information Advanced Directives 05/02/2017  Does Patient Have a Medical Advance Directive? Yes  Type of Advance Directive (No Data)  Does patient want to make changes to medical advance directive? No - Patient declined  Copy of Healthcare Power of Attorney in Chart? -  Would patient like information on creating a medical advance directive? -  Pre-existing out of facility DNR order (yellow form or pink MOST form) -     Chief Complaint  Patient presents with  . Medical Management of Chronic Issues    Routine [facility] visit    HPI:  Pt is an 84 y.o. male seen today for medical management of chronic diseases.  He is a long-term care resident of [facility].  He has a PMH of dementia, deafness, dysphagia, CVA, and a history of DVT. He was seen in his room today. He is deaf on both ears, has good eye contact and follows gestures.    Past Medical History:  Diagnosis Date  . Anemia   . Bilateral chronic knee pain   . CHF (congestive heart failure) ([facility])   . CKD (chronic kidney disease)   . Deafness, bilateral    reads lips  . Dementia without behavioral disturbance   . DVT (deep venous thrombosis) ([facility])   . Dysphagia   . H/O acute respiratory failure   . H/O: alcohol abuse   . Hyperkalemia    . Hypertension   . Late effects of CVA (cerebrovascular accident)   . Personal history of DVT (deep vein thrombosis)   . PNA (pneumonia)    RLL  . Protein calorie malnutrition ([facility])   . Stroke [facility])    History reviewed. No pertinent surgical history.  Allergies  Allergen Reactions  . Aleve [Naproxen Sodium]   . Beta Adrenergic Blockers   . Penicillins   . Sulfa Antibiotics     Outpatient Encounter Medications as of 08/05/2017  Medication Sig  . acetaminophen (TYLENOL) 500 MG tablet Take 1,000 mg by mouth every 8 (eight) hours. Scheduled  . atorvastatin (LIPITOR) 10 MG tablet Take 5 mg by mouth daily. Take 1/2 tablet to = 5 mg for HLD  . bisacodyl (DULCOLAX) 10 MG suppository Place 10 mg rectally as needed for moderate constipation. 1 dose every 24h prn  . docusate sodium (COLACE) 100 MG capsule Take 100 mg by mouth daily. For constipation  . fluticasone (FLONASE) 50 MCG/ACT nasal spray Place 2 sprays into both nostrils daily.   . folic acid (FOLVITE) 1 MG tablet Take 1 mg by mouth daily.  Marland Kitchen loratadine (CLARITIN) 10 MG tablet Take 10 mg daily by mouth.  . Magnesium Hydroxide (MILK OF MAGNESIA PO) If no BM in 3 days, give 30 cc of milk of magnesium by mouth x 1 dose in 24 hour as needed  . Melatonin 3 MG TABS Take 3 mg by mouth at bedtime as  needed.   . Memantine HCl-Donepezil HCl (NAMZARIC) 14-10 MG CP24 Take 1 tablet by mouth daily.  . MULTIPLE MINERALS-VITAMINS PO 1 tablet by mouth for wound healing  . polyethylene glycol (MIRALAX / GLYCOLAX) packet Take 17 g by mouth daily. For bowel health  . Propylene Glycol (SYSTANE BALANCE OP) Place 1 drop into both eyes 2 (two) times daily.  . sodium bicarbonate 650 MG tablet Take 650 mg by mouth 2 (two) times daily. For chronic kidney disease  . thiamine 100 MG tablet Take 100 mg by mouth daily. For supplement   No facility-administered encounter medications on file as of 08/05/2017.     Review of Systems Unable to obtain due to  deafness on both ears    Immunization History  Administered Date(s) Administered  . Influenza-Unspecified 04/27/2015, 04/09/2016, 04/20/2016  . PPD Test 11/16/2013   Pertinent  Health Maintenance Due  Topic Date Due  . INFLUENZA VACCINE  10/13/2017 (Originally 02/13/2017)  . PNA vac Low Risk Adult (1 of 2 - PCV13) 09/30/2043 (Originally 12/17/1992)   Fall Risk  02/05/2017 08/17/2016 05/18/2016 03/30/2016 03/09/2016  Falls in the past year? No Yes Yes No No  Number falls in past yr: - 1 1 - -  Injury with Fall? - No No - -      Physical Exam  GENERAL APPEARANCE: Well nourished. In no acute distress. Normal body habitus SKIN:  Skin is warm and dry.  MOUTH and THROAT: Lips are without lesions. Oral mucosa is moist and without lesions.  RESPIRATORY: Breathing is even & unlabored, BS CTAB CARDIAC: RRR, + murmur,no extra heart sounds, no edema GI: Abdomen soft, normal BS, no masses, no tenderness, no hepatomegaly, no splenomegaly EXTREMITIES:  Able to move X 4 extremities PSYCHIATRIC: Alert and oriented X 3. Affect and behavior are appropriate   Labs reviewed: 05/03/17  WBC 5.5 hemoglobin 9.9 hematocrit 32.3 MCV 98.3 platelets 216 Recent Labs    06/17/17 06/19/17 06/21/17  NA 143 141 143  K 5.7* 5.6* 4.4  BUN 43* 40* 41*  CREATININE 2.4* 2.0* 2.1*   Recent Labs    12/04/16  AST 15  ALT 10  ALKPHOS 81   Recent Labs    09/22/16 02/22/17 02/25/17  WBC 5.4 4.8 3.9  NEUTROABS  --  3 2  HGB 11.2* 9.9* 9.4*  HCT 37* 31* 29*  PLT 175 175 177   Lab Results  Component Value Date   TSH 0.76 11/30/2015    Lab Results  Component Value Date   CHOL 142 01/23/2017   HDL 37 01/23/2017   LDLCALC 78 01/23/2017   TRIG 136 01/23/2017    Assessment/Plan  1. Mixed hyperlipidemia - continue atorvastatin 10 mg 1/2 tab = 5 mg daily Lab Results  Component Value Date   CHOL 142 01/23/2017   HDL 37 01/23/2017   LDLCALC 78 01/23/2017   TRIG 136 01/23/2017     2. Anemia of chronic  disease - hgb  9.9, stable   3. Chronic kidney disease, stage IV (severe) (HCC) - continue Sodium Bicarb 650 mg 1 tab BID Lab Results  Component Value Date   CREATININE 2.1 (A) 06/21/2017     4. Deafness, bilateral - continue to use gestures, writing as way to communicate   5. H/O: alcohol abuse - continue Folic acid 1 mg daily and Vitamin B1 100 mg 1 tab daily   6. Dementia without behavioral disturbance, unspecified dementia type - continue supportive care and fall precautions, continue Namzaric 14  mg-10 mg daily   7. Dry eyes - continue Systane Balance 0.6% 1 gtt on each eye BID     Family/ staff Communication: Discussed plan of care with charge nurse.  Labs/tests ordered:  None  Goals of care:   Long-term care   Kenard GowerMonina Medina-Vargas, NP [facility] [phone] (Monday-Friday 8:00 a.m. - 5:00 p.m.) [phone] (after hours)

## 2017-09-13 DEATH — deceased
